# Patient Record
Sex: Female | Born: 1975 | Race: Black or African American | Hispanic: No | Marital: Single | Smoking: Never smoker
Health system: Southern US, Community
[De-identification: ages and names within clinical notes are randomized; demographics above are authoritative.]

## PROBLEM LIST (undated history)

## (undated) ENCOUNTER — Ambulatory Visit (HOSPITAL_COMMUNITY): Payer: 59

## (undated) DIAGNOSIS — F32A Depression, unspecified: Secondary | ICD-10-CM

## (undated) DIAGNOSIS — Z8659 Personal history of other mental and behavioral disorders: Secondary | ICD-10-CM

## (undated) DIAGNOSIS — I10 Essential (primary) hypertension: Secondary | ICD-10-CM

## (undated) DIAGNOSIS — M069 Rheumatoid arthritis, unspecified: Secondary | ICD-10-CM

## (undated) DIAGNOSIS — Z923 Personal history of irradiation: Secondary | ICD-10-CM

## (undated) DIAGNOSIS — R319 Hematuria, unspecified: Secondary | ICD-10-CM

## (undated) DIAGNOSIS — C50919 Malignant neoplasm of unspecified site of unspecified female breast: Secondary | ICD-10-CM

## (undated) DIAGNOSIS — F329 Major depressive disorder, single episode, unspecified: Secondary | ICD-10-CM

## (undated) DIAGNOSIS — Z8639 Personal history of other endocrine, nutritional and metabolic disease: Secondary | ICD-10-CM

## (undated) DIAGNOSIS — A6000 Herpesviral infection of urogenital system, unspecified: Secondary | ICD-10-CM

## (undated) DIAGNOSIS — F419 Anxiety disorder, unspecified: Secondary | ICD-10-CM

## (undated) DIAGNOSIS — R3915 Urgency of urination: Secondary | ICD-10-CM

## (undated) DIAGNOSIS — N200 Calculus of kidney: Secondary | ICD-10-CM

## (undated) DIAGNOSIS — G500 Trigeminal neuralgia: Secondary | ICD-10-CM

## (undated) HISTORY — DX: Personal history of other endocrine, nutritional and metabolic disease: Z86.39

## (undated) HISTORY — PX: BREAST LUMPECTOMY: SHX2

## (undated) HISTORY — PX: ANTERIOR CERVICAL DECOMP/DISCECTOMY FUSION: SHX1161

## (undated) HISTORY — DX: Herpesviral infection of urogenital system, unspecified: A60.00

## (undated) HISTORY — PX: ABDOMINAL HYSTERECTOMY: SHX81

---
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
## 1998-04-18 ENCOUNTER — Emergency Department (HOSPITAL_COMMUNITY): Admission: EM | Admit: 1998-04-18 | Discharge: 1998-04-18 | Payer: Self-pay | Admitting: Emergency Medicine

## 1999-05-19 ENCOUNTER — Other Ambulatory Visit: Admission: RE | Admit: 1999-05-19 | Discharge: 1999-05-19 | Payer: Self-pay | Admitting: Emergency Medicine

## 1999-08-18 ENCOUNTER — Encounter: Payer: Self-pay | Admitting: Emergency Medicine

## 1999-08-18 ENCOUNTER — Emergency Department (HOSPITAL_COMMUNITY): Admission: EM | Admit: 1999-08-18 | Discharge: 1999-08-18 | Payer: Self-pay | Admitting: Emergency Medicine

## 2000-01-10 ENCOUNTER — Emergency Department (HOSPITAL_COMMUNITY): Admission: EM | Admit: 2000-01-10 | Discharge: 2000-01-10 | Payer: Self-pay | Admitting: Podiatry

## 2000-02-19 ENCOUNTER — Other Ambulatory Visit: Admission: RE | Admit: 2000-02-19 | Discharge: 2000-02-19 | Payer: Self-pay | Admitting: Emergency Medicine

## 2000-04-04 ENCOUNTER — Encounter: Payer: Self-pay | Admitting: Emergency Medicine

## 2000-04-04 ENCOUNTER — Emergency Department (HOSPITAL_COMMUNITY): Admission: EM | Admit: 2000-04-04 | Discharge: 2000-04-04 | Payer: Self-pay | Admitting: Emergency Medicine

## 2000-04-05 ENCOUNTER — Encounter: Payer: Self-pay | Admitting: Emergency Medicine

## 2000-04-05 ENCOUNTER — Inpatient Hospital Stay (HOSPITAL_COMMUNITY): Admission: AD | Admit: 2000-04-05 | Discharge: 2000-04-09 | Payer: Self-pay | Admitting: Emergency Medicine

## 2001-02-03 DIAGNOSIS — Z8639 Personal history of other endocrine, nutritional and metabolic disease: Secondary | ICD-10-CM

## 2001-02-03 HISTORY — DX: Personal history of other endocrine, nutritional and metabolic disease: Z86.39

## 2001-02-20 ENCOUNTER — Ambulatory Visit (HOSPITAL_COMMUNITY): Admission: RE | Admit: 2001-02-20 | Discharge: 2001-02-20 | Payer: Self-pay | Admitting: Emergency Medicine

## 2001-02-20 ENCOUNTER — Encounter: Payer: Self-pay | Admitting: Emergency Medicine

## 2001-02-24 ENCOUNTER — Encounter: Admission: RE | Admit: 2001-02-24 | Discharge: 2001-02-24 | Payer: Self-pay | Admitting: Emergency Medicine

## 2001-02-24 ENCOUNTER — Encounter: Payer: Self-pay | Admitting: Emergency Medicine

## 2001-03-24 ENCOUNTER — Encounter: Payer: Self-pay | Admitting: Internal Medicine

## 2001-03-24 ENCOUNTER — Ambulatory Visit (HOSPITAL_COMMUNITY): Admission: RE | Admit: 2001-03-24 | Discharge: 2001-03-24 | Payer: Self-pay | Admitting: Internal Medicine

## 2001-07-01 ENCOUNTER — Other Ambulatory Visit: Admission: RE | Admit: 2001-07-01 | Discharge: 2001-07-01 | Payer: Self-pay | Admitting: Emergency Medicine

## 2004-04-17 ENCOUNTER — Emergency Department (HOSPITAL_COMMUNITY): Admission: EM | Admit: 2004-04-17 | Discharge: 2004-04-17 | Payer: Self-pay | Admitting: Emergency Medicine

## 2004-04-26 ENCOUNTER — Encounter: Admission: RE | Admit: 2004-04-26 | Discharge: 2004-04-26 | Payer: Self-pay | Admitting: Emergency Medicine

## 2005-04-27 ENCOUNTER — Emergency Department (HOSPITAL_COMMUNITY): Admission: EM | Admit: 2005-04-27 | Discharge: 2005-04-27 | Payer: Self-pay | Admitting: Emergency Medicine

## 2005-06-27 ENCOUNTER — Other Ambulatory Visit: Admission: RE | Admit: 2005-06-27 | Discharge: 2005-06-27 | Payer: Self-pay | Admitting: Obstetrics and Gynecology

## 2006-07-02 ENCOUNTER — Other Ambulatory Visit: Admission: RE | Admit: 2006-07-02 | Discharge: 2006-07-02 | Payer: Self-pay | Admitting: Obstetrics and Gynecology

## 2007-11-06 HISTORY — PX: KNEE ARTHROSCOPY: SUR90

## 2008-03-16 ENCOUNTER — Encounter: Admission: RE | Admit: 2008-03-16 | Discharge: 2008-03-16 | Payer: Self-pay | Admitting: Emergency Medicine

## 2008-04-22 ENCOUNTER — Ambulatory Visit: Payer: Self-pay | Admitting: Vascular Surgery

## 2008-04-22 ENCOUNTER — Encounter (INDEPENDENT_AMBULATORY_CARE_PROVIDER_SITE_OTHER): Payer: Self-pay | Admitting: Orthopedic Surgery

## 2008-04-22 ENCOUNTER — Ambulatory Visit: Admission: RE | Admit: 2008-04-22 | Discharge: 2008-04-22 | Payer: Self-pay | Admitting: Orthopedic Surgery

## 2010-11-26 ENCOUNTER — Encounter: Payer: Self-pay | Admitting: Emergency Medicine

## 2011-03-23 NOTE — Discharge Summary (Signed)
[place]. [facility]  Patient:    Joanne Garrett, Joanne Garrett                   MRN: [id number] Adm. Date:  [id number] Disc. Date: [id number] Attending:  Roque Lias                           Discharge Summary  SUMMARY OF HISTORY AND PHYSICAL:  The patient is a 35 year old female who had had a one-week history of left CVA pain and left flank pain which gradually became more severe.  Because of the severity of the pain, she went to the emergency room the night before admission where a chest x-ray was done which was normal.  On the day of admission, the pain was even more severe and she also noted fever, chills, nausea and hematuria.  She denied any cough, sputum production, shortness of breath, vomiting, dysuria, urinary frequency, urinary urgency, diarrhea or blood in the stool.  She had had no GYN complaints and her last menstrual period was Mar 25, 2000.  She has no prior history of urinary tract infections or stones.  PHYSICAL EXAMINATION:  Blood pressure 108/74 mmHg, pulse 126 and regular, respirations 20, and temperature 104.5 degrees.  She appeared alert and in no distress.  SKIN:  Clear.  HEENT: PERRL.  Fundi were benign.  ENT was unremarkable.  NECK:  Supple.  There was no adenopathy.  LUNGS:  Clear.  CARDIAC:  Rhythm was regular with gallop or murmur.  There was no CVA tenderness.  ABDOMEN:  She had moderate left flank tenderness on abdominal examination without guarding or rebound.  Both lower quadrants were soft and nontender. There was no hepatosplenomegaly or mass.  Bowel sounds were normally active. She had no edema.  Pulses were full.  NEUROLOGIC:  Within normal limits.  ADMITTING IMPRESSION:  Left flank pain with fever, probably acute pyelonephritis.  SUMMARY OF LABORATORY DATA:  White count 26,800, hemoglobin 11.7.  Sodium 129, potassium 3.3, chloride 95, BUN 10, creatinine 1.0.  Her UA showed too numerous to count wbcs and  rbcs, specific gravity greater than 1.040, pH 6.5, glucose negative, large hemoglobin, 30 mg/dL protein, moderate leukocyte esterase.  One of two blood cultures grew out Escherichia coli.  A urine culture was also ordered but was apparently never done.  HOSPITAL COURSE:  The patient was admitted to the 5700 floor.  Vital signs were obtained every 4 hours.  She was allowed to be up ad lib, given a clear liquid diet, and a saline lock was inserted.  An unenhanced renal CT scan was done and she was begun on Levaquin 500 mg IV once per day and was given morphine for pain and Tylenol for fever.  The following morning she felt a little better.  She had less abdominal pain.  She was tolerating clear liquids well.  Her temperature was 102.3 degrees.  IV Levaquin was continued.  Over the next several days her temperature gradually came down.  Her left CVA pain gradually improved.  She was advanced to a regular diet which she tolerated well.  As of April 08, 2000 she was advanced to p.o. Levaquin and she tolerated this as well.  As of April 09, 2000 she had minimal left CVA pain, no nausea or vomiting.  She was eating a regular diet and ambulating in the hall, tolerating p.o. Levaquin.  Her temperature was 100.9 degrees.  Lungs were clear.  Cardiac rhythm was regular without murmur.  Abdomen was soft and nontender.  Her blood culture at that time was growing out E. coli.  It was felt that she could be discharged on that date on Levaquin.  FINAL DIAGNOSES: 1.  Escherichia coli sepsis. 2.  Acute pyelonephritis. 3.  Hyponatremia. 4.  Hypokalemia.  DISCHARGE MEDICATIONS:  Levaquin 500 mg once per day.  ACTIVITY:  May return to work on April 12, 2000.  DIET:  No restrictions.  FOLLOWUP:  Return to see me in 2 weeks. DD:  04/20/00 TD:  04/23/00 Job: [id number]

## 2011-03-23 NOTE — Discharge Summary (Signed)
[place]. [facility]  Patient:    Joanne Garrett, Joanne Garrett                   MRN: [id number] Adm. Date:  [id number] Disc. Date: [id number] Attending:  Roque Lias                           Discharge Summary  HISTORY OF PRESENT ILLNESS:  The patient is a 35 year old female who had had a one-week history of left CVA and left flank pain which had gradually become more severe.  Because of the severity of the pain, she went to the emergency room last night where her chest x-ray was normal.  On the day of admission, the pain was even more severe and she noted also fever, chills, nausea, and hematuria.  She denied any urinary frequency, urgency, or dysuria.  No prior history of urinary tract infections or stones.  PHYSICAL EXAMINATION:  VITAL SIGNS:  Blood pressure 108/74, pulse 126 and regular, respirations 20, temperature 104.5.  GENERAL:  She appeared alert and was in no distress.  SKIN:  Warm and dry without rash.  HEENT:  PERL, full EOMs, fundi benign.   ENT unremarkable.  NECK:  Supple without adenopathy.  LUNGS:  Clear.  CARDIAC:  Rhythm was regular without gallop or murmur.  ABDOMEN:  She had no CVA tenderness.  She had moderate left flank tenderness without guarding or rebound.  Both lower quadrants were soft and nontender. There was no hepatosplenomegaly or mass.  Bowel sounds were present.  EXTREMITIES:  She had no edema.  Pulses were full.  Neurological exam was normal.  ADMITTING IMPRESSION:  Left flank pain with fever, probably acute pyelonephritis.  LABORATORY DATA:  CT of the abdomen and pelvis revealed no evidence of kidney stones.  There was a striated appearance of the left kidney consistent with pyelonephritis.  CT of the pelvis was normal.  Blood gases on room air revealed pH 7.449, PCO2 34.5, and PO2 94.0.  White count was 26.800, hemoglobin 11.7.  Sodium was 129, potassium 3.3, chloride 95, BUN 10, creatinine 1.0, glucose  106.  UA showed too numerous to count WBCs and RBCs.  One of two blood cultures grew out E. coli sensitive to all antibiotics tested.  A urine culture was ordered but was not done.  HOSPITAL COURSE:  The patient was hospitalized on the 5700 floor.  Vital signs were obtained every 4 hours.  She was allowed to be up ad lib, given a clear liquid diet.  Normal saline lock was inserted and she was begun on Levaquin 500 mg IV once a day.  She had little nausea and vomiting her first hospital day and was given Phenergan which did seem to help.  Over the next several days, her temperature gradually improved.  Her left flank and CVA pain also gradually improved.  As of April 09, 2000, she had minimal left CVA pain, she had no nausea or vomiting, she was tolerating a regular diet, and was up and ambulatory in the hallway.  Her temperature was 100.9, her lungs were clear, cardiac rhythm was regular without murmur, abdomen was soft and nontender, and her blood culture was growing out E. coli at that time.  It was felt that she had acute pyelonephritis with secondary sepsis and she was discharged on that date on Levaquin.  FINAL DIAGNOSES: 1. Septicemia. 2. Acute pyelonephritis.  DISCHARGE MEDICATIONS:  Levaquin 500 mg once  a day.  ACTIVITY:  May return to work April 12, 2000.  DIET:  No restrictions.  DISPOSITION:  To return to see me in two weeks for follow-up UA and culture. DD:  04/27/00 TD:  04/28/00 Job: [id number]

## 2011-03-23 NOTE — H&P (Signed)
[place]. [facility]  Patient:    Joanne Garrett, Joanne Garrett                   MRN: [id number] Adm. Date:  [id number] Disc. Date: [id number] Attending:  Sandi Raveling                         History and Physical  CHIEF COMPLAINT:  "Left flank pain."  HISTORY OF PRESENT ILLNESS:  The patient is a 35 year old female who has had a one week history of left CVA and left flank pain which has gradually become more severe.  Because of the severity of the pain, she went to the emergency room last night where a chest x-ray was normal.  Today, the pain was even mor severe and she noted also fever, chills, nausea and hematuria.  She denies any cough, sputum production, shortness of breath, vomiting, dysuria, urinary frequency, urinary urgency, diarrhea or blood in the stool.  She has had no GYN complaints, and her last menstrual period was Mar 25, 2000.  She has had no prior history of urinary tract infections or stones.  PAST MEDICAL HISTORY:  Surgeries:  None.  Other hospitalizations:  None. Other illnesses:  None.  The patients last Pap and pelvic were February 19, 2000.  MEDICATIONS:  Ortho-Novum 7/7/7.  ALLERGIES:  None.  FAMILY HISTORY:  The patients father died at age 27 of kidney failure.  Her mother has high blood pressure.  She has two brothers in good health and a sister with hypertension.  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  She does not use alcohol or tobacco.  She is single.  REVIEW OF SYSTEMS:  She denies other systemic, eye, skin, ENT, respiratory, cardiovascular, GI/GU, musculoskeletal or neurological complaints.  PHYSICAL EXAMINATION:  VITAL SIGNS:  Blood pressure 108/74, pulse 126 and regular, respirations 20, temperature 104.5.  GENERAL:  The patient appears alert and in no distress.  SKIN:  Warm and dry.  No rash.  EYES:  Pupils equal, round and reactive to light, full EOMs, fundi  benign, sclerae nonicteric.  ENT:  TMs are normal, pharynx is clear, no intraoral lesions, mucous membranes are moist.  NECK:  Supple, no adenopathy, JVD or bruit.  Thyroid normal.  LUNGS:  Clear to auscultation and percussion.  HEART:  Regular rhythm, no gallop or murmur.  BACK:  No CVA tenderness.  ABDOMEN:  The patient has moderate left flank tenderness without guarding or rebound.  Both lower quadrants were soft and nontender.  No hepatosplenomegaly or mass.  Bowel sounds are present.  EXTREMITIES:  No edema, no calf tenderness, Homans sign negative, pulses full.  NEUROLOGICAL:  Alert and oriented x 3.  Speech is clear and appropriate.  No extremity weakness or tremor.  DTRs 2+ and symmetrical.  Babinskis downgoing. Cranial nerves intact.  IMPRESSION:  Left flank pain a fever, probably acute pyelonephritis.  PLAN: 1. UA and culture and blood culture. 2. Renal CT scan. 3. IV Levaquin and IV fluids. DD:  04/05/00 TD:  04/06/00 Job: [id number]

## 2012-02-13 ENCOUNTER — Other Ambulatory Visit: Payer: Self-pay | Admitting: Obstetrics and Gynecology

## 2012-02-13 ENCOUNTER — Telehealth: Payer: Self-pay | Admitting: Obstetrics and Gynecology

## 2012-02-13 MED ORDER — SERTRALINE HCL 50 MG PO TABS: 50.0000 mg | ORAL_TABLET | Freq: Every day | ORAL | Status: DC

## 2012-02-13 NOTE — Telephone Encounter (Signed)
Pt called 02/11/12 requesting RF Zoloft.   TC to pt. LM to return call.

## 2012-02-13 NOTE — Telephone Encounter (Signed)
RX called to VM @ [place], [employer] [phone]

## 2012-02-14 ENCOUNTER — Other Ambulatory Visit: Payer: Self-pay | Admitting: Obstetrics and Gynecology

## 2012-02-14 ENCOUNTER — Telehealth: Payer: Self-pay | Admitting: Obstetrics and Gynecology

## 2012-02-14 MED ORDER — SERTRALINE HCL 50 MG PO TABS: 50.0000 mg | ORAL_TABLET | Freq: Every day | ORAL | Status: AC

## 2012-02-14 NOTE — Telephone Encounter (Signed)
TC to [phone].  Female states wrong number.  LM at [phone] to return call.  Cell # D/C'd.

## 2012-08-07 DIAGNOSIS — D0512 Intraductal carcinoma in situ of left breast: Secondary | ICD-10-CM | POA: Insufficient documentation

## 2012-11-05 HISTORY — PX: TOTAL ABDOMINAL HYSTERECTOMY W/ BILATERAL SALPINGOOPHORECTOMY: SHX83

## 2013-07-04 ENCOUNTER — Emergency Department (HOSPITAL_COMMUNITY)
Admission: EM | Admit: 2013-07-04 | Discharge: 2013-07-04 | Disposition: A | Discharge status: Home / Self Care | Payer: Commercial Managed Care - PPO | Attending: Emergency Medicine | Admitting: Emergency Medicine

## 2013-07-04 ENCOUNTER — Encounter (HOSPITAL_COMMUNITY): Payer: Self-pay | Admitting: Emergency Medicine

## 2013-07-04 DIAGNOSIS — L03116 Cellulitis of left lower limb: Secondary | ICD-10-CM

## 2013-07-04 DIAGNOSIS — L02419 Cutaneous abscess of limb, unspecified: Secondary | ICD-10-CM | POA: Insufficient documentation

## 2013-07-04 MED ORDER — CEPHALEXIN 500 MG PO CAPS: 500.0000 mg | ORAL_CAPSULE | Freq: Four times a day (QID) | ORAL | Status: DC

## 2013-07-04 NOTE — ED Provider Notes (Signed)
CSN: [id number]     Arrival date & time 07/04/13  1916 History  This chart was scribed for non-physician practitioner Fayrene Helper, [name] working with Flint Melter, MD by Valera Castle, ED scribe. This patient was seen in room TR11C/TR11C and the patient's care was started at 7:45 PM.    Chief Complaint  Patient presents with  . Leg Swelling    The history is provided by the patient. No language interpreter was used.   HPI Comments: Joanne Garrett is a 37 y.o. female who presents to the Emergency Department complaining of tight, itchy left upper thigh swelling, onset yesterday after getting Enbrel injection for rheumatoid arthritis. She gets the injection once a week, and has been for about 3 months. Pt denies previous injury. Pt denies chest pain, SOB, fever, chills. Pt denies previous h/o blood clots. Pt is allergic to Bactrim.     History reviewed. No pertinent past medical history. History reviewed. No pertinent past surgical history. No family history on file. History  Substance Use Topics  . Smoking status: Never Smoker   . Smokeless tobacco: Not on file  . Alcohol Use: No   OB History   Grav Para Term Preterm Abortions TAB SAB Ect Mult Living                 Review of Systems  Allergies  Bactrim  Home Medications  No current outpatient prescriptions on file. BP 136/74  Pulse 88  Temp(Src) 99.3 F (37.4 C) (Oral)  Resp 16  SpO2 98%  LMP 06/11/2013 Physical Exam  Nursing note and vitals reviewed. Constitutional: She is oriented to person, place, and time. She appears well-developed and well-nourished.  HENT:  Head: Normocephalic and atraumatic.  Eyes: EOM are normal.  Neck: Normal range of motion. Neck supple.  Cardiovascular: Normal rate.   Pulmonary/Chest: Effort normal.  Musculoskeletal: Normal range of motion.  Left anterior thigh has area of blanchable erythema approximately 6x5 inches with warmth without induration or fluctuance. Mild tenderness  noted.   Neurological: She is alert and oriented to person, place, and time.  Skin: Skin is warm and dry.  Psychiatric: She has a normal mood and affect. Her behavior is normal.    ED Course  Procedures (including critical care time)  DIAGNOSTIC STUDIES: Oxygen Saturation is 98% on room air, normal by my interpretation.    COORDINATION OF CARE: 7:52 PM-Although skin changes could be simple skin reaction but since pt is currently on an immunosuppressant medication will consider treating for cellulitis iwht abx.  Discussed treatment plan which includes f/u with doctor in 2 days, antibiotics with pt at bedside and pt agreed to plan. Discussed with pt there is no abscesses or reason to suspect it's a blood clot.     Labs Review Labs Reviewed - No data to display Imaging Review No results found.  MDM   1. Cellulitis of left thigh    BP 136/74  Pulse 88  Temp(Src) 99.3 F (37.4 C) (Oral)  Resp 16  SpO2 98%  LMP 06/11/2013   I personally performed the services described in this documentation, which was scribed in my presence. The recorded information has been reviewed and is accurate.      Fayrene Helper, [name] 07/04/13 [phone]

## 2013-07-04 NOTE — ED Notes (Signed)
Patient stated she received her Embrel shot on Thursday and Friday her left thigh started to swelling.  Today she went for a walk and her leg really started bothering her.  Swelling marked.  Has been using ice but has not noticed any change.   Discharge instructions given  Voiced understanding.

## 2013-07-04 NOTE — ED Provider Notes (Signed)
Medical screening examination/treatment/procedure(s) were performed by non-physician practitioner and as supervising physician I was immediately available for consultation/collaboration.  Adelaida Reindel L Claudius Mich, MD 07/04/13 2317 

## 2013-07-04 NOTE — ED Notes (Signed)
Pt. Reports left upper thigh swelling onset Friday , denies injury /ambulatory , pt. Thinks its from her Enbrel medication for rheumatoid arthritis , respirations unlabored , denies fever or chills.

## 2013-10-14 ENCOUNTER — Encounter (HOSPITAL_COMMUNITY): Payer: Self-pay | Admitting: Emergency Medicine

## 2013-10-14 ENCOUNTER — Emergency Department (HOSPITAL_COMMUNITY)
Admission: EM | Admit: 2013-10-14 | Discharge: 2013-10-14 | Disposition: A | Discharge status: Home / Self Care | Payer: Commercial Managed Care - PPO | Attending: Emergency Medicine | Admitting: Emergency Medicine

## 2013-10-14 DIAGNOSIS — Z853 Personal history of malignant neoplasm of breast: Secondary | ICD-10-CM | POA: Insufficient documentation

## 2013-10-14 DIAGNOSIS — I10 Essential (primary) hypertension: Secondary | ICD-10-CM | POA: Insufficient documentation

## 2013-10-14 DIAGNOSIS — M069 Rheumatoid arthritis, unspecified: Secondary | ICD-10-CM | POA: Insufficient documentation

## 2013-10-14 DIAGNOSIS — Z79899 Other long term (current) drug therapy: Secondary | ICD-10-CM | POA: Insufficient documentation

## 2013-10-14 HISTORY — DX: Rheumatoid arthritis, unspecified: M06.9

## 2013-10-14 HISTORY — DX: Malignant neoplasm of unspecified site of unspecified female breast: C50.919

## 2013-10-14 HISTORY — DX: Essential (primary) hypertension: I10

## 2013-10-14 MED ORDER — DEXAMETHASONE SODIUM PHOSPHATE 10 MG/ML IJ SOLN
10.0000 mg | Freq: Once | INTRAMUSCULAR | Status: AC
  Administered 2013-10-14: 10 mg via INTRAMUSCULAR
  Filled 2013-10-14: qty 1

## 2013-10-14 MED ORDER — PREDNISONE 50 MG PO TABS: 50.0000 mg | ORAL_TABLET | Freq: Every day | ORAL | Status: DC

## 2013-10-14 MED ORDER — METOCLOPRAMIDE HCL 5 MG/ML IJ SOLN
10.0000 mg | Freq: Once | INTRAMUSCULAR | Status: AC
  Administered 2013-10-14: 10 mg via INTRAMUSCULAR
  Filled 2013-10-14: qty 2

## 2013-10-14 NOTE — ED Provider Notes (Signed)
CSN: [id number]     Arrival date & time 10/14/13  4098 History   First MD Initiated Contact with Patient 10/14/13 [phone]     Chief Complaint  Patient presents with  . Hand Pain   (Consider location/radiation/quality/duration/timing/severity/associated sxs/prior Treatment) HPI Patient is a 37 year old female with a PMH of RA, HTN, and breast cancer who presents to the Emergency Department complaining of left hand and forearm pain x 2-3 days. Patient states that she has felt achy all over for the past few days, but 2-3 days ago, she started getting a throbbing pain in her left hand and forearm. She states this pain is similar to her previous RA flairs. Patient feels the weather has contributed to her increased pain. She is currently on Enbrel and Methotrexate for her pain, but reports that she was off of these medications for 4 weeks in September for surgery. She has tried ibuprofen for the acute pain with no relief. Her rheumatologist is Dr. Cardell Peach in [facility]. Her next appointment with him is 12/03/13. She denies injury to the hand and forearm. She denies fever, chills, chest pain, SOB, dizziness, nausea, and vomiting. Patient reports when she has an RA flare, she is usually given prednisone, a steroid shot, and Reglan.   Past Medical History  Diagnosis Date  . Rheumatoid arthritis   . Hypertension   . Breast cancer     2013; in remission as of 11/13   Past Surgical History  Procedure Laterality Date  . Left knee surgery    . Abdominal hysterectomy    . Breast lumpectomy     No family history on file. History  Substance Use Topics  . Smoking status: Never Smoker   . Smokeless tobacco: Not on file  . Alcohol Use: No   OB History   Grav Para Term Preterm Abortions TAB SAB Ect Mult Living                 Review of Systems  Allergies  Bactrim  Home Medications   Current Outpatient Rx  Name  Route  Sig  Dispense  Refill  . amLODipine (NORVASC) 10 MG tablet   Oral   Take 10 mg  by mouth daily.         Marland Kitchen etanercept (ENBREL) 50 MG/ML injection   Subcutaneous   Inject 50 mg into the skin once a week.         . folic acid (FOLVITE) 1 MG tablet   Oral   Take 1 mg by mouth daily.         . methotrexate (50 MG/ML) 1 G injection   Intravenous   Inject 20 mg into the vein once a week.         . sertraline (ZOLOFT) 100 MG tablet   Oral   Take 100 mg by mouth daily.          BP 124/90  Pulse 73  Temp(Src) 97.9 F (36.6 C) (Oral)  Ht 5\' 3"  (1.6 m)  Wt 170 lb (77.111 kg)  BMI 30.12 kg/m2  SpO2 96%  LMP 06/11/2013 Physical Exam  Constitutional: She is oriented to person, place, and time. She appears well-developed and well-nourished. No distress.  HENT:  Head: Normocephalic and atraumatic.  Cardiovascular: Normal rate, regular rhythm and normal heart sounds.  Exam reveals no gallop and no friction rub.   No murmur heard. Pulmonary/Chest: Breath sounds normal. No respiratory distress. She has no wheezes. She has no rales.  Musculoskeletal:       Left forearm: She exhibits no tenderness and no swelling.       Left hand: She exhibits decreased range of motion, tenderness and swelling. She exhibits no deformity. Normal sensation noted.       Hands: 2+ left radial pulse.  Neurological: She is alert and oriented to person, place, and time.  Skin: Skin is warm and dry.    ED Course  Procedures (including critical care time) This is referred back to her rheumatologist for further evaluation.  She is told to return here as needed   Carlyle Dolly, [name] 10/14/13 1540

## 2013-10-14 NOTE — ED Notes (Signed)
Pt states she has a hx of RA, pt states she taken Enbrel &,Otrexup on a weekly basis, pt states this usually manages her RA and she does not frequently have flare-ups. Pt states she first noticed increased swelling in her left hand yesterday mid-morning, pt states the pain and swelling got worse throughout the day. Pt states she has been taking IBprofen but it has not helped. Pt is A&O X4.

## 2013-10-15 NOTE — ED Provider Notes (Signed)
Medical screening examination/treatment/procedure(s) were performed by non-physician practitioner and as supervising physician I was immediately available for consultation/collaboration.   Monique Gift L Maude Gloor, MD 10/15/13 1313 

## 2014-04-03 ENCOUNTER — Emergency Department (HOSPITAL_COMMUNITY)
Admission: EM | Admit: 2014-04-03 | Discharge: 2014-04-03 | Disposition: A | Discharge status: Home / Self Care | Payer: 59 | Attending: Emergency Medicine | Admitting: Emergency Medicine

## 2014-04-03 ENCOUNTER — Encounter (HOSPITAL_COMMUNITY): Payer: Self-pay | Admitting: Emergency Medicine

## 2014-04-03 ENCOUNTER — Emergency Department (HOSPITAL_COMMUNITY): Payer: 59

## 2014-04-03 DIAGNOSIS — M25561 Pain in right knee: Secondary | ICD-10-CM

## 2014-04-03 DIAGNOSIS — M79609 Pain in unspecified limb: Secondary | ICD-10-CM | POA: Insufficient documentation

## 2014-04-03 DIAGNOSIS — Z9889 Other specified postprocedural states: Secondary | ICD-10-CM | POA: Insufficient documentation

## 2014-04-03 DIAGNOSIS — Z79899 Other long term (current) drug therapy: Secondary | ICD-10-CM | POA: Insufficient documentation

## 2014-04-03 DIAGNOSIS — M069 Rheumatoid arthritis, unspecified: Secondary | ICD-10-CM | POA: Insufficient documentation

## 2014-04-03 DIAGNOSIS — Z853 Personal history of malignant neoplasm of breast: Secondary | ICD-10-CM | POA: Insufficient documentation

## 2014-04-03 DIAGNOSIS — I10 Essential (primary) hypertension: Secondary | ICD-10-CM | POA: Insufficient documentation

## 2014-04-03 DIAGNOSIS — IMO0002 Reserved for concepts with insufficient information to code with codable children: Secondary | ICD-10-CM | POA: Insufficient documentation

## 2014-04-03 MED ORDER — OXYCODONE-ACETAMINOPHEN 5-325 MG PO TABS
1.0000 | ORAL_TABLET | Freq: Once | ORAL | Status: AC
  Administered 2014-04-03: 1 via ORAL
  Filled 2014-04-03: qty 1

## 2014-04-03 MED ORDER — OXYCODONE-ACETAMINOPHEN 5-325 MG PO TABS: 2.0000 | ORAL_TABLET | ORAL | Status: DC | PRN

## 2014-04-03 NOTE — ED Notes (Signed)
Pt reports RT knee pain started on Friday. Pt is currently taking a dose pack for RS in hands. Pt works at [employer].

## 2014-04-03 NOTE — Discharge Instructions (Signed)
Use Ibuprofen for pain - this will help with inflammation and swelling  Take percocet as needed for severe pain - Please be careful with this medication.  It can cause drowsiness.  Use caution while driving, operating machinery, drinking alcohol, or any other activities that may impair your physical or mental abilities.   Use RICE method - see below Return to the emergency department if you develop any changing/worsening condition, leg swelling, fever, weakness, loss of sensation, or any other concerns (please read additional information regarding your condition below)     Knee Pain Knee pain can be a result of an injury or other medical conditions. Treatment will depend on the cause of your pain. HOME CARE  Only take medicine as told by your doctor.  Keep a healthy weight. Being overweight can make the knee hurt more.  Stretch before exercising or playing sports.  If there is constant knee pain, change the way you exercise. Ask your doctor for advice.  Make sure shoes fit well. Choose the right shoe for the sport or activity.  Protect your knees. Wear kneepads if needed.  Rest when you are tired. GET HELP RIGHT AWAY IF:   Your knee pain does not stop.  Your knee pain does not get better.  Your knee joint feels hot to the touch.  You have a fever. MAKE SURE YOU:   Understand these instructions.  Will watch this condition.  Will get help right away if you are not doing well or get worse. Document Released: 01/18/2009 Document Revised: 01/14/2012 Document Reviewed: 01/18/2009 [facility] Patient Information 2014 [place], [place].  RICE: Routine Care for Injuries The routine care of many injuries includes Rest, Ice, Compression, and Elevation (RICE). HOME CARE INSTRUCTIONS  Rest is needed to allow your body to heal. Routine activities can usually be resumed when comfortable. Injured tendons and bones can take up to 6 weeks to heal. Tendons are the cord-like structures that  attach muscle to bone.  Ice following an injury helps keep the swelling down and reduces pain.  Put ice in a plastic bag.  Place a towel between your skin and the bag.  Leave the ice on for 15-20 minutes, 03-04 times a day. Do this while awake, for the first 24 to 48 hours. After that, continue as directed by your caregiver.  Compression helps keep swelling down. It also gives support and helps with discomfort. If an elastic bandage has been applied, it should be removed and reapplied every 3 to 4 hours. It should not be applied tightly, but firmly enough to keep swelling down. Watch fingers or toes for swelling, bluish discoloration, coldness, numbness, or excessive pain. If any of these problems occur, remove the bandage and reapply loosely. Contact your caregiver if these problems continue.  Elevation helps reduce swelling and decreases pain. With extremities, such as the arms, hands, legs, and feet, the injured area should be placed near or above the level of the heart, if possible. SEEK IMMEDIATE MEDICAL CARE IF:  You have persistent pain and swelling.  You develop redness, numbness, or unexpected weakness.  Your symptoms are getting worse rather than improving after several days. These symptoms may indicate that further evaluation or further X-rays are needed. Sometimes, X-rays may not show a small broken bone (fracture) until 1 week or 10 days later. Make a follow-up appointment with your caregiver. Ask when your X-ray results will be ready. Make sure you get your X-ray results. Document Released: 02/03/2001 Document Revised: 01/14/2012 Document Reviewed:  03/23/2011 [employer] Patient Information 2014 [place].

## 2014-04-03 NOTE — ED Provider Notes (Signed)
Medical screening examination/treatment/procedure(s) were performed by non-physician practitioner and as supervising physician I was immediately available for consultation/collaboration.   EKG Interpretation None        [place], DO 04/03/14 [phone]

## 2014-04-03 NOTE — ED Provider Notes (Signed)
CSN: [id number]     Arrival date & time 04/03/14  0810 History   None    Chief Complaint  Patient presents with  . Knee Pain   HPI  Joanne Garrett is a 38 y.o. female with a PMH of RA, HTN and breast cancer (remission 2013) who presents to the ED for evaluation of knee pain and leg swelling. History was provided by the patient. Patient has had right knee pain which began gradually yesterday. Pain is located in the in the posterior knee which radiates around to the lower front of her knee. Paint is described as a constant sharp pain which is worse with movement. Nothing relieves the pain. She has tried Ibuprofen with no relief. Patient has a hx of RA and is currently on prednisone for her hands. She has chronic arthralgias in her hands, shoulders, and hips but denies any hx of arthritis in her knees. Patient denies any injuries or trauma. Denies any leg swelling, weakness, loss of sensation, numbness or tingling. Has otherwise been well with no fever, chills, change in appetite/activity. No history of DVT, PE, clotting disorder, recent surgery, immobilization, travel, or estrogen supplementation.     Past Medical History  Diagnosis Date  . Rheumatoid arthritis   . Hypertension   . Breast cancer     2013; in remission as of 11/13   Past Surgical History  Procedure Laterality Date  . Left knee surgery    . Abdominal hysterectomy    . Breast lumpectomy     History reviewed. No pertinent family history. History  Substance Use Topics  . Smoking status: Never Smoker   . Smokeless tobacco: Not on file  . Alcohol Use: No   OB History   Grav Para Term Preterm Abortions TAB SAB Ect Mult Living                 Review of Systems  Constitutional: Negative for fever, chills, activity change, appetite change and fatigue.  Cardiovascular: Negative for leg swelling.  Musculoskeletal: Positive for arthralgias and gait problem (due to pain). Negative for back pain, joint swelling, myalgias and  neck pain.  Skin: Negative for wound.  Neurological: Negative for weakness and numbness.    Allergies  Bactrim  Home Medications   Prior to Admission medications   Medication Sig Start Date End Date Taking? Authorizing Provider  amLODipine (NORVASC) 10 MG tablet Take 10 mg by mouth daily.    Historical Provider, MD  etanercept (ENBREL) 50 MG/ML injection Inject 50 mg into the skin once a week.    Historical Provider, MD  folic acid (FOLVITE) 1 MG tablet Take 1 mg by mouth daily.    Historical Provider, MD  methotrexate (50 MG/ML) 1 G injection Inject 20 mg into the vein once a week.    Historical Provider, MD  predniSONE (DELTASONE) 50 MG tablet Take 1 tablet (50 mg total) by mouth daily. 10/14/13   Resa Miner Lawyer, [name]  sertraline (ZOLOFT) 100 MG tablet Take 100 mg by mouth daily.    Historical Provider, MD   BP 110/80  Pulse 73  Temp(Src) 98.2 F (36.8 C)  Resp 20  Ht 5\' 3"  (1.6 m)  Wt 188 lb (85.276 kg)  BMI 33.31 kg/m2  SpO2 98%  LMP 06/11/2013  Filed Vitals:   04/03/14 0824 04/03/14 0829  BP: 110/80   Pulse: 73   Temp:  98.2 F (36.8 C)  Resp: 20   Height: 5\' 3"  (1.6 m)   Weight:  188 lb (85.276 kg)   SpO2: 98% 98%    Physical Exam  Nursing note and vitals reviewed. Constitutional: She is oriented to person, place, and time. She appears well-developed and well-nourished. No distress.  HENT:  Head: Normocephalic.  Eyes: Conjunctivae are normal. Right eye exhibits no discharge. Left eye exhibits no discharge.  Neck: Normal range of motion. Neck supple.  Cardiovascular: Normal rate.  Exam reveals no gallop and no friction rub.   No murmur heard. Dorsalis pedis pulses present and equal bilaterally  Pulmonary/Chest: Effort normal.  Abdominal: Soft.  Musculoskeletal: Normal range of motion. She exhibits no edema and no tenderness.       Legs: Tenderness to palpation to the right posterior knee and mild tenderness to the inferior joint lines of the right  knee. No palpable masses. Pain worse with ROM, which is limited due to pain. No edema, joint effusion, or erythema to the right knee. No hip, calf, ankle or foot tenderness. No LE edema. Patient able to ambulate without difficulty or ataxia, however, has antalgic gait.   Neurological: She is alert and oriented to person, place, and time.  Sensation intact in the LE  Skin: Skin is warm and dry. She is not diaphoretic.    ED Course  Procedures (including critical care time) Labs Review Labs Reviewed - No data to display  Imaging Review Dg Knee Complete 4 Views Right  04/03/2014   CLINICAL DATA:  Knee pain without injury  EXAM: RIGHT KNEE - COMPLETE 4+ VIEW  COMPARISON:  None.  FINDINGS: There is no evidence of fracture, dislocation, or joint effusion. There is no evidence of arthropathy or other focal bone abnormality. Soft tissues are unremarkable.  IMPRESSION: No acute abnormality noted.   Electronically Signed   By: Inez Catalina M.D.   On: 04/03/2014 09:34     EKG Interpretation None      MDM   Joanne Garrett is a 38 y.o. female with a PMH of RA, HTN and breast cancer (remission 2013) who presents to the ED for evaluation of knee pain and leg swelling. Etiology of knee pain possibly due to baker's cyst vs arthritis. Doubt DVT or other vascular pathology. Patient neurovascularly intact. X-rays negative for fracture or malalignment. Patient given knee sleeve. Has cane which she uses for her hip arthritis. RICE method discussed with patient. Return precautions, discharge instructions, and follow-up was discussed with the patient before discharge.      Discharge Medication List as of 04/03/2014  9:59 AM    START taking these medications   Details  oxyCODONE-acetaminophen (PERCOCET/ROXICET) 5-325 MG per tablet Take 2 tablets by mouth every 4 (four) hours as needed for severe pain., Starting 04/03/2014, Until Discontinued, Print         Final impressions: 1. Knee pain, right        Harold Hedge [place], [name] 04/03/14 1455

## 2014-05-26 DIAGNOSIS — Z79899 Other long term (current) drug therapy: Secondary | ICD-10-CM | POA: Insufficient documentation

## 2014-08-30 ENCOUNTER — Emergency Department (HOSPITAL_COMMUNITY)
Admission: EM | Admit: 2014-08-30 | Discharge: 2014-08-30 | Disposition: A | Discharge status: Home / Self Care | Payer: 59 | Attending: Emergency Medicine | Admitting: Emergency Medicine

## 2014-08-30 ENCOUNTER — Encounter (HOSPITAL_COMMUNITY): Payer: Self-pay | Admitting: Emergency Medicine

## 2014-08-30 DIAGNOSIS — M069 Rheumatoid arthritis, unspecified: Secondary | ICD-10-CM | POA: Insufficient documentation

## 2014-08-30 DIAGNOSIS — Z79899 Other long term (current) drug therapy: Secondary | ICD-10-CM | POA: Insufficient documentation

## 2014-08-30 DIAGNOSIS — M25511 Pain in right shoulder: Secondary | ICD-10-CM | POA: Diagnosis present

## 2014-08-30 DIAGNOSIS — Z791 Long term (current) use of non-steroidal anti-inflammatories (NSAID): Secondary | ICD-10-CM | POA: Insufficient documentation

## 2014-08-30 DIAGNOSIS — I10 Essential (primary) hypertension: Secondary | ICD-10-CM | POA: Diagnosis not present

## 2014-08-30 DIAGNOSIS — M7521 Bicipital tendinitis, right shoulder: Secondary | ICD-10-CM

## 2014-08-30 DIAGNOSIS — Z853 Personal history of malignant neoplasm of breast: Secondary | ICD-10-CM | POA: Diagnosis not present

## 2014-08-30 NOTE — ED Notes (Signed)
Pt presents with Right shoulder pain x5 days, pt states the pain is in the joint of her shoulder and radiates down, pt states "it's painful to lift her shoulder." pt has been a Prednisone dosing pack and is currently on day 5 of 6, followed up with her PCP today, they did an XR and reported it looked ok and was given a Toradol injection. Pt denies any relief with Prednisone, Ibuprofen, and Toradol injection

## 2014-08-30 NOTE — Discharge Instructions (Signed)
Your shoulder pain seems to be related to biceps tendonitis, but could just be an RA flare. Continue using ibuprofen scheduled every 8 hours, and use tylenol as needed for additional breakthrough pain. Continue using your prednisone as directed. Use heat for 20 minutes every hour, at least 4 times daily. Perform gentle range of motion exercises 4 times per day to help keep your shoulder from becoming frozen and more painful. See your regular doctor/rheumatologist in 3 days for re-check of pain. Your blood pressure was also noted to be high, which is likely due to your pain as well as not taking your medications today. Monitor your BP every morning and evening after a 5 minute rest period, and keep track of it for when you follow up with your regular doctor. Return to the ER for changes or worsening symptoms.    Bicipital Tendonitis Bicipital tendonitis refers to redness, soreness, and swelling (inflammation) or irritation of the bicep tendon. The biceps muscle is located between the elbow and shoulder of the inner arm. The tendon heads, similar to pieces of rope, connect the bicep muscle to the shoulder socket. They are called short head and long head tendons. When tendonitis occurs, the long head tendon is inflamed and swollen, and may be thickened or partially torn.  Bicipital tendonitis can occur with other problems as well, such as arthritis in the shoulder or acromioclavicular joints, tears in the tendons, or other rotator cuff problems.  CAUSES  Overuse of of the arms for overhead activities is the major cause of tendonitis. Many athletes, such as swimmers, baseball players, and tennis players are prone to bicipital tendonitis. Jobs that require manual labor or routine chores, especially chores involving overhead activities can result in overuse and tendonitis. SYMPTOMS Symptoms may include:  Pain in and around the front of the shoulder. Pain may be worse with overhead motion.  Pain or aching that  radiates down the arm.  Clicking or shifting sensations in the shoulder. DIAGNOSIS Your caregiver may perform the following:  Physical exam and tests of the biceps and shoulder to observe range of motion, strength, and stability.  X-rays or magnetic resonance imaging (MRI) to confirm the diagnosis. In most common cases, these tests are not necessary. Since other problems may exist in the shoulder or rotator cuff, additional tests may be recommended. TREATMENT Treatment may include the following:  Medications  Your caregiver may prescribe over-the-counter pain relievers.  Steroid injections, such as cortisone, may be recommended. These may help to reduce inflammation and pain.  Physical Therapy - Your caregiver may recommend gentle exercises with the arm. These can help restore strength and range of motion. They may be done at home or with a physical therapist's supervision and input.  Surgery - Arthroscopic or open surgery sometimes is necessary. Surgery may include:  Reattachment or repair of the tendon at the shoulder socket.  Removal of the damaged section of the tendon.  Anchoring the tendon to a different area of the shoulder (tenodesis). HOME CARE INSTRUCTIONS   Avoid overhead motion of the affected arm or any other motion that causes pain.  Take medication for pain as directed. Do not take these for more than 3 weeks, unless directed to do so by your caregiver.  Ice the affected area for 20 minutes at a time, 3-4 times per day. Place a towel on the skin over the painful area and the ice or cold pack over the towel. Do not place ice directly on the skin.  Perform  gentle exercises at home as directed. These will increase strength and flexibility. PREVENTION  Modify your activities as much as possible to protect your arm. A physical therapist or sports medicine physician can help you understand options for safe motion.  Avoid repetitive overhead pulling, lifting,  reaching, and throwing until your caregiver tells you it is ok to resume these activities. SEEK MEDICAL CARE IF:  Your pain worsens.  You have difficulty moving the affected arm.  You have trouble performing any of the self-care instructions. MAKE SURE YOU:   Understand these instructions.  Will watch your condition.  Will get help right away if you are not doing well or get worse. Document Released: 11/24/2010 Document Revised: 01/14/2012 Document Reviewed: 11/24/2010 [facility] Patient Information 2015 [place], [place]. This information is not intended to replace advice given to you by your health care provider. Make sure you discuss any questions you have with your health care provider.  Arthralgia Arthralgia is joint pain. A joint is a place where two bones meet. Joint pain can happen for many reasons. The joint can be bruised, stiff, infected, or weak from aging. Pain usually goes away after resting and taking medicine for soreness.  HOME CARE  Rest the joint as told by your doctor.  Keep the sore joint raised (elevated) for the first 24 hours.  Put ice on the joint area.  Put ice in a plastic bag.  Place a towel between your skin and the bag.  Leave the ice on for 15-20 minutes, 03-04 times a day.  Wear your splint, casting, elastic bandage, or sling as told by your doctor.  Only take medicine as told by your doctor. Do not take aspirin.  Use crutches as told by your doctor. Do not put weight on the joint until told to by your doctor. GET HELP RIGHT AWAY IF:   You have bruising, puffiness (swelling), or more pain.  Your fingers or toes turn blue or start to lose feeling (numb).  Your medicine does not lessen the pain.  Your pain becomes severe.  You have a temperature by mouth above 102 F (38.9 C), not controlled by medicine.  You cannot move or use the joint. MAKE SURE YOU:   Understand these instructions.  Will watch your condition.  Will get help  right away if you are not doing well or get worse. Document Released: 10/10/2009 Document Revised: 01/14/2012 Document Reviewed: 10/10/2009 [facility] Patient Information 2015 [place], [place]. This information is not intended to replace advice given to you by your health care provider. Make sure you discuss any questions you have with your health care provider.  Heat Therapy Heat therapy can help make painful, stiff muscles and joints feel better. Do not use heat on new injuries. Wait at least 48 hours after an injury to use heat. Do not use heat when you have aches or pains right after an activity. If you still have pain 3 hours after stopping the activity, then you may use heat. HOME CARE Wet heat pack  Soak a clean towel in warm water. Squeeze out the extra water.  Put the warm, wet towel in a plastic bag.  Place a thin, dry towel between your skin and the bag.  Put the heat pack on the area for 5 minutes, and check your skin. Your skin may be pink, but it should not be red.  Leave the heat pack on the area for 15 to 30 minutes.  Repeat this every 2 to 4 hours  while awake. Do not use heat while you are sleeping. Warm water bath  Fill a tub with warm water.  Place the affected body part in the tub.  Soak the area for 20 to 40 minutes.  Repeat as needed. Hot water bottle  Fill the water bottle half full with hot water.  Press out the extra air. Close the cap tightly.  Place a dry towel between your skin and the bottle.  Put the bottle on the area for 5 minutes, and check your skin. Your skin may be pink, but it should not be red.  Leave the bottle on the area for 15 to 30 minutes.  Repeat this every 2 to 4 hours while awake. Electric heating pad  Place a dry towel between your skin and the heating pad.  Set the heating pad on low heat.  Put the heating pad on the area for 10 minutes, and check your skin. Your skin may be pink, but it should not be red.  Leave the  heating pad on the area for 20 to 40 minutes.  Repeat this every 2 to 4 hours while awake.  Do not lie on the heating pad.  Do not fall asleep while using the heating pad.  Do not use the heating pad near water. GET HELP RIGHT AWAY IF:  You get blisters or red skin.  Your skin is puffy (swollen), or you lose feeling (numbness) in the affected area.  You have any new problems.  Your problems are getting worse.  You have any questions or concerns. If you have any problems, stop using heat therapy until you see your doctor. MAKE SURE YOU:  Understand these instructions.  Will watch your condition.  Will get help right away if you are not doing well or get worse. Document Released: 01/14/2012 Document Reviewed: 12/15/2013 [facility] Patient Information 2015 [place]. This information is not intended to replace advice given to you by your health care provider. Make sure you discuss any questions you have with your health care provider.  Shoulder Exercises EXERCISES  RANGE OF MOTION (ROM) AND STRETCHING EXERCISES These exercises may help you when beginning to rehabilitate your injury. Your symptoms may resolve with or without further involvement from your physician, physical therapist or athletic trainer. While completing these exercises, remember:   Restoring tissue flexibility helps normal motion to return to the joints. This allows healthier, less painful movement and activity.  An effective stretch should be held for at least 30 seconds.  A stretch should never be painful. You should only feel a gentle lengthening or release in the stretched tissue. ROM - Pendulum  Bend at the waist so that your right / left arm falls away from your body. Support yourself with your opposite hand on a solid surface, such as a table or a countertop.  Your right / left arm should be perpendicular to the ground. If it is not perpendicular, you need to lean over farther. Relax the muscles  in your right / left arm and shoulder as much as possible.  Gently sway your hips and trunk so they move your right / left arm without any use of your right / left shoulder muscles.  Progress your movements so that your right / left arm moves side to side, then forward and backward, and finally, both clockwise and counterclockwise.  Complete __________ repetitions in each direction. Many people use this exercise to relieve discomfort in their shoulder as well as to gain range of  motion. Repeat __________ times. Complete this exercise __________ times per day. STRETCH - Flexion, Standing  Stand with good posture. With an underhand grip on your right / left hand and an overhand grip on the opposite hand, grasp a broomstick or cane so that your hands are a little more than shoulder-width apart.  Keeping your right / left elbow straight and shoulder muscles relaxed, push the stick with your opposite hand to raise your right / left arm in front of your body and then overhead. Raise your arm until you feel a stretch in your right / left shoulder, but before you have increased shoulder pain.  Try to avoid shrugging your right / left shoulder as your arm rises by keeping your shoulder blade tucked down and toward your mid-back spine. Hold __________ seconds.  Slowly return to the starting position. Repeat __________ times. Complete this exercise __________ times per day. STRETCH - Internal Rotation  Place your right / left hand behind your back, palm-up.  Throw a towel or belt over your opposite shoulder. Grasp the towel/belt with your right / left hand.  While keeping an upright posture, gently pull up on the towel/belt until you feel a stretch in the front of your right / left shoulder.  Avoid shrugging your right / left shoulder as your arm rises by keeping your shoulder blade tucked down and toward your mid-back spine.  Hold __________. Release the stretch by lowering your opposite  hand. Repeat __________ times. Complete this exercise __________ times per day. STRETCH - External Rotation and Abduction  Stagger your stance through a doorframe. It does not matter which foot is forward.  As instructed by your physician, physical therapist or athletic trainer, place your hands:  And forearms above your head and on the door frame.  And forearms at head-height and on the door frame.  At elbow-height and on the door frame.  Keeping your head and chest upright and your stomach muscles tight to prevent over-extending your low-back, slowly shift your weight onto your front foot until you feel a stretch across your chest and/or in the front of your shoulders.  Hold __________ seconds. Shift your weight to your back foot to release the stretch. Repeat __________ times. Complete this stretch __________ times per day.  STRENGTHENING EXERCISES  These exercises may help you when beginning to rehabilitate your injury. They may resolve your symptoms with or without further involvement from your physician, physical therapist or athletic trainer. While completing these exercises, remember:   Muscles can gain both the endurance and the strength needed for everyday activities through controlled exercises.  Complete these exercises as instructed by your physician, physical therapist or athletic trainer. Progress the resistance and repetitions only as guided.  You may experience muscle soreness or fatigue, but the pain or discomfort you are trying to eliminate should never worsen during these exercises. If this pain does worsen, stop and make certain you are following the directions exactly. If the pain is still present after adjustments, discontinue the exercise until you can discuss the trouble with your clinician.  If advised by your physician, during your recovery, avoid activity or exercises which involve actions that place your right / left hand or elbow above your head or behind your  back or head. These positions stress the tissues which are trying to heal. STRENGTH - Scapular Depression and Adduction  With good posture, sit on a firm chair. Supported your arms in front of you with pillows, arm rests or a  table top. Have your elbows in line with the sides of your body.  Gently draw your shoulder blades down and toward your mid-back spine. Gradually increase the tension without tensing the muscles along the top of your shoulders and the back of your neck.  Hold for __________ seconds. Slowly release the tension and relax your muscles completely before completing the next repetition.  After you have practiced this exercise, remove the arm support and complete it in standing as well as sitting. Repeat __________ times. Complete this exercise __________ times per day.  STRENGTH - External Rotators  Secure a rubber exercise band/tubing to a fixed object so that it is at the same height as your right / left elbow when you are standing or sitting on a firm surface.  Stand or sit so that the secured exercise band/tubing is at your side that is not injured.  Bend your elbow 90 degrees. Place a folded towel or small pillow under your right / left arm so that your elbow is a few inches away from your side.  Keeping the tension on the exercise band/tubing, pull it away from your body, as if pivoting on your elbow. Be sure to keep your body steady so that the movement is only coming from your shoulder rotating.  Hold __________ seconds. Release the tension in a controlled manner as you return to the starting position. Repeat __________ times. Complete this exercise __________ times per day.  STRENGTH - Supraspinatus  Stand or sit with good posture. Grasp a __________ weight or an exercise band/tubing so that your hand is "thumbs-up," like when you shake hands.  Slowly lift your right / left hand from your thigh into the air, traveling about 30 degrees from straight out at your  side. Lift your hand to shoulder height or as far as you can without increasing any shoulder pain. Initially, many people do not lift their hands above shoulder height.  Avoid shrugging your right / left shoulder as your arm rises by keeping your shoulder blade tucked down and toward your mid-back spine.  Hold for __________ seconds. Control the descent of your hand as you slowly return to your starting position. Repeat __________ times. Complete this exercise __________ times per day.  STRENGTH - Shoulder Extensors  Secure a rubber exercise band/tubing so that it is at the height of your shoulders when you are either standing or sitting on a firm arm-less chair.  With a thumbs-up grip, grasp an end of the band/tubing in each hand. Straighten your elbows and lift your hands straight in front of you at shoulder height. Step back away from the secured end of band/tubing until it becomes tense.  Squeezing your shoulder blades together, pull your hands down to the sides of your thighs. Do not allow your hands to go behind you.  Hold for __________ seconds. Slowly ease the tension on the band/tubing as you reverse the directions and return to the starting position. Repeat __________ times. Complete this exercise __________ times per day.  STRENGTH - Scapular Retractors  Secure a rubber exercise band/tubing so that it is at the height of your shoulders when you are either standing or sitting on a firm arm-less chair.  With a palm-down grip, grasp an end of the band/tubing in each hand. Straighten your elbows and lift your hands straight in front of you at shoulder height. Step back away from the secured end of band/tubing until it becomes tense.  Squeezing your shoulder blades together, draw your  elbows back as you bend them. Keep your upper arm lifted away from your body throughout the exercise.  Hold __________ seconds. Slowly ease the tension on the band/tubing as you reverse the directions and  return to the starting position. Repeat __________ times. Complete this exercise __________ times per day. STRENGTH - Scapular Depressors  Find a sturdy chair without wheels, such as a from a dining room table.  Keeping your feet on the floor, lift your bottom from the seat and lock your elbows.  Keeping your elbows straight, allow gravity to pull your body weight down. Your shoulders will rise toward your ears.  Raise your body against gravity by drawing your shoulder blades down your back, shortening the distance between your shoulders and ears. Although your feet should always maintain contact with the floor, your feet should progressively support less body weight as you get stronger.  Hold __________ seconds. In a controlled and slow manner, lower your body weight to begin the next repetition. Repeat __________ times. Complete this exercise __________ times per day.  Document Released: 09/05/2005 Document Revised: 01/14/2012 Document Reviewed: 02/03/2009 [facility] Patient Information 2015 [place], [place]. This information is not intended to replace advice given to you by your health care provider. Make sure you discuss any questions you have with your health care provider.  Hypertension Hypertension is another name for high blood pressure. High blood pressure forces your heart to work harder to pump blood. A blood pressure reading has two numbers, which includes a higher number over a lower number (example: 110/72). HOME CARE   Have your blood pressure rechecked by your doctor.  Only take medicine as told by your doctor. Follow the directions carefully. The medicine does not work as well if you skip doses. Skipping doses also puts you at risk for problems.  Do not smoke.  Monitor your blood pressure at home as told by your doctor. GET HELP IF:  You think you are having a reaction to the medicine you are taking.  You have repeat headaches or feel dizzy.  You have puffiness  (swelling) in your ankles.  You have trouble with your vision. GET HELP RIGHT AWAY IF:   You get a very bad headache and are confused.  You feel weak, numb, or faint.  You get chest or belly (abdominal) pain.  You throw up (vomit).  You cannot breathe very well. MAKE SURE YOU:   Understand these instructions.  Will watch your condition.  Will get help right away if you are not doing well or get worse. Document Released: 04/09/2008 Document Revised: 10/27/2013 Document Reviewed: 08/14/2013 [facility] Patient Information 2015 [place], [place]. This information is not intended to replace advice given to you by your health care provider. Make sure you discuss any questions you have with your health care provider.  How to Take Your Blood Pressure HOW DO I GET A BLOOD PRESSURE MACHINE?  You can buy an electronic home blood pressure machine at your local pharmacy. Insurance will sometimes cover the cost if you have a prescription.  Ask your doctor what type of machine is best for you. There are different machines for your arm and your wrist.  If you decide to buy a machine to check your blood pressure on your arm, first check the size of your arm so you can buy the right size cuff. To check the size of your arm:   Use a measuring tape that shows both inches and centimeters.   Wrap the measuring tape around  the upper-middle part of your arm. You may need someone to help you measure.   Write down your arm measurement in both inches and centimeters.   To measure your blood pressure correctly, it is important to have the right size cuff.   If your arm is up to 13 inches (up to 34 centimeters), get an adult cuff size.  If your arm is 13 to 17 inches (35 to 44 centimeters), get a large adult cuff size.    If your arm is 17 to 20 inches (45 to 52 centimeters), get an adult thigh cuff.  WHAT DO THE NUMBERS MEAN?   There are two numbers that make up your blood pressure. For  example: 120/80.  The first number (120 in our example) is called the "systolic pressure." It is a measure of the pressure in your blood vessels when your heart is pumping blood.  The second number (80 in our example) is called the "diastolic pressure." It is a measure of the pressure in your blood vessels when your heart is resting between beats.  Your doctor will tell you what your blood pressure should be. WHAT SHOULD I DO BEFORE I CHECK MY BLOOD PRESSURE?   Try to rest or relax for at least 30 minutes before you check your blood pressure.  Do not smoke.  Do not have any drinks with caffeine, such as:  Soda.  Coffee.  Tea.  Check your blood pressure in a quiet room.  Sit down and stretch out your arm on a table. Keep your arm at about the level of your heart. Let your arm relax.  Make sure that your legs are not crossed. HOW DO I CHECK MY BLOOD PRESSURE?  Follow the directions that came with your machine.  Make sure you remove any tight-fighting clothing from your arm or wrist. Wrap the cuff around your upper arm or wrist. You should be able to fit a finger between the cuff and your arm. If you cannot fit a finger between the cuff and your arm, it is too tight and should be removed and rewrapped.  Some units require you to manually pump up the arm cuff.  Automatic units inflate the cuff when you press a button.  Cuff deflation is automatic in both models.  After the cuff is inflated, the unit measures your blood pressure and pulse. The readings are shown on a monitor. Hold still and breathe normally while the cuff is inflated.  Getting a reading takes less than a minute.  Some models store readings in a memory. Some provide a printout of readings. If your machine does not store your readings, keep a written record.  Take readings with you to your next visit with your doctor. Document Released: 10/04/2008 Document Revised: 03/08/2014 Document Reviewed:  12/17/2013 [facility] Patient Information 2015 [place], [place]. This information is not intended to replace advice given to you by your health care provider. Make sure you discuss any questions you have with your health care provider.

## 2014-08-30 NOTE — ED Provider Notes (Signed)
CSN: [id number]     Arrival date & time 08/30/14  1854 History   First MD Initiated Contact with Patient 08/30/14 2114     Chief Complaint  Patient presents with  . Shoulder Pain     (Consider location/radiation/quality/duration/timing/severity/associated sxs/prior Treatment) HPI Comments: Joanne Garrett is a 38 y.o. female with a PMHx of RA on humira and MTX, HTN, and remote hx of breast CA in remission and now on tamoxifen, who presents to the ED with complaints of R shoulder pain x5 days. Pt states her pain is located in the anterior portion of her R shoulder, pointing to the biceps tendon, 8/10 sharp constant pain radiating into her biceps, worse with movement of her R shoulder and palpation of the area, improved with heat, and unrelieved by toradol shot, ibuprofen, and medrol dose pack. She states she's had similar pain in the past with her RA flares, therefore she called her rheumatologist and was given a medrol pack 4 days ago, at which time xrays were obtained and negative per her PCP, who also gave her the toradol shot today. Pt endorses associated loss of ROM in her shoulder due to the pain. Denies any fevers, chills, HA, vision changes, CP, SOB, cough, hemoptysis, back pain, neck pain, elbow/wrist pain, skin changes, abd pain, n/v/d/c, paresthesias, weakness, numbness, or erythema/warmth to the joint. 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 Denies any fall or trauma to this arm. Of note, she reports that she did not take her BP meds this morning because she had not eaten before leaving, and then was busy and forgot to take them. Denies any symptoms associated with her HTN at this time.  Patient is a 38 y.o. female presenting with shoulder pain. The history is provided by the patient. No language interpreter was used.  Shoulder Pain This is a recurrent (similar to prior RA flares) problem. The current episode  started in the past 7 days. The problem occurs constantly. The problem has been unchanged. Associated symptoms include arthralgias (R shoulder) and myalgias (R biceps). Pertinent negatives include no abdominal pain, chest pain, chills, congestion, coughing, diaphoresis, fever, headaches, joint swelling, nausea, neck pain, numbness, vomiting or weakness. Exacerbated by: movement of R shoulder. She has tried NSAIDs and heat (heat, ibuprofen 800mg  TID, medrol dose pack, and toradol shot) for the symptoms. The treatment provided mild (mild improvement with heat, no relief with other treatments) relief.    Past Medical History  Diagnosis Date  . Rheumatoid arthritis   . Hypertension   . Breast cancer     2013; in remission as of 11/13   Past Surgical History  Procedure Laterality Date  . Left knee surgery    . Abdominal hysterectomy    . Breast lumpectomy     History reviewed. No pertinent family history. History  Substance Use Topics  . Smoking status: Never Smoker   . Smokeless tobacco: Not on file  . Alcohol Use: No   OB History   Grav Para Term Preterm Abortions TAB SAB Ect Mult Living                 Review of Systems  Constitutional: Negative for fever, chills and diaphoresis.  HENT: Negative for congestion.   Eyes: Negative for photophobia and visual disturbance.  Respiratory: Negative for cough, chest tightness, shortness of breath and wheezing.   Cardiovascular: Negative for chest pain and leg swelling.  Gastrointestinal:  Negative for nausea, vomiting, abdominal pain, diarrhea and constipation.  Genitourinary: Negative for dysuria, urgency and flank pain.  Musculoskeletal: Positive for arthralgias (R shoulder) and myalgias (R biceps). Negative for back pain, gait problem, joint swelling, neck pain and neck stiffness.  Skin: Negative for color change.  Neurological: Negative for dizziness, syncope, weakness, light-headedness, numbness and headaches.  Psychiatric/Behavioral:  Negative for confusion.   10 Systems reviewed and are negative for acute change except as noted in the HPI.    Allergies  Bactrim  Home Medications   Prior to Admission medications   Medication Sig Start Date End Date Taking? Authorizing Provider  Adalimumab (HUMIRA [place]) Inject 40 mg into the skin every 7 (seven) days. Tuesday   Yes Historical Provider, MD  amLODipine (NORVASC) 10 MG tablet Take 10 mg by mouth daily.   Yes Historical Provider, MD  cholecalciferol (VITAMIN D) 1000 UNITS tablet Take 1,000 Units by mouth 2 (two) times a week.   Yes Historical Provider, MD  folic acid (FOLVITE) 1 MG tablet Take 1 mg by mouth daily.   Yes Historical Provider, MD  ibuprofen (ADVIL,MOTRIN) 800 MG tablet Take 800 mg by mouth 3 (three) times daily.   Yes Historical Provider, MD  methotrexate (50 MG/ML) 1 G injection Inject 20 mg into the vein once a week.   Yes Historical Provider, MD  tamoxifen (NOLVADEX) 20 MG tablet Take 20 mg by mouth at bedtime.   Yes Historical Provider, MD   BP 153/105  Pulse 72  Temp(Src) 98.5 F (36.9 C) (Oral)  Resp 18  Ht 5\' 4"  (1.626 m)  Wt 203 lb (92.08 kg)  BMI 34.83 kg/m2  SpO2 100%  LMP 06/11/2013 Physical Exam  Nursing note and vitals reviewed. Constitutional: She is oriented to person, place, and time. She appears well-developed and well-nourished. No distress.  Afebrile, nontoxic, NAD. HTN noted  HENT:  Head: Normocephalic and atraumatic.  Mouth/Throat: Oropharynx is clear and moist and mucous membranes are normal.  Eyes: Conjunctivae and EOM are normal. Pupils are equal, round, and reactive to light. Right eye exhibits no discharge. Left eye exhibits no discharge.  Neck: Normal range of motion. Neck supple. No spinous process tenderness and no muscular tenderness present. No rigidity. Normal range of motion present.  Cardiovascular: Normal rate, regular rhythm, normal heart sounds and intact distal pulses.  Exam reveals no gallop and no friction rub.    No murmur heard. Distal pulses intact in all extremities  Pulmonary/Chest: Effort normal and breath sounds normal. No respiratory distress. She has no decreased breath sounds. She has no wheezes. She has no rhonchi. She has no rales.  Abdominal: Soft. Normal appearance and bowel sounds are normal. She exhibits no distension. There is no tenderness. There is no rigidity, no rebound and no guarding.  Musculoskeletal:       Right shoulder: She exhibits decreased range of motion (due to pain) and tenderness. She exhibits no swelling, no effusion, no crepitus, no deformity, no spasm, normal pulse and normal strength.       Right elbow: Normal.      Right wrist: Normal.       Cervical back: Normal.       Arms: R shoulder with decreased AROM due to pain, but full passive ROM without pain. Moderate TTP in bicipital tendon groove anteriorly as well as into biceps muscle without any bony TTP or crepitus in remainder of shoulder, elbow, and wrist. No swelling, effusion, crepitus, spasm, or deformity at R shoulder. No  erythema or warmth at joint. FROM intact in elbow and wrist. Grip strength 5/5 bilaterally, and 5/5 strength with elbow flexion vs resistance although this does elicit some pain at R shoulder. Sensation grossly intact in all extremities. Distal pulses intact and equal in all extremities.   Neurological: She is alert and oriented to person, place, and time. She has normal strength. No sensory deficit.  Skin: Skin is warm, dry and intact. No rash noted.  Psychiatric: She has a normal mood and affect.    ED Course  Procedures (including critical care time) Labs Review Labs Reviewed - No data to display  Imaging Review No results found.   EKG Interpretation None      MDM   Final diagnoses:  Bicipital tendinitis of right shoulder  Right shoulder pain  Essential hypertension    38y/o female with RA, here for R shoulder pain x5 days. Xray at PCP was WNL per pt. Chart review reveals  they gave her medrol dose pack for possible RA flare, which has not helped significantly. Also gave IM toradol. Passive ROM without pain, doubt adhesive capsulitis. Biceps tendon exquisitely tender. 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 Likely bicipital tendintis vs RA flare. No concerning s/sx that would warrant cardiac work up, doubt that this is cardiovascular in etiology. Extremity is neurovascularly intact with soft compartments. Discussed treatment with gentle ROM, scheduled ibuprofen and heat therapy, as well as f/up in 3 days with her PCP. Pt declines wanting narcotic meds, or any pain meds here today. Discussed not using sling in order to avoid adhesive capsulitis, which pt agrees with. For her BP, no concerning red flag s/sx, no need for work up today, likely related to pain and not taking amlodipine today. Will have her f/up with PCP. I explained the diagnosis and have given explicit precautions to return to the ER including for any other new or worsening symptoms. The patient understands and accepts the medical plan as it's been dictated and I have answered their questions. Discharge instructions concerning home care and prescriptions have been given. The patient is STABLE and is discharged to home in good condition.  BP 142/100  Pulse 78  Temp(Src) 98.1 F (36.7 C) (Oral)  Resp 16  Ht 5\' 4"  (1.626 m)  Wt 203 lb (92.08 kg)  BMI 34.83 kg/m2  SpO2 96%  LMP 06/11/2013     Veda Arrellano Strupp Camprubi-Soms, [name] 08/31/14 0151

## 2014-08-31 DIAGNOSIS — J301 Allergic rhinitis due to pollen: Secondary | ICD-10-CM | POA: Insufficient documentation

## 2014-08-31 DIAGNOSIS — D219 Benign neoplasm of connective and other soft tissue, unspecified: Secondary | ICD-10-CM | POA: Insufficient documentation

## 2014-08-31 DIAGNOSIS — H903 Sensorineural hearing loss, bilateral: Secondary | ICD-10-CM | POA: Insufficient documentation

## 2014-08-31 DIAGNOSIS — N61 Mastitis without abscess: Secondary | ICD-10-CM | POA: Insufficient documentation

## 2014-08-31 DIAGNOSIS — M2550 Pain in unspecified joint: Secondary | ICD-10-CM | POA: Insufficient documentation

## 2014-08-31 DIAGNOSIS — R928 Other abnormal and inconclusive findings on diagnostic imaging of breast: Secondary | ICD-10-CM | POA: Insufficient documentation

## 2014-08-31 DIAGNOSIS — N920 Excessive and frequent menstruation with regular cycle: Secondary | ICD-10-CM | POA: Insufficient documentation

## 2014-08-31 DIAGNOSIS — G5600 Carpal tunnel syndrome, unspecified upper limb: Secondary | ICD-10-CM | POA: Insufficient documentation

## 2014-08-31 DIAGNOSIS — N926 Irregular menstruation, unspecified: Secondary | ICD-10-CM | POA: Insufficient documentation

## 2014-08-31 DIAGNOSIS — R102 Pelvic and perineal pain: Secondary | ICD-10-CM | POA: Insufficient documentation

## 2014-08-31 DIAGNOSIS — C50919 Malignant neoplasm of unspecified site of unspecified female breast: Secondary | ICD-10-CM | POA: Insufficient documentation

## 2014-08-31 DIAGNOSIS — M25512 Pain in left shoulder: Secondary | ICD-10-CM | POA: Insufficient documentation

## 2014-08-31 DIAGNOSIS — M722 Plantar fascial fibromatosis: Secondary | ICD-10-CM | POA: Insufficient documentation

## 2014-08-31 DIAGNOSIS — R319 Hematuria, unspecified: Secondary | ICD-10-CM | POA: Insufficient documentation

## 2014-08-31 DIAGNOSIS — M25559 Pain in unspecified hip: Secondary | ICD-10-CM | POA: Insufficient documentation

## 2014-08-31 HISTORY — DX: Carpal tunnel syndrome, unspecified upper limb: G56.00

## 2014-08-31 NOTE — ED Provider Notes (Signed)
Medical screening examination/treatment/procedure(s) were performed by non-physician practitioner and as supervising physician I was immediately available for consultation/collaboration.   EKG Interpretation None        Debby Freiberg, MD 08/31/14 [id number]

## 2014-10-29 ENCOUNTER — Emergency Department (HOSPITAL_COMMUNITY)
Admission: EM | Admit: 2014-10-29 | Discharge: 2014-10-29 | Disposition: A | Discharge status: Home / Self Care | Payer: 59 | Attending: Emergency Medicine | Admitting: Emergency Medicine

## 2014-10-29 DIAGNOSIS — Z853 Personal history of malignant neoplasm of breast: Secondary | ICD-10-CM | POA: Diagnosis not present

## 2014-10-29 DIAGNOSIS — M069 Rheumatoid arthritis, unspecified: Secondary | ICD-10-CM | POA: Insufficient documentation

## 2014-10-29 DIAGNOSIS — T814XXA Infection following a procedure, initial encounter: Secondary | ICD-10-CM | POA: Diagnosis not present

## 2014-10-29 DIAGNOSIS — L089 Local infection of the skin and subcutaneous tissue, unspecified: Secondary | ICD-10-CM | POA: Diagnosis present

## 2014-10-29 DIAGNOSIS — Y838 Other surgical procedures as the cause of abnormal reaction of the patient, or of later complication, without mention of misadventure at the time of the procedure: Secondary | ICD-10-CM | POA: Insufficient documentation

## 2014-10-29 DIAGNOSIS — Z79899 Other long term (current) drug therapy: Secondary | ICD-10-CM | POA: Insufficient documentation

## 2014-10-29 DIAGNOSIS — I10 Essential (primary) hypertension: Secondary | ICD-10-CM | POA: Insufficient documentation

## 2014-10-29 DIAGNOSIS — T148XXA Other injury of unspecified body region, initial encounter: Secondary | ICD-10-CM

## 2014-10-29 DIAGNOSIS — L24A9 Irritant contact dermatitis due friction or contact with other specified body fluids: Secondary | ICD-10-CM

## 2014-10-29 NOTE — Discharge Instructions (Signed)
Please contact her surgeon as well as the home health nurse.  She was going to start wound care in the meantime, I would suggest hacking your wound daily as demonstrated in the emergency department, placed the packing and dry and pull it out.  Leave the wound open for approximately one hour before packing again

## 2014-10-29 NOTE — ED Notes (Signed)
Pt had abcess opened  On Dec 11th on left breast.  She has been packing it.  Today she noted that it had red area and green discharge which nis new

## 2014-10-29 NOTE — ED Provider Notes (Addendum)
CSN: [id number]     Arrival date & time 10/29/14  2157 History   First MD Initiated Contact with Patient 10/29/14 2227     Chief Complaint  Patient presents with  . Recurrent Skin Infections     (Consider location/radiation/quality/duration/timing/severity/associated sxs/prior Treatment) HPI Comments: Chronic wound L breast needs packing material   The history is provided by the patient.    Past Medical History  Diagnosis Date  . Rheumatoid arthritis   . Hypertension   . Breast cancer     2013; in remission as of 11/13   Past Surgical History  Procedure Laterality Date  . Left knee surgery    . Abdominal hysterectomy    . Breast lumpectomy     No family history on file. History  Substance Use Topics  . Smoking status: Never Smoker   . Smokeless tobacco: Not on file  . Alcohol Use: No   OB History    No data available     Review of Systems  Skin: Positive for wound.  All other systems reviewed and are negative.     Allergies  Bactrim  Home Medications   Prior to Admission medications   Medication Sig Start Date End Date Taking? Authorizing Provider  Adalimumab (HUMIRA [place]) Inject 40 mg into the skin every 7 (seven) days. Tuesday    Historical Provider, MD  amLODipine (NORVASC) 10 MG tablet Take 10 mg by mouth daily.    Historical Provider, MD  cholecalciferol (VITAMIN D) 1000 UNITS tablet Take 1,000 Units by mouth 2 (two) times a week.    Historical Provider, MD  folic acid (FOLVITE) 1 MG tablet Take 1 mg by mouth daily.    Historical Provider, MD  ibuprofen (ADVIL,MOTRIN) 800 MG tablet Take 800 mg by mouth 3 (three) times daily.    Historical Provider, MD  methotrexate (50 MG/ML) 1 G injection Inject 20 mg into the vein once a week.    Historical Provider, MD  tamoxifen (NOLVADEX) 20 MG tablet Take 20 mg by mouth at bedtime.    Historical Provider, MD   BP 139/95 mmHg  Pulse 81  Temp(Src) 98.7 F (37.1 C) (Oral)  Resp 24  Ht 5\' 4"  (1.626 m)  Wt 199  lb (90.266 kg)  BMI 34.14 kg/m2  SpO2 97%  LMP 06/11/2013 Physical Exam  Constitutional: She appears well-developed and well-nourished.  HENT:  Head: Normocephalic.  Eyes: Pupils are equal, round, and reactive to light.  Neck: Normal range of motion.  Cardiovascular: Normal rate.   Pulmonary/Chest: Effort normal. She exhibits no tenderness.  Musculoskeletal: Normal range of motion.  Neurological: She is alert.  Skin: Skin is warm.  She has an open area on the left breast approximately 2 cm round, this is a surgical incision.  It is quite deep.  This been healing by secondary intention and she was concerned because she had a moderate amount of serosanguineous drainage that turned slightly initially.  In color.  Denies any fever, increased pain  Nursing note and vitals reviewed.   ED Course  Procedures (including critical care time) Labs Review Labs Reviewed - No data to display  Imaging Review No results found.   EKG Interpretation None      MDM  The wound is healing well.  It has nice pink healthy margins, inner side of the wound shows no sign of infection.  It is pink as well with serosanguineous liquidy drainage does not have an odor.  The wound was packed.  Patient  was instructed on daily packing.  She is to follow-up with her surgeon.  She is expecting home health nurse to come and apply a wound VAC in the next several days Final diagnoses:  Wound drainage         Garald Balding, NP 10/29/14 7124  Debby Freiberg, MD 11/01/14 1409  Garald Balding, NP 11/16/14 2004  Debby Freiberg, MD 11/17/14 1906

## 2014-10-29 NOTE — ED Notes (Signed)
NP at the bedside

## 2015-01-06 ENCOUNTER — Other Ambulatory Visit: Payer: Self-pay | Admitting: Internal Medicine

## 2015-01-06 DIAGNOSIS — M4712 Other spondylosis with myelopathy, cervical region: Secondary | ICD-10-CM

## 2015-01-13 ENCOUNTER — Ambulatory Visit
Admission: RE | Admit: 2015-01-13 | Discharge: 2015-01-13 | Disposition: A | Discharge status: Home / Self Care | Payer: 59 | Source: Ambulatory Visit | Attending: Internal Medicine | Admitting: Internal Medicine

## 2015-01-13 DIAGNOSIS — M4712 Other spondylosis with myelopathy, cervical region: Secondary | ICD-10-CM

## 2015-01-14 ENCOUNTER — Emergency Department (HOSPITAL_COMMUNITY)
Admission: EM | Admit: 2015-01-14 | Discharge: 2015-01-14 | Disposition: A | Discharge status: Home / Self Care | Payer: 59 | Attending: Emergency Medicine | Admitting: Emergency Medicine

## 2015-01-14 ENCOUNTER — Encounter (HOSPITAL_COMMUNITY): Payer: Self-pay | Admitting: *Deleted

## 2015-01-14 DIAGNOSIS — I10 Essential (primary) hypertension: Secondary | ICD-10-CM | POA: Diagnosis not present

## 2015-01-14 DIAGNOSIS — M5412 Radiculopathy, cervical region: Secondary | ICD-10-CM | POA: Insufficient documentation

## 2015-01-14 DIAGNOSIS — Z791 Long term (current) use of non-steroidal anti-inflammatories (NSAID): Secondary | ICD-10-CM | POA: Diagnosis not present

## 2015-01-14 DIAGNOSIS — M25511 Pain in right shoulder: Secondary | ICD-10-CM | POA: Insufficient documentation

## 2015-01-14 DIAGNOSIS — Z853 Personal history of malignant neoplasm of breast: Secondary | ICD-10-CM | POA: Insufficient documentation

## 2015-01-14 DIAGNOSIS — M62838 Other muscle spasm: Secondary | ICD-10-CM | POA: Insufficient documentation

## 2015-01-14 DIAGNOSIS — M542 Cervicalgia: Secondary | ICD-10-CM | POA: Diagnosis present

## 2015-01-14 DIAGNOSIS — R51 Headache: Secondary | ICD-10-CM | POA: Diagnosis not present

## 2015-01-14 MED ORDER — KETOROLAC TROMETHAMINE 30 MG/ML IJ SOLN
30.0000 mg | Freq: Once | INTRAMUSCULAR | Status: AC
  Administered 2015-01-14: 30 mg via INTRAVENOUS
  Filled 2015-01-14: qty 1

## 2015-01-14 MED ORDER — DIAZEPAM 5 MG/ML IJ SOLN
5.0000 mg | Freq: Once | INTRAMUSCULAR | Status: AC
  Administered 2015-01-14: 5 mg via INTRAVENOUS
  Filled 2015-01-14: qty 2

## 2015-01-14 MED ORDER — OXYCODONE-ACETAMINOPHEN 5-325 MG PO TABS: 1.0000 | ORAL_TABLET | Freq: Four times a day (QID) | ORAL | Status: DC | PRN

## 2015-01-14 MED ORDER — MORPHINE SULFATE 4 MG/ML IJ SOLN
4.0000 mg | Freq: Once | INTRAMUSCULAR | Status: AC
  Administered 2015-01-14: 4 mg via INTRAVENOUS
  Filled 2015-01-14: qty 1

## 2015-01-14 MED ORDER — ONDANSETRON HCL 4 MG/2ML IJ SOLN
4.0000 mg | Freq: Once | INTRAMUSCULAR | Status: AC
  Administered 2015-01-14: 4 mg via INTRAVENOUS
  Filled 2015-01-14: qty 2

## 2015-01-14 NOTE — ED Notes (Signed)
Discharge instructions and prescription given  Voiced understanding.

## 2015-01-14 NOTE — Discharge Instructions (Signed)
Take motrin for pain.   Take percocet for severe pain. Do NOT drive with it.   Continue zanaflex.   Follow up with your neurologist as scheduled.   Return to ER if you have severe pain, weakness, numbness.

## 2015-01-14 NOTE — ED Notes (Signed)
Pt c/o a headache, right shoulder pain and upper back pain. Pt states she had an MRI done Thursday AM but do not know the results. Pt states she has taken zanaflex and ibuprofen but it did not help. Pt states that she is unable to lay down or sleep due to pain.

## 2015-01-14 NOTE — ED Provider Notes (Signed)
CSN: [id number]     Arrival date & time 01/14/15  0309 History  This chart was scribed for Wandra Arthurs, MD by Eustaquio Maize, ED Scribe. This patient was seen in room D36C/D36C and the patient's care was started at 3:27 AM.    Chief Complaint  Patient presents with  . Headache  . Shoulder Pain  . Back Pain    The history is provided by the patient. No language interpreter was used.     HPI Comments: Joanne Garrett is a 39 y.o. female who presents to the Emergency Department complaining of a headache, right shoulder pain, and neck pain for a week. She mentions that she is also having muscle spasms. Pt had MRI done yesterdayand was found to have a right central disc protrusion at C5 and C6. She states that her pain was exacerbated after laying in the MRI machine. Pt reports that she has been taking Zanaflex without any relief. She also complains of numbness to the 2nd, 3rd, and 4th digits of her right hand. Pt mentions that she has an appointment with a neurologist on Wednesday, 01/19/2015. She denies weakness or urinary incontinence or trouble walking.      Past Medical History  Diagnosis Date  . Rheumatoid arthritis   . Hypertension   . Breast cancer     2013; in remission as of 11/13   Past Surgical History  Procedure Laterality Date  . Left knee surgery    . Abdominal hysterectomy    . Breast lumpectomy     No family history on file. History  Substance Use Topics  . Smoking status: Never Smoker   . Smokeless tobacco: Not on file  . Alcohol Use: No   OB History    No data available     Review of Systems  Musculoskeletal: Positive for arthralgias (Right shoulder pain. ) and neck pain.       Positive for muscle spasms.   Neurological: Positive for numbness (2nd, 3rd, and 4th digits of right hand. ) and headaches.  All other systems reviewed and are negative.     Allergies  Bactrim  Home Medications   Prior to Admission medications   Medication Sig Start  Date End Date Taking? Authorizing Provider  Adalimumab 40 MG/0.8ML PSKT Inject 40 mg into the skin every 14 (fourteen) days. On Tuesday   Yes Historical Provider, MD  amLODipine (NORVASC) 10 MG tablet Take 10 mg by mouth daily.   Yes Historical Provider, MD  cholecalciferol (VITAMIN D) 1000 UNITS tablet Take 1,000 Units by mouth daily.    Yes Historical Provider, MD  folic acid (FOLVITE) 1 MG tablet Take 1 mg by mouth daily.   Yes Historical Provider, MD  letrozole [facility]) 2.5 MG tablet Take 2.5 mg by mouth. 01/03/15 02/02/15 Yes Historical Provider, MD  methotrexate (50 MG/ML) 1 G injection Inject 20 mg into the vein once a week.   Yes Historical Provider, MD  sertraline (ZOLOFT) 100 MG tablet Take 150 mg by mouth daily.  01/06/15  Yes Historical Provider, MD  tiZANidine (ZANAFLEX) 4 MG tablet Take 4 mg by mouth 3 (three) times daily.  01/06/15  Yes Historical Provider, MD  Adalimumab (HUMIRA [place]) Inject 40 mg into the skin every 14 (fourteen) days. Tuesday    Historical Provider, MD  ibuprofen (ADVIL,MOTRIN) 800 MG tablet Take 800 mg by mouth 3 (three) times daily.    Historical Provider, MD  oxyCODONE-acetaminophen (PERCOCET) 5-325 MG per tablet Take 1-2 tablets by mouth  every 6 (six) hours as needed. 01/14/15   Wandra Arthurs, MD  tamoxifen (NOLVADEX) 20 MG tablet Take 20 mg by mouth at bedtime.    Historical Provider, MD   BP 155/99 mmHg  Pulse 71  Temp(Src) 98.5 F (36.9 C) (Oral)  Resp 16  SpO2 98%  LMP 06/11/2013   Physical Exam  Constitutional: She is oriented to person, place, and time. She appears well-developed and well-nourished. No distress.  HENT:  Head: Normocephalic and atraumatic.  Eyes: Conjunctivae and EOM are normal.  Neck: Neck supple. No tracheal deviation present.  Right paracervical muscle spasms along the trapezius.  Cardiovascular: Normal rate.   Pulmonary/Chest: Effort normal. No respiratory distress.  Musculoskeletal: Normal range of motion.  Neurological: She is  alert and oriented to person, place, and time. She has normal strength.  Nl hand grasp bilaterally. Nl biceps flexion. Nl reflexes. Nl sensation bilateral arms. Nl gait   Skin: Skin is warm and dry.  Psychiatric: She has a normal mood and affect. Her behavior is normal.  Nursing note and vitals reviewed.   ED Course  Procedures (including critical care time)  DIAGNOSTIC STUDIES: Oxygen Saturation is 98 % on RA, normal by my interpretation.    COORDINATION OF CARE: 3:35 AM-Discussed treatment plan which includes pain medication with pt at bedside and pt agreed to plan.   Labs Review Labs Reviewed - No data to display  Imaging Review Mr Cervical Spine Wo Contrast  01/13/2015   CLINICAL DATA:  Pain, numbness and tingling in the right arm, right side of the body and right leg. History of breast cancer diagnosed in 2013.  EXAM: MRI CERVICAL SPINE WITHOUT CONTRAST  TECHNIQUE: Multiplanar, multisequence MR imaging of the cervical spine was performed. No intravenous contrast was administered.  COMPARISON:  None.  FINDINGS: There is no evidence of metastatic disease. Vertebral body height is maintained. There is approximately 16 degrees kyphosis about the C5-6 level. The craniocervical junction is normal and cervical cord signal is normal. Imaged paraspinous structures are unremarkable.  C2-3:  Negative.  C3-4:  Mild disc bulge without central canal or foraminal narrowing.  C4-5: Mild disc bulge and uncovertebral disease without central canal or foraminal stenosis.  C5-6: The patient has a disc osteophyte complex with a superimposed right paracentral disc protrusion. The cord is flattened by disc. Moderate bilateral foraminal narrowing is identified.  C6-7: Very small disc osteophyte complex is identified and there is right worse than left uncovertebral disease. The central canal is open. Moderate to moderately severe foraminal narrowing is worse on the right.  C7-T1: Perineural cysts about the exiting  C8 nerve roots are incidentally noted. The central canal and foramina are widely patent.  T1-2:  Imaged in the sagittal plane only and negative.  T2-3: A central disc protrusion contacts and slightly indents the ventral cord. The central canal and foramina appear open.  IMPRESSION: Disc osteophyte complex and superimposed right paracentral protrusion at C5-6 flatten the cord. Moderate bilateral foraminal narrowing is present at this level. There is also approximately 16 degrees kyphosis about the C5-6 level.  Uncovertebral disease at C6-7 causes moderate to moderately severe foraminal narrowing, worse on the right.  Central disc protrusion at T2-3 indents the ventral cord although the central canal is widely patent.  The exam is negative for metastatic disease.   Electronically Signed   By: Inge Rise M.D.   On: 01/13/2015 09:07     EKG Interpretation None      MDM  Final diagnoses:  Cervical radiculopathy   Joanne Garrett is a 39 y.o. female here with cervical radiculopathy. Had MRI yesterday that showed C5-C6 impingement. No weakness or numbness currently. Will give pain meds and reassess.  6:30 AM  Pain improved with meds. Has neurology f/u. Stable for d/c.   I personally performed the services described in this documentation, which was scribed in my presence. The recorded information has been reviewed and is accurate.    Wandra Arthurs, MD 01/14/15 2258

## 2016-01-21 ENCOUNTER — Encounter (HOSPITAL_COMMUNITY): Payer: Self-pay

## 2016-01-21 ENCOUNTER — Emergency Department (HOSPITAL_COMMUNITY)
Admission: EM | Admit: 2016-01-21 | Discharge: 2016-01-21 | Disposition: A | Discharge status: Home / Self Care | Payer: 59 | Attending: Emergency Medicine | Admitting: Emergency Medicine

## 2016-01-21 DIAGNOSIS — N390 Urinary tract infection, site not specified: Secondary | ICD-10-CM

## 2016-01-21 DIAGNOSIS — R197 Diarrhea, unspecified: Secondary | ICD-10-CM | POA: Insufficient documentation

## 2016-01-21 DIAGNOSIS — Z79899 Other long term (current) drug therapy: Secondary | ICD-10-CM | POA: Insufficient documentation

## 2016-01-21 DIAGNOSIS — J029 Acute pharyngitis, unspecified: Secondary | ICD-10-CM

## 2016-01-21 DIAGNOSIS — I10 Essential (primary) hypertension: Secondary | ICD-10-CM | POA: Insufficient documentation

## 2016-01-21 DIAGNOSIS — Z791 Long term (current) use of non-steroidal anti-inflammatories (NSAID): Secondary | ICD-10-CM | POA: Insufficient documentation

## 2016-01-21 DIAGNOSIS — Z853 Personal history of malignant neoplasm of breast: Secondary | ICD-10-CM | POA: Insufficient documentation

## 2016-01-21 DIAGNOSIS — R109 Unspecified abdominal pain: Secondary | ICD-10-CM

## 2016-01-21 DIAGNOSIS — M069 Rheumatoid arthritis, unspecified: Secondary | ICD-10-CM | POA: Insufficient documentation

## 2016-01-21 LAB — COMPREHENSIVE METABOLIC PANEL
ALT: 20 U/L (ref 14–54)
AST: 20 U/L (ref 15–41)
Albumin: 3.8 g/dL (ref 3.5–5.0)
Alkaline Phosphatase: 68 U/L (ref 38–126)
Anion gap: 12 (ref 5–15)
BUN: 10 mg/dL (ref 6–20)
CO2: 27 mmol/L (ref 22–32)
Calcium: 9.5 mg/dL (ref 8.9–10.3)
Chloride: 103 mmol/L (ref 101–111)
Creatinine, Ser: 0.67 mg/dL (ref 0.44–1.00)
GFR calc Af Amer: >60 mL/min (ref >60)
GFR calc non Af Amer: >60 mL/min (ref >60)
Glucose, Bld: 95 mg/dL (ref 65–99)
Potassium: 3 mmol/L — ABNORMAL LOW (ref 3.5–5.1)
Sodium: 142 mmol/L (ref 135–145)
Total Bilirubin: 0.3 mg/dL (ref 0.3–1.2)
Total Protein: 7.2 g/dL (ref 6.5–8.1)

## 2016-01-21 LAB — CBC
HCT: 37.1 % (ref 36.0–46.0)
Hemoglobin: 11.6 g/dL — ABNORMAL LOW (ref 12.0–15.0)
MCH: 26.4 pg (ref 26.0–34.0)
MCHC: 31.3 g/dL (ref 30.0–36.0)
MCV: 84.5 fL (ref 78.0–100.0)
Platelets: 194 10*3/uL (ref 150–400)
RBC: 4.39 MIL/uL (ref 3.87–5.11)
RDW: 14.2 % (ref 11.5–15.5)
WBC: 7.5 10*3/uL (ref 4.0–10.5)

## 2016-01-21 LAB — LIPASE, BLOOD: Lipase: 24 U/L (ref 11–51)

## 2016-01-21 LAB — RAPID STREP SCREEN (MED CTR MEBANE ONLY): Streptococcus, Group A Screen (Direct): NEGATIVE

## 2016-01-21 LAB — URINE MICROSCOPIC-ADD ON

## 2016-01-21 LAB — URINALYSIS, ROUTINE W REFLEX MICROSCOPIC
Bilirubin Urine: NEGATIVE
Glucose, UA: NEGATIVE mg/dL
Ketones, ur: NEGATIVE mg/dL
Nitrite: NEGATIVE
Protein, ur: 30 mg/dL — AB
Specific Gravity, Urine: 1.016 (ref 1.005–1.030)
pH: 8 (ref 5.0–8.0)

## 2016-01-21 MED ORDER — IBUPROFEN 400 MG PO TABS
600.0000 mg | ORAL_TABLET | Freq: Once | ORAL | Status: AC
  Administered 2016-01-21: 600 mg via ORAL
  Filled 2016-01-21: qty 1

## 2016-01-21 MED ORDER — NITROFURANTOIN MONOHYD MACRO 100 MG PO CAPS: 100.0000 mg | ORAL_CAPSULE | Freq: Two times a day (BID) | ORAL | Status: DC

## 2016-01-21 MED ORDER — POTASSIUM CHLORIDE CRYS ER 20 MEQ PO TBCR
40.0000 meq | EXTENDED_RELEASE_TABLET | Freq: Once | ORAL | Status: AC
  Administered 2016-01-21: 40 meq via ORAL
  Filled 2016-01-21: qty 2

## 2016-01-21 MED ORDER — DEXAMETHASONE SODIUM PHOSPHATE 10 MG/ML IJ SOLN
10.0000 mg | Freq: Once | INTRAMUSCULAR | Status: AC
  Administered 2016-01-21: 10 mg via INTRAMUSCULAR
  Filled 2016-01-21: qty 1

## 2016-01-21 NOTE — ED Notes (Signed)
Pt here with generalized abdominal pain onset 4 days ago associated with diarrhea all day Thursday. Pt also reports sore throat with white patches to the back throat.

## 2016-01-21 NOTE — ED Notes (Signed)
Pt states sore throat and white patches since thursday

## 2016-01-21 NOTE — ED Provider Notes (Signed)
CSN: [id number]     Arrival date & time 01/21/16  1148 History   First MD Initiated Contact with Patient 01/21/16 1514     Chief Complaint  Patient presents with  . Abdominal Pain     (Consider location/radiation/quality/duration/timing/severity/associated sxs/prior Treatment) HPI  40 year old female with abdominal pain, diarrhea and sore throat x 3 days. Noticed white spots on tonsils today. No fevers, has had chills. No blood in stool. No nausea/vomiting. Abdominal pain is mostly upper. No dysuria. No vaginal bleeding/discharge. No neck pain/stiffness. No cough. Abdominal pain is crampy, was worst around when having diarrhea. Diarrhea improving.   Past Medical History  Diagnosis Date  . Rheumatoid arthritis ([place])   . Hypertension   . Breast cancer ([place])     2013; in remission as of 11/13   Past Surgical History  Procedure Laterality Date  . Left knee surgery    . Abdominal hysterectomy    . Breast lumpectomy     No family history on file. Social History  Substance Use Topics  . Smoking status: Never Smoker   . Smokeless tobacco: None  . Alcohol Use: No   OB History    No data available     Review of Systems  Constitutional: Positive for chills. Negative for fever.  HENT: Positive for sore throat. Negative for trouble swallowing and voice change.   Respiratory: Negative for cough and shortness of breath.   Gastrointestinal: Positive for abdominal pain and diarrhea. Negative for vomiting.  Genitourinary: Positive for frequency. Negative for dysuria, vaginal bleeding and vaginal discharge.  All other systems reviewed and are negative.     Allergies  Shellfish allergy; Bactrim; and Chocolate flavor  Home Medications   Prior to Admission medications   Medication Sig Start Date End Date Taking? Authorizing Provider  Adalimumab (HUMIRA [place]) Inject 40 mg into the skin every 14 (fourteen) days. Tuesday   Yes Historical Provider, MD  amLODipine (NORVASC) 10 MG tablet  Take 10 mg by mouth daily.   Yes Historical Provider, MD  busPIRone (BUSPAR) 10 MG tablet Take 1 tablet by mouth as needed. anxiety   Yes Historical Provider, MD  cyclobenzaprine (FLEXERIL) 10 MG tablet Take 1 tablet by mouth 3 (three) times daily as needed. For muscle spasm 05/13/15  Yes Historical Provider, MD  folic acid (FOLVITE) 1 MG tablet Take 1 mg by mouth daily.   Yes Historical Provider, MD  gabapentin (NEURONTIN) 300 MG capsule Take 900 mg by mouth at bedtime as needed. For nerve pain 04/22/15  Yes Historical Provider, MD  ibuprofen (ADVIL,MOTRIN) 800 MG tablet Take 800 mg by mouth 3 (three) times daily.   Yes Historical Provider, MD  letrozole (FEMARA) 2.5 MG tablet Take 1 tablet by mouth daily. 04/05/15  Yes Historical Provider, MD  methotrexate (50 MG/ML) 1 G injection Inject 20 mg into the vein once a week.   Yes Historical Provider, MD  sertraline (ZOLOFT) 100 MG tablet Take 200 mg by mouth daily.  01/06/15  Yes Historical Provider, MD  tiZANidine (ZANAFLEX) 4 MG tablet Take 4 mg by mouth 3 (three) times daily.  01/06/15  Yes Historical Provider, MD  valACYclovir (VALTREX) 500 MG tablet Take 500 mg by mouth daily. 03/24/15  Yes Historical Provider, MD  oxyCODONE-acetaminophen (PERCOCET) 5-325 MG per tablet Take 1-2 tablets by mouth every 6 (six) hours as needed. 01/14/15   Wandra Arthurs, MD   BP 151/91 mmHg  Pulse 83  Temp(Src) 98.9 F (37.2 C) (Oral)  Resp 18  SpO2 100%  LMP 06/11/2013 Physical Exam  Constitutional: She is oriented to person, place, and time. She appears well-developed and well-nourished.  HENT:  Head: Normocephalic and atraumatic.  Right Ear: External ear normal.  Left Ear: External ear normal.  Nose: Nose normal.  Mouth/Throat: Uvula is midline. Oropharyngeal exudate and posterior oropharyngeal erythema present. No tonsillar abscesses.  3+ tonsils bilaterally  Eyes: Right eye exhibits no discharge. Left eye exhibits no discharge.  Neck: Normal range of motion.  Neck supple.  No stiffness/meningismus  Cardiovascular: Normal rate, regular rhythm and normal heart sounds.   Pulmonary/Chest: Effort normal and breath sounds normal.  Abdominal: Soft. There is generalized tenderness.  Mild, diffuse nonfocal tenderness  Lymphadenopathy:    She has cervical adenopathy.  Neurological: She is alert and oriented to person, place, and time.  Skin: Skin is warm and dry.  Nursing note and vitals reviewed.   ED Course  Procedures (including critical care time) Labs Review Labs Reviewed  COMPREHENSIVE METABOLIC PANEL - Abnormal; Notable for the following:    Potassium 3.0 (*)    All other components within normal limits  CBC - Abnormal; Notable for the following:    Hemoglobin 11.6 (*)    All other components within normal limits  URINALYSIS, ROUTINE W REFLEX MICROSCOPIC (NOT AT [facility]) - Abnormal; Notable for the following:    APPearance TURBID (*)    Hgb urine dipstick SMALL (*)    Protein, ur 30 (*)    Leukocytes, UA SMALL (*)    All other components within normal limits  URINE MICROSCOPIC-ADD ON - Abnormal; Notable for the following:    Squamous Epithelial / LPF 0-5 (*)    Bacteria, UA MANY (*)    Crystals TRIPLE PHOSPHATE CRYSTALS (*)    All other components within normal limits  RAPID STREP SCREEN (NOT AT [facility])  CULTURE, GROUP A STREP ([place])  URINE CULTURE  LIPASE, BLOOD    Imaging Review No results found. I have personally reviewed and evaluated these images and lab results as part of my medical decision-making.   EKG Interpretation None      MDM   Final diagnoses:  Viral pharyngitis  Abdominal pain, unspecified abdominal location  UTI (lower urinary tract infection)    Patient's tenderness is diffuse, difficult to localize. Likely a viral process associated with diarrhea. With no fever, elevated WBC or focal tenderness, low suspicion for acute surgical process such as appendicitis. Has 3+ tonsils with exudates but no abscess or  signs of RPA such as neck stiffness/tenderness. Strep negative. Given decadron to help with swelling. No airway symptoms. Treat symptomatically with fluids, NSAIDs/tylenol. Urine shows UTI, patient denies dysuria but does have some frequency that is new, thus will treat and culture. No flank pain to suggest peylo. Return if symptoms worsen, would consider CT if worsening or not improving.     Sherwood Gambler, MD 01/21/16 [phone]

## 2016-01-23 LAB — URINE CULTURE: Culture: >=100000

## 2016-01-24 ENCOUNTER — Telehealth: Payer: Self-pay | Admitting: *Deleted

## 2016-01-24 LAB — CULTURE, GROUP A STREP (THRC)

## 2016-01-24 NOTE — ED Notes (Signed)
(+)  urine culture, reviewed by Margarita Mail, PA.  Contacted pt, advised to stop Macrobid and begin Keflex 500mg  PO BID x 5 days, called to [facility] [place] [phone].

## 2016-01-24 NOTE — Progress Notes (Signed)
ED Antimicrobial Stewardship Positive Culture Follow Up   Joanne Garrett is an 40 y.o. female who presented to [facility] on 01/21/2016 with a chief complaint of  Chief Complaint  Patient presents with  . Abdominal Pain    Recent Results (from the past 720 hour(s))  Rapid strep screen     Status: None   Collection Time: 01/21/16  2:59 PM  Result Value Ref Range Status   Streptococcus, Group A Screen (Direct) NEGATIVE NEGATIVE Final    Comment: (NOTE) A Rapid Antigen test may result negative if the antigen level in the sample is below the detection level of this test. The FDA has not cleared this test as a stand-alone test therefore the rapid antigen negative result has reflexed to a Group A Strep culture.   Culture, group A strep     Status: None (Preliminary result)   Collection Time: 01/21/16  2:59 PM  Result Value Ref Range Status   Specimen Description THROAT  Final   Special Requests NONE Reflexed from [id number]  Final   Culture CULTURE REINCUBATED FOR BETTER GROWTH  Final   Report Status PENDING  Incomplete  Urine culture     Status: None   Collection Time: 01/21/16  4:01 PM  Result Value Ref Range Status   Specimen Description URINE, RANDOM  Final   Special Requests NONE  Final   Culture >=100,000 COLONIES/mL PROTEUS MIRABILIS  Final   Report Status 01/23/2016 FINAL  Final   Organism ID, Bacteria PROTEUS MIRABILIS  Final      Susceptibility   Proteus mirabilis - MIC*    AMPICILLIN <=2 SENSITIVE Sensitive     CEFAZOLIN <=4 SENSITIVE Sensitive     CEFTRIAXONE <=1 SENSITIVE Sensitive     CIPROFLOXACIN <=0.25 SENSITIVE Sensitive     GENTAMICIN <=1 SENSITIVE Sensitive     IMIPENEM 4 SENSITIVE Sensitive     NITROFURANTOIN 128 RESISTANT Resistant     TRIMETH/SULFA <=20 SENSITIVE Sensitive     AMPICILLIN/SULBACTAM <=2 SENSITIVE Sensitive     PIP/TAZO <=4 SENSITIVE Sensitive     * >=100,000 COLONIES/mL PROTEUS MIRABILIS    Presented with abdominal pain, diarrhea and sore  throat x 3 days. Afebrile, no dysuria, but did have some increased frequency.   [x]  Treated with Macrobid, organism resistant to prescribed antimicrobial  New antibiotic prescription: Stop Macrobid. Start Keflex 500 mg BID x 5 days.  ED Provider: Margarita Mail, [name]   [name] 01/24/2016, 9:13 AM Infectious Diseases Pharmacist Phone# [phone]

## 2016-04-09 ENCOUNTER — Emergency Department (HOSPITAL_COMMUNITY): Payer: Self-pay

## 2016-04-09 ENCOUNTER — Emergency Department (HOSPITAL_COMMUNITY)
Admission: EM | Admit: 2016-04-09 | Discharge: 2016-04-09 | Disposition: A | Discharge status: Home / Self Care | Payer: Self-pay | Attending: Emergency Medicine | Admitting: Emergency Medicine

## 2016-04-09 ENCOUNTER — Encounter (HOSPITAL_COMMUNITY): Payer: Self-pay | Admitting: Emergency Medicine

## 2016-04-09 DIAGNOSIS — I10 Essential (primary) hypertension: Secondary | ICD-10-CM | POA: Insufficient documentation

## 2016-04-09 DIAGNOSIS — R197 Diarrhea, unspecified: Secondary | ICD-10-CM | POA: Insufficient documentation

## 2016-04-09 DIAGNOSIS — R110 Nausea: Secondary | ICD-10-CM | POA: Insufficient documentation

## 2016-04-09 DIAGNOSIS — R1010 Upper abdominal pain, unspecified: Secondary | ICD-10-CM | POA: Insufficient documentation

## 2016-04-09 DIAGNOSIS — Z79899 Other long term (current) drug therapy: Secondary | ICD-10-CM | POA: Insufficient documentation

## 2016-04-09 DIAGNOSIS — Z853 Personal history of malignant neoplasm of breast: Secondary | ICD-10-CM | POA: Insufficient documentation

## 2016-04-09 DIAGNOSIS — R1013 Epigastric pain: Secondary | ICD-10-CM | POA: Insufficient documentation

## 2016-04-09 DIAGNOSIS — M069 Rheumatoid arthritis, unspecified: Secondary | ICD-10-CM | POA: Insufficient documentation

## 2016-04-09 DIAGNOSIS — Z792 Long term (current) use of antibiotics: Secondary | ICD-10-CM | POA: Insufficient documentation

## 2016-04-09 LAB — CBC
HCT: 38.4 % (ref 36.0–46.0)
Hemoglobin: 12 g/dL (ref 12.0–15.0)
MCH: 26.4 pg (ref 26.0–34.0)
MCHC: 31.3 g/dL (ref 30.0–36.0)
MCV: 84.4 fL (ref 78.0–100.0)
Platelets: 229 10*3/uL (ref 150–400)
RBC: 4.55 MIL/uL (ref 3.87–5.11)
RDW: 15.1 % (ref 11.5–15.5)
WBC: 6.3 10*3/uL (ref 4.0–10.5)

## 2016-04-09 LAB — COMPREHENSIVE METABOLIC PANEL
ALT: 25 U/L (ref 14–54)
AST: 33 U/L (ref 15–41)
Albumin: 4 g/dL (ref 3.5–5.0)
Alkaline Phosphatase: 75 U/L (ref 38–126)
Anion gap: 6 (ref 5–15)
BUN: 11 mg/dL (ref 6–20)
CO2: 28 mmol/L (ref 22–32)
Calcium: 9.5 mg/dL (ref 8.9–10.3)
Chloride: 107 mmol/L (ref 101–111)
Creatinine, Ser: 0.93 mg/dL (ref 0.44–1.00)
GFR calc Af Amer: >60 mL/min (ref >60)
GFR calc non Af Amer: >60 mL/min (ref >60)
Glucose, Bld: 99 mg/dL (ref 65–99)
Potassium: 3.1 mmol/L — ABNORMAL LOW (ref 3.5–5.1)
Sodium: 141 mmol/L (ref 135–145)
Total Bilirubin: 0.5 mg/dL (ref 0.3–1.2)
Total Protein: 6.9 g/dL (ref 6.5–8.1)

## 2016-04-09 LAB — URINALYSIS, ROUTINE W REFLEX MICROSCOPIC
Bilirubin Urine: NEGATIVE
Glucose, UA: NEGATIVE mg/dL
Ketones, ur: 15 mg/dL — AB
Nitrite: NEGATIVE
Protein, ur: 100 mg/dL — AB
Specific Gravity, Urine: 1.018 (ref 1.005–1.030)
pH: 6 (ref 5.0–8.0)

## 2016-04-09 LAB — URINE MICROSCOPIC-ADD ON: Bacteria, UA: NONE SEEN

## 2016-04-09 LAB — LIPASE, BLOOD: Lipase: 21 U/L (ref 11–51)

## 2016-04-09 MED ORDER — SODIUM CHLORIDE 0.9 % IV SOLN
1000.0000 mL | Freq: Once | INTRAVENOUS | Status: AC
  Administered 2016-04-09: 1000 mL via INTRAVENOUS

## 2016-04-09 MED ORDER — SODIUM CHLORIDE 0.9 % IV SOLN
1000.0000 mL | INTRAVENOUS | Status: DC
  Administered 2016-04-09: 1000 mL via INTRAVENOUS

## 2016-04-09 MED ORDER — KETOROLAC TROMETHAMINE 30 MG/ML IJ SOLN
30.0000 mg | Freq: Once | INTRAMUSCULAR | Status: AC
  Administered 2016-04-09: 30 mg via INTRAVENOUS
  Filled 2016-04-09: qty 1

## 2016-04-09 MED ORDER — ONDANSETRON HCL 4 MG/2ML IJ SOLN
4.0000 mg | Freq: Four times a day (QID) | INTRAMUSCULAR | Status: DC | PRN
  Administered 2016-04-09: 4 mg via INTRAVENOUS
  Filled 2016-04-09: qty 2

## 2016-04-09 NOTE — ED Notes (Signed)
Gave pt Ginger Ale, per Dr. Venora Maples. Informed Ben - RN.

## 2016-04-09 NOTE — ED Provider Notes (Signed)
CSN: [id number]     Arrival date & time 04/09/16  1016 History   First MD Initiated Contact with Patient 04/09/16 1143     Chief Complaint  Patient presents with  . Abdominal Pain  . Nausea  . Diarrhea      HPI Patient ports intermittent upper abdominal discomfort and pain with associated nausea and some loose stools over the past several months.  She is scheduled for endoscopy tomorrow however today she reports worsening upper abdominal discomfort.  Denies fevers and chills.  No melena or hematochezia described.  No hematemesis described.  Waxing and waning of symptoms.   Past Medical History  Diagnosis Date  . Rheumatoid arthritis ([place])   . Hypertension   . Breast cancer ([place])     2013; in remission as of 11/13   Past Surgical History  Procedure Laterality Date  . Left knee surgery    . Abdominal hysterectomy    . Breast lumpectomy     No family history on file. Social History  Substance Use Topics  . Smoking status: Never Smoker   . Smokeless tobacco: None  . Alcohol Use: No   OB History    No data available     Review of Systems  All other systems reviewed and are negative.     Allergies  Shellfish allergy; Bactrim; and Chocolate flavor  Home Medications   Prior to Admission medications   Medication Sig Start Date End Date Taking? Authorizing Provider  Adalimumab (HUMIRA [place]) Inject 40 mg into the skin every 14 (fourteen) days. Tuesday    Historical Provider, MD  amLODipine (NORVASC) 10 MG tablet Take 10 mg by mouth daily.    Historical Provider, MD  busPIRone (BUSPAR) 10 MG tablet Take 1 tablet by mouth as needed. anxiety    Historical Provider, MD  cyclobenzaprine (FLEXERIL) 10 MG tablet Take 1 tablet by mouth 3 (three) times daily as needed. For muscle spasm 05/13/15   Historical Provider, MD  folic acid (FOLVITE) 1 MG tablet Take 1 mg by mouth daily.    Historical Provider, MD  gabapentin (NEURONTIN) 300 MG capsule Take 900 mg by mouth at bedtime as  needed. For nerve pain 04/22/15   Historical Provider, MD  ibuprofen (ADVIL,MOTRIN) 800 MG tablet Take 800 mg by mouth 3 (three) times daily.    Historical Provider, MD  letrozole (FEMARA) 2.5 MG tablet Take 1 tablet by mouth daily. 04/05/15   Historical Provider, MD  methotrexate (50 MG/ML) 1 G injection Inject 20 mg into the vein once a week.    Historical Provider, MD  nitrofurantoin, macrocrystal-monohydrate, (MACROBID) 100 MG capsule Take 1 capsule (100 mg total) by mouth 2 (two) times daily. 01/21/16   Sherwood Gambler, MD  oxyCODONE-acetaminophen (PERCOCET) 5-325 MG per tablet Take 1-2 tablets by mouth every 6 (six) hours as needed. 01/14/15   Wandra Arthurs, MD  sertraline (ZOLOFT) 100 MG tablet Take 200 mg by mouth daily.  01/06/15   Historical Provider, MD  tiZANidine (ZANAFLEX) 4 MG tablet Take 4 mg by mouth 3 (three) times daily.  01/06/15   Historical Provider, MD  valACYclovir (VALTREX) 500 MG tablet Take 500 mg by mouth daily. 03/24/15   Historical Provider, MD   BP 149/97 mmHg  Pulse 93  Temp(Src) 98.4 F (36.9 C) (Oral)  Resp 18  Ht 5\' 3"  (1.6 m)  Wt 196 lb (88.905 kg)  BMI 34.73 kg/m2  SpO2 97%  LMP 06/11/2013 Physical Exam  Constitutional: She is oriented  to person, place, and time. She appears well-developed and well-nourished. No distress.  HENT:  Head: Normocephalic and atraumatic.  Eyes: EOM are normal.  Neck: Normal range of motion.  Cardiovascular: Normal rate, regular rhythm and normal heart sounds.   Pulmonary/Chest: Effort normal and breath sounds normal.  Abdominal: Soft. She exhibits no distension.  Mild epigastric and right upper quadrant tenderness.  No guarding or rebound.  Musculoskeletal: Normal range of motion.  Neurological: She is alert and oriented to person, place, and time.  Skin: Skin is warm and dry.  Psychiatric: She has a normal mood and affect. Judgment normal.  Nursing note and vitals reviewed.   ED Course  Procedures (including critical care  time) Labs Review Labs Reviewed  COMPREHENSIVE METABOLIC PANEL - Abnormal; Notable for the following:    Potassium 3.1 (*)    All other components within normal limits  URINALYSIS, ROUTINE W REFLEX MICROSCOPIC (NOT AT [facility]) - Abnormal; Notable for the following:    APPearance CLOUDY (*)    Hgb urine dipstick LARGE (*)    Ketones, ur 15 (*)    Protein, ur 100 (*)    Leukocytes, UA SMALL (*)    All other components within normal limits  URINE MICROSCOPIC-ADD ON - Abnormal; Notable for the following:    Squamous Epithelial / LPF 0-5 (*)    Casts HYALINE CASTS (*)    All other components within normal limits  URINE CULTURE  LIPASE, BLOOD  CBC    Imaging Review US Abdomen Limited Ruq  04/09/2016  CLINICAL DATA:  Right upper quadrant pain for 6 months. EXAM: US ABDOMEN LIMITED - RIGHT UPPER QUADRANT COMPARISON:  CT abdomen and pelvis without contrast 09/24/2008. FINDINGS: Gallbladder: No gallstones or wall thickening visualized. No sonographic Murphy sign noted by sonographer. Common bile duct: Diameter: 0.5 cm Liver: Hypoechoic lesion in the left hepatic lobe measures 3.6 x 4.0 x 3.2 cm. The liver is otherwise unremarkable. IMPRESSION: 4.0 cm in diameter hypoechoic, solid lesion left hepatic lobe. Nonemergent MRI of the abdomen with and without contrast is recommended for further evaluation. Normal-appearing gallbladder. Electronically Signed   By: Inge Rise M.D.   On: 04/09/2016 13:32   I have personally reviewed and evaluated these images and lab results as part of my medical decision-making.   EKG Interpretation None      MDM   Final diagnoses:  Upper abdominal pain    CT pending. If normal pt will be stable for discharge with follow up endoscopy in AM  Care to Dr Regenia Skeeter to follow up on [place], MD 04/09/16 1739

## 2016-04-09 NOTE — ED Provider Notes (Signed)
Patient CT scan shows no significant apparatus. Has renal stones but no ureteral stone. Patient has follow-up with GI tomorrow for an EGD. Keep this appointment, return if symptoms worsen.   Results for orders placed or performed during the hospital encounter of 04/09/16  Lipase, blood  Result Value Ref Range   Lipase 21 11 - 51 U/L  Comprehensive metabolic panel  Result Value Ref Range   Sodium 141 135 - 145 mmol/L   Potassium 3.1 (L) 3.5 - 5.1 mmol/L   Chloride 107 101 - 111 mmol/L   CO2 28 22 - 32 mmol/L   Glucose, Bld 99 65 - 99 mg/dL   BUN 11 6 - 20 mg/dL   Creatinine, Ser 0.93 0.44 - 1.00 mg/dL   Calcium 9.5 8.9 - 10.3 mg/dL   Total Protein 6.9 6.5 - 8.1 g/dL   Albumin 4.0 3.5 - 5.0 g/dL   AST 33 15 - 41 U/L   ALT 25 14 - 54 U/L   Alkaline Phosphatase 75 38 - 126 U/L   Total Bilirubin 0.5 0.3 - 1.2 mg/dL   GFR calc non Af Amer >60 >60 mL/min   GFR calc Af Amer >60 >60 mL/min   Anion gap 6 5 - 15  CBC  Result Value Ref Range   WBC 6.3 4.0 - 10.5 K/uL   RBC 4.55 3.87 - 5.11 MIL/uL   Hemoglobin 12.0 12.0 - 15.0 g/dL   HCT 38.4 36.0 - 46.0 %   MCV 84.4 78.0 - 100.0 fL   MCH 26.4 26.0 - 34.0 pg   MCHC 31.3 30.0 - 36.0 g/dL   RDW 15.1 11.5 - 15.5 %   Platelets 229 150 - 400 K/uL  Urinalysis, Routine w reflex microscopic  Result Value Ref Range   Color, Urine YELLOW YELLOW   APPearance CLOUDY (A) CLEAR   Specific Gravity, Urine 1.018 1.005 - 1.030   pH 6.0 5.0 - 8.0   Glucose, UA NEGATIVE NEGATIVE mg/dL   Hgb urine dipstick LARGE (A) NEGATIVE   Bilirubin Urine NEGATIVE NEGATIVE   Ketones, ur 15 (A) NEGATIVE mg/dL   Protein, ur 100 (A) NEGATIVE mg/dL   Nitrite NEGATIVE NEGATIVE   Leukocytes, UA SMALL (A) NEGATIVE  Urine microscopic-add on  Result Value Ref Range   Squamous Epithelial / LPF 0-5 (A) NONE SEEN   WBC, UA 6-30 0 - 5 WBC/hpf   RBC / HPF TOO NUMEROUS TO COUNT 0 - 5 RBC/hpf   Bacteria, UA NONE SEEN NONE SEEN   Casts HYALINE CASTS (A) NEGATIVE   Urine-Other MUCOUS PRESENT    Ct Renal Stone Study  04/09/2016  CLINICAL DATA:  Pain for 6 months with flare this morning. EXAM: CT ABDOMEN AND PELVIS WITHOUT CONTRAST TECHNIQUE: Multidetector CT imaging of the abdomen and pelvis was performed following the standard protocol without IV contrast. COMPARISON:  None. FINDINGS: No free air or free fluid. Numerous tiny stones are seen in the kidneys bilaterally with no hydronephrosis, perinephric stranding or suspicious masses. No ureterectasis or ureteral stones. There are numerous calcifications in the pelvis but none definitely within the ureter. These are thought to represent phleboliths. Liver, gallbladder, pancreas, and adrenal glands are normal. No aneurysm or adenopathy. The stomach and small bowel are normal. The colon and appendix are normal. There is a fat containing umbilical hernia. No adenopathy or mass the pelvis. The patient is status post hysterectomy. Multiple calcifications in the pelvis are thought to be phleboliths. The visualized bones are unremarkable. IMPRESSION: Multiple  small renal stones with no evidence of obstruction. No ureteral stones identified. Electronically Signed   By: Dorise Bullion III M.D   On: 04/09/2016 18:28   US Abdomen Limited Ruq  04/09/2016  CLINICAL DATA:  Right upper quadrant pain for 6 months. EXAM: US ABDOMEN LIMITED - RIGHT UPPER QUADRANT COMPARISON:  CT abdomen and pelvis without contrast 09/24/2008. FINDINGS: Gallbladder: No gallstones or wall thickening visualized. No sonographic Murphy sign noted by sonographer. Common bile duct: Diameter: 0.5 cm Liver: Hypoechoic lesion in the left hepatic lobe measures 3.6 x 4.0 x 3.2 cm. The liver is otherwise unremarkable. IMPRESSION: 4.0 cm in diameter hypoechoic, solid lesion left hepatic lobe. Nonemergent MRI of the abdomen with and without contrast is recommended for further evaluation. Normal-appearing gallbladder. Electronically Signed   By: Inge Rise M.D.   On:  04/09/2016 13:32      Sherwood Gambler, MD 04/09/16 [phone]

## 2016-04-09 NOTE — ED Notes (Signed)
Patient transported to CT 

## 2016-04-09 NOTE — ED Notes (Signed)
Patient states abdominal pain with loose stools x 6 months.  Patient has been seeing [facility] physicians and is supposed to go for endoscopy tomorrow.  Patient states she didn't want to wait, because symptoms worsened today.  Patient states she didn't call [facility], just came straight here.   Patient states she doesn't take anything for the abdominal pain or loose stools.   Patient states she was on macrobid last month for same for gastritis.

## 2016-04-11 DIAGNOSIS — E538 Deficiency of other specified B group vitamins: Secondary | ICD-10-CM | POA: Insufficient documentation

## 2016-04-11 LAB — URINE CULTURE

## 2016-04-13 ENCOUNTER — Other Ambulatory Visit: Payer: Self-pay | Admitting: Physician Assistant

## 2016-04-13 DIAGNOSIS — K769 Liver disease, unspecified: Secondary | ICD-10-CM

## 2016-04-18 ENCOUNTER — Ambulatory Visit
Admission: RE | Admit: 2016-04-18 | Discharge: 2016-04-18 | Disposition: A | Discharge status: Home / Self Care | Payer: 59 | Source: Ambulatory Visit | Attending: Physician Assistant | Admitting: Physician Assistant

## 2016-04-18 ENCOUNTER — Ambulatory Visit (HOSPITAL_COMMUNITY)
Admission: EM | Admit: 2016-04-18 | Discharge: 2016-04-18 | Disposition: A | Discharge status: Home / Self Care | Payer: PRIVATE HEALTH INSURANCE | Attending: Emergency Medicine | Admitting: Emergency Medicine

## 2016-04-18 ENCOUNTER — Encounter (HOSPITAL_COMMUNITY): Payer: Self-pay | Admitting: Emergency Medicine

## 2016-04-18 DIAGNOSIS — L237 Allergic contact dermatitis due to plants, except food: Secondary | ICD-10-CM

## 2016-04-18 DIAGNOSIS — K769 Liver disease, unspecified: Secondary | ICD-10-CM

## 2016-04-18 MED ORDER — GADOXETATE DISODIUM 0.25 MMOL/ML IV SOLN
9.0000 mL | Freq: Once | INTRAVENOUS | Status: AC | PRN
  Administered 2016-04-18: 9 mL via INTRAVENOUS

## 2016-04-18 MED ORDER — PREDNISONE 20 MG PO TABS
ORAL_TABLET | ORAL | Status: DC
Start: 2016-04-18 — End: 2016-04-24

## 2016-04-18 MED ORDER — METHYLPREDNISOLONE ACETATE 80 MG/ML IJ SUSP
80.0000 mg | Freq: Once | INTRAMUSCULAR | Status: AC
  Administered 2016-04-18: 80 mg via INTRAMUSCULAR

## 2016-04-18 MED ORDER — METHYLPREDNISOLONE ACETATE 80 MG/ML IJ SUSP
INTRAMUSCULAR | Status: AC
  Filled 2016-04-18: qty 1

## 2016-04-18 NOTE — Discharge Instructions (Signed)
This is a reaction to the oils of the poison ivy or oak plant. You are not contagious. We did review a steroid shot today to get things started. Take the prednisone taper as prescribed, starting tomorrow. Make sure you take the entire course or the rash may come back. You can continue calamine lotion and bendaryl to help with the itching. Oatmeal baths can also be beneficial. You should see improvement within the next day or so. Follow-up as needed.

## 2016-04-18 NOTE — ED Provider Notes (Signed)
CSN: [id number]     Arrival date & time 04/18/16  1753 History   First MD Initiated Contact with Patient 04/18/16 1913     Chief Complaint  Patient presents with  . Rash   (Consider location/radiation/quality/duration/timing/severity/associated sxs/prior Treatment) HPI She is a 40 year old woman here for evaluation of rash. The rash has been present for about 4 days. Initially it was just on her arms, but quickly spread to her chest, back, legs, and face. It is very itchy. She has tried over-the-counter hydrocortisone cream and calamine lotion without improvement. Benadryl does temporarily relieve the itching. The rash started after working in her yard, trimming a shrub. No previous history of poison ivy dermatitis.  Past Medical History  Diagnosis Date  . Rheumatoid arthritis ([place])   . Hypertension   . Breast cancer ([place])     2013; in remission as of 11/13   Past Surgical History  Procedure Laterality Date  . Left knee surgery    . Abdominal hysterectomy    . Breast lumpectomy     History reviewed. No pertinent family history. Social History  Substance Use Topics  . Smoking status: Never Smoker   . Smokeless tobacco: None  . Alcohol Use: No   OB History    No data available     Review of Systems As in history of present illness Allergies  Shellfish allergy; Bactrim; and Chocolate flavor  Home Medications   Prior to Admission medications   Medication Sig Start Date End Date Taking? Authorizing Provider  Adalimumab (HUMIRA [place]) Inject 40 mg into the skin every 14 (fourteen) days. Tuesday   Yes Historical Provider, MD  amLODipine (NORVASC) 10 MG tablet Take 10 mg by mouth daily.   Yes Historical Provider, MD  busPIRone (BUSPAR) 10 MG tablet Take 1 tablet by mouth as needed. anxiety   Yes Historical Provider, MD  methotrexate (50 MG/ML) 1 G injection Inject 20 mg into the vein once a week.   Yes Historical Provider, MD  sertraline (ZOLOFT) 100 MG tablet Take 200 mg by  mouth daily.  01/06/15  Yes Historical Provider, MD  cyclobenzaprine (FLEXERIL) 10 MG tablet Take 1 tablet by mouth 3 (three) times daily as needed. For muscle spasm 05/13/15   Historical Provider, MD  folic acid (FOLVITE) 1 MG tablet Take 1 mg by mouth daily.    Historical Provider, MD  gabapentin (NEURONTIN) 300 MG capsule Take 900 mg by mouth at bedtime as needed. For nerve pain 04/22/15   Historical Provider, MD  ibuprofen (ADVIL,MOTRIN) 800 MG tablet Take 800 mg by mouth 3 (three) times daily.    Historical Provider, MD  letrozole (FEMARA) 2.5 MG tablet Take 1 tablet by mouth daily. 04/05/15   Historical Provider, MD  oxyCODONE-acetaminophen (PERCOCET) 5-325 MG per tablet Take 1-2 tablets by mouth every 6 (six) hours as needed. 01/14/15   Wandra Arthurs, MD  predniSONE (DELTASONE) 20 MG tablet Take 3 pills daily for 4 days, then 2 pills for 3 days, then 1 pill for 3 days.  Start on 04/19/16. 04/18/16   Melony Overly, MD  tiZANidine (ZANAFLEX) 4 MG tablet Take 4 mg by mouth 3 (three) times daily.  01/06/15   Historical Provider, MD  valACYclovir (VALTREX) 500 MG tablet Take 500 mg by mouth daily. 03/24/15   Historical Provider, MD   Meds Ordered and Administered this Visit   Medications  methylPREDNISolone acetate (DEPO-MEDROL) injection 80 mg (not administered)    BP 168/102 mmHg  Pulse 94  Temp(Src) 98.9 F (37.2 C) (Oral)  SpO2 99%  LMP 06/11/2013 No data found.   Physical Exam  Constitutional: She is oriented to person, place, and time. She appears well-developed and well-nourished. No distress.  Cardiovascular: Normal rate.   Pulmonary/Chest: Effort normal.  Neurological: She is alert and oriented to person, place, and time.  Skin:  Diffuse papulovesicular rash on erythematous base. This involves bilateral upper extremities, chest, face, and abdomen.    ED Course  Procedures (including critical care time)  Labs Review Labs Reviewed - No data to display  Imaging Review Mr Abdomen W  Wo Contrast  04/18/2016  CLINICAL DATA:  Breast cancer.  Liver lesion on prior ultrasound. EXAM: MRI ABDOMEN WITHOUT AND WITH CONTRAST TECHNIQUE: Multiplanar multisequence MR imaging of the abdomen was performed both before and after the administration of intravenous contrast. CONTRAST:  9.0 ml Eovist, a mixed extracellular and hepatocyte specific contrast agent. COMPARISON:  Ultrasound of 04/09/2016. CT stone study of 04/09/2016. FINDINGS: Lower chest: Normal heart size without pericardial or pleural effusion. Hepatobiliary: Hepatomegaly, 19.8 cm craniocaudal.  No cirrhosis. At tiny pericholecystic hepatic cyst. Focus of arterial hyper enhancement measures 7 mm within the anterior segment right liver lobe on image 42/ series 5. Likely a tiny perfusion anomaly. Corresponding to the ultrasound abnormality, within the lateral segment left liver lobe ( segment 2) is a 3.2 x 3.7 cm focus of arterial phase hyper enhancement on image 40/ series 5. Relatively isointense to the remainder of the liver on other pulse sequences. Demonstrates hepatobiliary phase enhancement, including on image 71/series 13. Normal gallbladder, without biliary ductal dilatation. Pancreas: Normal, without mass or ductal dilatation. Spleen: Normal in size, without focal abnormality. Adrenals/Urinary Tract: Normal adrenal glands. renal normal right kidney. Upper pole left renal cyst of approximately 7 mm. No hydronephrosis. Stomach/Bowel: Normal stomach, without wall thickening. Normal abdominal small bowel. Colonic stool burden suggests constipation. Vascular/Lymphatic: Normal caliber of the aorta and branch vessels. No retroperitoneal or retrocrural adenopathy. Other: No ascites. Musculoskeletal: No acute osseous abnormality. IMPRESSION: 1. 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 No evidence of metastatic disease. 2. Upper pole left renal cyst. 3. Hepatomegaly. Electronically Signed    By: Abigail Miyamoto M.D.   On: 04/18/2016 09:14     MDM   1. Poison ivy dermatitis    Rash is consistent with poison ivy dermatitis. Depo-Medrol given here to help with itching. Prednisone taper to go home with. Discussed symptomatic control of itching. Follow-up as needed.    Melony Overly, MD 04/18/16 [phone]

## 2016-04-18 NOTE — ED Notes (Signed)
The patient presented to the [facility] with a complaint of a rash on her arms that has been there for 4 days and she believes it to be poison oak.

## 2016-04-23 ENCOUNTER — Other Ambulatory Visit: Payer: 59

## 2016-04-24 ENCOUNTER — Emergency Department (HOSPITAL_COMMUNITY)
Admission: EM | Admit: 2016-04-24 | Discharge: 2016-04-24 | Disposition: A | Discharge status: Home / Self Care | Payer: PRIVATE HEALTH INSURANCE | Attending: Emergency Medicine | Admitting: Emergency Medicine

## 2016-04-24 ENCOUNTER — Encounter (HOSPITAL_COMMUNITY): Payer: Self-pay

## 2016-04-24 DIAGNOSIS — R220 Localized swelling, mass and lump, head: Secondary | ICD-10-CM | POA: Diagnosis not present

## 2016-04-24 DIAGNOSIS — Z79899 Other long term (current) drug therapy: Secondary | ICD-10-CM | POA: Diagnosis not present

## 2016-04-24 DIAGNOSIS — L239 Allergic contact dermatitis, unspecified cause: Secondary | ICD-10-CM | POA: Insufficient documentation

## 2016-04-24 DIAGNOSIS — Z853 Personal history of malignant neoplasm of breast: Secondary | ICD-10-CM | POA: Diagnosis not present

## 2016-04-24 DIAGNOSIS — R21 Rash and other nonspecific skin eruption: Secondary | ICD-10-CM | POA: Diagnosis present

## 2016-04-24 DIAGNOSIS — I10 Essential (primary) hypertension: Secondary | ICD-10-CM | POA: Insufficient documentation

## 2016-04-24 MED ORDER — HYDROXYZINE HCL 25 MG PO TABS: 25.0000 mg | ORAL_TABLET | Freq: Four times a day (QID) | ORAL | Status: DC | PRN

## 2016-04-24 MED ORDER — DEXAMETHASONE SODIUM PHOSPHATE 10 MG/ML IJ SOLN
10.0000 mg | Freq: Once | INTRAMUSCULAR | Status: AC
  Administered 2016-04-24: 10 mg via INTRAMUSCULAR
  Filled 2016-04-24: qty 1

## 2016-04-24 MED ORDER — HYDROXYZINE HCL 25 MG PO TABS
25.0000 mg | ORAL_TABLET | Freq: Once | ORAL | Status: AC
  Administered 2016-04-24: 25 mg via ORAL
  Filled 2016-04-24: qty 1

## 2016-04-24 MED ORDER — PREDNISONE 20 MG PO TABS
ORAL_TABLET | ORAL | Status: DC
Start: 2016-04-24 — End: 2016-10-28

## 2016-04-24 NOTE — Discharge Instructions (Signed)
Follow-up with your primary care provider or the health department today to get a referral to dermatology. Take the Atarax as prescribed for itching and take the prednisone as prescribed. Be sure to complete the entire course of prednisone.  Return to emergency department if you experience any abnormal symptoms, fever, headache, dizziness, chest pain, shortness of breath.

## 2016-04-24 NOTE — ED Provider Notes (Signed)
CSN: [id number]     Arrival date & time 04/24/16  0750 History   First MD Initiated Contact with Patient 04/24/16 0800     Chief Complaint  Patient presents with  . Facial Swelling  . Rash     (Consider location/radiation/quality/duration/timing/severity/associated sxs/prior Treatment) HPI   Patient is a 40 year old female with a history of RA, HTN, breast cancer presents the ED with a rash. She states the rash began 9 days ago after cutting bushes. Rash began on her hands and spread to her forearms, inner thighs, chest, lowerback and face. She was seen 6 days ago and put on a prednisone taper. She has also tried benadryl and hydrocortisone cream with little relief. She states the rash continues to spread. She woke this morning with redness of the right side of her face and swelling of her eyelids.   Past Medical History  Diagnosis Date  . Rheumatoid arthritis ([place])   . Hypertension   . Breast cancer ([place])     2013; in remission as of 11/13   Past Surgical History  Procedure Laterality Date  . Left knee surgery    . Abdominal hysterectomy    . Breast lumpectomy     History reviewed. No pertinent family history. Social History  Substance Use Topics  . Smoking status: Never Smoker   . Smokeless tobacco: None  . Alcohol Use: No   OB History    No data available     Review of Systems  Constitutional: Negative for fever and chills.  Gastrointestinal: Negative for nausea, vomiting, abdominal pain and diarrhea.  Skin: Positive for rash.  Neurological: Negative for weakness and numbness.      Allergies  Shellfish allergy; Bactrim; and Chocolate flavor  Home Medications   Prior to Admission medications   Medication Sig Start Date End Date Taking? Authorizing Provider  Adalimumab (HUMIRA [place]) Inject 40 mg into the skin every 14 (fourteen) days. Tuesday   Yes Historical Provider, MD  amLODipine (NORVASC) 10 MG tablet Take 10 mg by mouth daily.   Yes Historical Provider, MD   busPIRone (BUSPAR) 10 MG tablet Take 1 tablet by mouth as needed. anxiety   Yes Historical Provider, MD  folic acid (FOLVITE) 1 MG tablet Take 1 mg by mouth daily.   Yes Historical Provider, MD  gabapentin (NEURONTIN) 300 MG capsule Take 900 mg by mouth at bedtime as needed. For nerve pain 04/22/15  Yes Historical Provider, MD  ibuprofen (ADVIL,MOTRIN) 800 MG tablet Take 800 mg by mouth 3 (three) times daily.   Yes Historical Provider, MD  letrozole (FEMARA) 2.5 MG tablet Take 1 tablet by mouth daily. 04/05/15  Yes Historical Provider, MD  methotrexate (50 MG/ML) 1 G injection Inject 20 mg into the vein once a week.   Yes Historical Provider, MD  sertraline (ZOLOFT) 100 MG tablet Take 200 mg by mouth daily.  01/06/15  Yes Historical Provider, MD  tiZANidine (ZANAFLEX) 4 MG tablet Take 4 mg by mouth 3 (three) times daily.  01/06/15  Yes Historical Provider, MD  valACYclovir (VALTREX) 500 MG tablet Take 500 mg by mouth daily. 03/24/15  Yes Historical Provider, MD  cyclobenzaprine (FLEXERIL) 10 MG tablet Take 1 tablet by mouth 3 (three) times daily as needed. For muscle spasm 05/13/15   Historical Provider, MD  hydrOXYzine (ATARAX/VISTARIL) 25 MG tablet Take 1 tablet (25 mg total) by mouth every 6 (six) hours as needed. 04/24/16   Kalman Drape, PA  oxyCODONE-acetaminophen (PERCOCET) 5-325 MG per tablet  Take 1-2 tablets by mouth every 6 (six) hours as needed. Patient not taking: Reported on 04/24/2016 01/14/15   Wandra Arthurs, MD  predniSONE (DELTASONE) 20 MG tablet Take 3 pills daily for 4 days, then 2 pills for 3 days, then 1 pill for 3 days.  Start on 04/19/16. 04/24/16   Fraser Din Phila Shoaf, PA   BP 138/78 mmHg  Pulse 98  Temp(Src) 98.4 F (36.9 C) (Oral)  Resp 16  Ht 5\' 3"  (1.6 m)  Wt 87.998 kg  BMI 34.37 kg/m2  SpO2 96%  LMP 06/11/2013 Physical Exam  Constitutional: She appears well-developed and well-nourished. No distress.  HENT:  Head: Normocephalic and atraumatic.  Eyes: Conjunctivae are normal.   Neck: Normal range of motion.  Pulmonary/Chest: Effort normal.  Musculoskeletal: Normal range of motion. She exhibits no edema.  Neurological: She is alert. Coordination normal.  Skin: Skin is warm and dry.  papulovesicular rash on an erythematous base noted to inner bilateral thighs, to lower back, bilateral forearms and backs of hands. Redness and mild edema noted to face and eyelids  Psychiatric: She has a normal mood and affect. Her behavior is normal.    ED Course  Procedures (including critical care time) Labs Review Labs Reviewed - No data to display  Imaging Review No results found. I have personally reviewed and evaluated these images and lab results as part of my medical decision-making.   EKG Interpretation None      MDM   Final diagnoses:  Allergic dermatitis   Rash likely Allergic dermatitis that is spreading. Instructed pt to not scratch affected areas as it spreads easily. No signs of secondary infection. Follow up with PCP and Dermatology in 2-3 days. Discharged with atarax and prednisone taper. Return precautions discussed. Pt is safe for discharge at this time.            Kalman Drape, PA 04/25/16 1127  Isla Pence, MD 04/28/16 [phone]

## 2016-04-24 NOTE — ED Notes (Signed)
Pt here from home with c/o facial swelling and rash to arms, legs, and trunk. Pt reports she was trimming bushes last week and thinks she may have contracted poison oak. Pt woke up today with swelling to the eyes. Pt reports she has been taking prednisone at home.

## 2016-05-07 ENCOUNTER — Emergency Department (HOSPITAL_COMMUNITY)
Admission: EM | Admit: 2016-05-07 | Discharge: 2016-05-07 | Disposition: A | Discharge status: Home / Self Care | Payer: PRIVATE HEALTH INSURANCE | Attending: Emergency Medicine | Admitting: Emergency Medicine

## 2016-05-07 ENCOUNTER — Encounter (HOSPITAL_COMMUNITY): Payer: Self-pay | Admitting: Emergency Medicine

## 2016-05-07 DIAGNOSIS — Z79899 Other long term (current) drug therapy: Secondary | ICD-10-CM | POA: Insufficient documentation

## 2016-05-07 DIAGNOSIS — Z853 Personal history of malignant neoplasm of breast: Secondary | ICD-10-CM | POA: Insufficient documentation

## 2016-05-07 DIAGNOSIS — G43101 Migraine with aura, not intractable, with status migrainosus: Secondary | ICD-10-CM | POA: Insufficient documentation

## 2016-05-07 DIAGNOSIS — I10 Essential (primary) hypertension: Secondary | ICD-10-CM | POA: Insufficient documentation

## 2016-05-07 MED ORDER — OXYCODONE-ACETAMINOPHEN 5-325 MG PO TABS
1.0000 | ORAL_TABLET | Freq: Once | ORAL | Status: AC
  Administered 2016-05-07: 1 via ORAL

## 2016-05-07 MED ORDER — AMLODIPINE BESYLATE 10 MG PO TABS: 10.0000 mg | ORAL_TABLET | Freq: Every day | ORAL | Status: DC

## 2016-05-07 MED ORDER — OXYCODONE-ACETAMINOPHEN 5-325 MG PO TABS
ORAL_TABLET | ORAL | Status: AC
  Filled 2016-05-07: qty 1

## 2016-05-07 MED ORDER — DIPHENHYDRAMINE HCL 25 MG PO CAPS
25.0000 mg | ORAL_CAPSULE | Freq: Once | ORAL | Status: AC
  Administered 2016-05-07: 25 mg via ORAL
  Filled 2016-05-07: qty 1

## 2016-05-07 MED ORDER — METOCLOPRAMIDE HCL 5 MG/ML IJ SOLN
10.0000 mg | Freq: Once | INTRAMUSCULAR | Status: AC
  Administered 2016-05-07: 10 mg via INTRAVENOUS
  Filled 2016-05-07: qty 2

## 2016-05-07 MED ORDER — ONDANSETRON HCL 4 MG PO TABS
4.0000 mg | ORAL_TABLET | Freq: Once | ORAL | Status: AC
  Administered 2016-05-07: 4 mg via ORAL
  Filled 2016-05-07: qty 1

## 2016-05-07 MED ORDER — SODIUM CHLORIDE 0.9 % IV BOLUS (SEPSIS)
1000.0000 mL | Freq: Once | INTRAVENOUS | Status: AC
  Administered 2016-05-07: 1000 mL via INTRAVENOUS

## 2016-05-07 NOTE — ED Notes (Signed)
Pt. Stated, It  (meds) has helped some.

## 2016-05-07 NOTE — ED Notes (Signed)
Patient refused for me to get repeat V/S in triage waiting area, I told her I can pull her to side ,she told me no, because we wasn't in a room.

## 2016-05-07 NOTE — ED Notes (Signed)
Gave pt a urine cup to collect sample

## 2016-05-07 NOTE — ED Provider Notes (Signed)
CSN: [id number]     Arrival date & time 05/07/16  1232 History   First MD Initiated Contact with Patient 05/07/16 1544     Chief Complaint  Patient presents with  . Headache  . Nausea  . Emesis     (Consider location/radiation/quality/duration/timing/severity/associated sxs/prior Treatment) Patient is a 40 y.o. female presenting with headaches, vomiting, and migraines. The history is provided by the patient.  Headache Associated symptoms: nausea, photophobia and vomiting   Associated symptoms: no back pain, no congestion, no cough, no fever, no neck pain, no numbness, no sore throat, no visual change and no weakness   Emesis Associated symptoms: headaches   Associated symptoms: no sore throat   Migraine This is a recurrent problem. The current episode started in the past 7 days. The problem occurs constantly. The problem has been waxing and waning. Associated symptoms include anorexia, headaches, nausea and vomiting. Pertinent negatives include no chest pain, congestion, coughing, fever, neck pain, numbness, rash, sore throat, urinary symptoms, vertigo, visual change or weakness. The symptoms are aggravated by stress (recently laid off). She has tried NSAIDs and rest for the symptoms.    Past Medical History  Diagnosis Date  . Rheumatoid arthritis ([place])   . Hypertension   . Breast cancer ([place])     2013; in remission as of 11/13   Past Surgical History  Procedure Laterality Date  . Left knee surgery    . Abdominal hysterectomy    . Breast lumpectomy     No family history on file. Social History  Substance Use Topics  . Smoking status: Never Smoker   . Smokeless tobacco: None  . Alcohol Use: No   OB History    No data available     Review of Systems  Constitutional: Negative for fever.  HENT: Negative for congestion and sore throat.   Eyes: Positive for photophobia.  Respiratory: Negative for cough.   Cardiovascular: Negative for chest pain.  Gastrointestinal:  Positive for nausea, vomiting and anorexia.  Genitourinary: Negative for dysuria and difficulty urinating.  Musculoskeletal: Negative for back pain and neck pain.  Skin: Negative for rash.  Neurological: Positive for headaches. Negative for vertigo, weakness and numbness.  Psychiatric/Behavioral: Negative for confusion and agitation.      Allergies  Shellfish allergy; Bactrim; and Chocolate flavor  Home Medications   Prior to Admission medications   Medication Sig Start Date End Date Taking? Authorizing Provider  Adalimumab (HUMIRA [place]) Inject 40 mg into the skin every 14 (fourteen) days. Tuesday   Yes Historical Provider, MD  busPIRone (BUSPAR) 10 MG tablet Take 1 tablet by mouth as needed. anxiety   Yes Historical Provider, MD  cyclobenzaprine (FLEXERIL) 10 MG tablet Take 1 tablet by mouth 3 (three) times daily as needed. For muscle spasm 05/13/15  Yes Historical Provider, MD  folic acid (FOLVITE) 1 MG tablet Take 1 mg by mouth daily.   Yes Historical Provider, MD  gabapentin (NEURONTIN) 300 MG capsule Take 900 mg by mouth at bedtime as needed. For nerve pain 04/22/15  Yes Historical Provider, MD  hydrOXYzine (ATARAX/VISTARIL) 25 MG tablet Take 1 tablet (25 mg total) by mouth every 6 (six) hours as needed. 04/24/16  Yes Jessica L Focht, PA  ibuprofen (ADVIL,MOTRIN) 800 MG tablet Take 800 mg by mouth 3 (three) times daily.   Yes Historical Provider, MD  letrozole (FEMARA) 2.5 MG tablet Take 1 tablet by mouth daily. 04/05/15  Yes Historical Provider, MD  methotrexate (50 MG/ML) 1 G injection Inject  20 mg into the vein once a week.   Yes Historical Provider, MD  sertraline (ZOLOFT) 100 MG tablet Take 200 mg by mouth daily.  01/06/15  Yes Historical Provider, MD  tiZANidine (ZANAFLEX) 4 MG tablet Take 4 mg by mouth 3 (three) times daily.  01/06/15  Yes Historical Provider, MD  valACYclovir (VALTREX) 500 MG tablet Take 500 mg by mouth daily. 03/24/15  Yes Historical Provider, MD  amLODipine (NORVASC)  10 MG tablet Take 1 tablet (10 mg total) by mouth daily. 05/07/16   Allie Bossier, MD  oxyCODONE-acetaminophen (PERCOCET) 5-325 MG per tablet Take 1-2 tablets by mouth every 6 (six) hours as needed. Patient not taking: Reported on 04/24/2016 01/14/15   Wandra Arthurs, MD  predniSONE (DELTASONE) 20 MG tablet Take 3 pills daily for 4 days, then 2 pills for 3 days, then 1 pill for 3 days.  Start on 04/19/16. Patient not taking: Reported on 05/07/2016 04/24/16   Jessica L Focht, PA   BP 157/88 mmHg  Pulse 89  Temp(Src) 99 F (37.2 C)  Ht 5' 3.25" (1.607 m)  Wt 86.637 kg  BMI 33.55 kg/m2  SpO2 100%  LMP 06/11/2013 Physical Exam  Constitutional: She is oriented to person, place, and time. She appears well-developed and well-nourished. No distress.  HENT:  Head: Normocephalic and atraumatic.  Eyes: Conjunctivae are normal. Pupils are equal, round, and reactive to light.  Neck: Normal range of motion. No tracheal deviation present.  Cardiovascular: Normal rate and normal heart sounds.   No murmur heard. Pulmonary/Chest: Effort normal and breath sounds normal.  Abdominal: Soft. There is no tenderness.  Musculoskeletal: She exhibits no edema.  Neurological: She is alert and oriented to person, place, and time. No cranial nerve deficit. Coordination normal.  Able to ambulate without difficulty. Normal strength in all extremities.   Skin: Skin is warm. She is not diaphoretic.  Psychiatric: She has a normal mood and affect. Her behavior is normal.  Nursing note and vitals reviewed.   ED Course  Procedures (including critical care time) Labs Review Labs Reviewed - No data to display  Imaging Review No results found. I have personally reviewed and evaluated these images and lab results as part of my medical decision-making.   EKG Interpretation None      MDM   Final diagnoses:  Migraine with aura and with status migrainosus, not intractable    Patient presented with migraine for last 3  days. Unilateral, photophobia, causing nausea, waxing and waning. Patient reports complete resolution of symptoms with reglan, zofran, bolus, and benadryl. No neuro deficits or features concerning for intracranial lesion.   Patient discharged home in good condition.   Seen with Dr. Tomi Bamberger.     Allie Bossier, MD 05/08/16 1126  Dorie Rank, MD 05/09/16 1051

## 2016-05-07 NOTE — ED Notes (Signed)
Patient d/c'd from IV and continuous pulse oximetry; patient had already taken off her blood pressure cuff; patient already dressed and waiting for discharge paperwork

## 2016-05-07 NOTE — Discharge Instructions (Signed)

## 2016-05-07 NOTE — ED Notes (Signed)
Pt. Stated, I started having a headache on Saturday and then i started having some nausea, vomiting. I've taken Ibuprofen and today I took a zofran.  Im not as nausea.

## 2016-06-25 ENCOUNTER — Ambulatory Visit: Payer: PRIVATE HEALTH INSURANCE | Admitting: Family Medicine

## 2016-09-22 ENCOUNTER — Encounter (HOSPITAL_COMMUNITY): Payer: Self-pay | Admitting: Emergency Medicine

## 2016-09-22 ENCOUNTER — Emergency Department (HOSPITAL_COMMUNITY)
Admission: EM | Admit: 2016-09-22 | Discharge: 2016-09-22 | Disposition: A | Discharge status: Home / Self Care | Payer: PRIVATE HEALTH INSURANCE | Attending: Emergency Medicine | Admitting: Emergency Medicine

## 2016-09-22 DIAGNOSIS — I10 Essential (primary) hypertension: Secondary | ICD-10-CM | POA: Insufficient documentation

## 2016-09-22 DIAGNOSIS — J069 Acute upper respiratory infection, unspecified: Secondary | ICD-10-CM

## 2016-09-22 DIAGNOSIS — Z79899 Other long term (current) drug therapy: Secondary | ICD-10-CM | POA: Insufficient documentation

## 2016-09-22 DIAGNOSIS — Z853 Personal history of malignant neoplasm of breast: Secondary | ICD-10-CM | POA: Diagnosis not present

## 2016-09-22 DIAGNOSIS — J029 Acute pharyngitis, unspecified: Secondary | ICD-10-CM | POA: Diagnosis not present

## 2016-09-22 LAB — RAPID STREP SCREEN (MED CTR MEBANE ONLY): Streptococcus, Group A Screen (Direct): NEGATIVE

## 2016-09-22 MED ORDER — IBUPROFEN 400 MG PO TABS
600.0000 mg | ORAL_TABLET | Freq: Once | ORAL | Status: AC
  Administered 2016-09-22: 600 mg via ORAL
  Filled 2016-09-22: qty 1

## 2016-09-22 MED ORDER — FEXOFENADINE-PSEUDOEPHED ER 60-120 MG PO TB12: 1.0000 | ORAL_TABLET | Freq: Two times a day (BID) | ORAL | 0 refills | Status: DC | PRN

## 2016-09-22 MED ORDER — DEXAMETHASONE 4 MG PO TABS
12.0000 mg | ORAL_TABLET | Freq: Once | ORAL | Status: AC
  Administered 2016-09-22: 12 mg via ORAL
  Filled 2016-09-22: qty 3

## 2016-09-22 NOTE — ED Provider Notes (Signed)
[place] DEPT Provider Note   CSN: [id number] Arrival date & time: 09/22/16  [id number]     History   Chief Complaint Chief Complaint  Patient presents with  . URI    HPI Joanne Garrett is a 40 y.o. female.  HPI   40yF with cough and sore throat. Cough and congestion for the past 2 weeks. Has been using OTC cold medications w/o much improvement. In the past couple days has had a sore throat and noticed a small amount of blood occasionally when she coughs. Subjective fever. No n/v. No dyspnea. No unusual gel pain or swelling. Denies hx of DVT. No recent surgery. Hx of breast CA, but no active tx.   Past Medical History:  Diagnosis Date  . Breast cancer ([place])    2013; in remission as of 11/13  . Hypertension   . Rheumatoid arthritis ([place])     There are no active problems to display for this patient.   Past Surgical History:  Procedure Laterality Date  . ABDOMINAL HYSTERECTOMY    . BREAST LUMPECTOMY    . left knee surgery      OB History    No data available       Home Medications    Prior to Admission medications   Medication Sig Start Date End Date Taking? Authorizing Provider  acetaminophen (TYLENOL) 325 MG tablet Take 650 mg by mouth every 6 (six) hours as needed for moderate pain or headache.   Yes Historical Provider, MD  Adalimumab (HUMIRA [place]) Inject 40 mg into the skin every 14 (fourteen) days. Tuesday   Yes Historical Provider, MD  amLODipine (NORVASC) 10 MG tablet Take 1 tablet (10 mg total) by mouth daily. 05/07/16  Yes Allie Bossier, MD  folic acid (FOLVITE) 1 MG tablet Take 1 mg by mouth daily.   Yes Historical Provider, MD  methotrexate (50 MG/ML) 1 G injection Inject 20 mg into the vein once a week.   Yes Historical Provider, MD  busPIRone (BUSPAR) 10 MG tablet Take 10 mg by mouth daily as needed (for anxiety).     Historical Provider, MD  hydrOXYzine (ATARAX/VISTARIL) 25 MG tablet Take 1 tablet (25 mg total) by mouth every 6 (six) hours as  needed. Patient not taking: Reported on 09/22/2016 04/24/16   Kalman Drape, PA  oxyCODONE-acetaminophen (PERCOCET) 5-325 MG per tablet Take 1-2 tablets by mouth every 6 (six) hours as needed. Patient not taking: Reported on 09/22/2016 01/14/15   Drenda Freeze, MD  predniSONE (DELTASONE) 20 MG tablet Take 3 pills daily for 4 days, then 2 pills for 3 days, then 1 pill for 3 days.  Start on 04/19/16. Patient not taking: Reported on 09/22/2016 04/24/16   Kalman Drape, PA    Family History No family history on file.  Social History Social History  Substance Use Topics  . Smoking status: Never Smoker  . Smokeless tobacco: Not on file  . Alcohol use No     Allergies   Shellfish allergy; Bactrim [sulfamethoxazole-trimethoprim]; and Chocolate flavor   Review of Systems Review of Systems  All systems reviewed and negative, other than as noted in HPI.  Physical Exam Updated Vital Signs BP (!) 162/103   Pulse 96   Temp 98.2 F (36.8 C)   Resp 21   LMP 06/11/2013   SpO2 100%   Physical Exam  Constitutional: She appears well-developed and well-nourished. No distress.  HENT:  Head: Normocephalic and atraumatic.  Exudative pharyngitis. Uvula midline. Neck supple.  No nodes. Normal sounds voice.   Eyes: Conjunctivae are normal. Right eye exhibits no discharge. Left eye exhibits no discharge.  Neck: Neck supple.  Cardiovascular: Normal rate, regular rhythm and normal heart sounds.  Exam reveals no gallop and no friction rub.   No murmur heard. Pulmonary/Chest: Effort normal and breath sounds normal. No respiratory distress.  Abdominal: Soft. She exhibits no distension. There is no tenderness.  Musculoskeletal: She exhibits no edema or tenderness.  Lower extremities symmetric as compared to each other. No calf tenderness. Negative Homan's. No palpable cords.   Neurological: She is alert.  Skin: Skin is warm and dry.  Psychiatric: She has a normal mood and affect. Her behavior  is normal. Thought content normal.  Nursing note and vitals reviewed.    ED Treatments / Results  Labs (all labs ordered are listed, but only abnormal results are displayed) Labs Reviewed  RAPID STREP SCREEN (NOT AT [facility])    EKG  EKG Interpretation None       Radiology No results found.  Procedures Procedures (including critical care time)  Medications Ordered in ED Medications  ibuprofen (ADVIL,MOTRIN) tablet 600 mg (600 mg Oral Given 09/22/16 0941)  dexamethasone (DECADRON) tablet 12 mg (12 mg Oral Given 09/22/16 0941)     Initial Impression / Assessment and Plan / ED Course  I have reviewed the triage vital signs and the nursing notes.  Pertinent labs & imaging results that were available during my care of the patient were reviewed by me and considered in my medical decision making (see chart for details).  Clinical Course     40yF with 2w hx of URI symptoms and a couple day history of sore throat. Suspect viral URI. Has exudative pharyngitis on exam and reports subjective fever. Will swab. I'm not concerned about serious deep space enck infx/airway.  Abx if positive. Symptomatic tx otherwise.   Final Clinical Impressions(s) / ED Diagnoses   Final diagnoses:  Viral upper respiratory tract infection  Pharyngitis, unspecified etiology    New Prescriptions New Prescriptions   No medications on file     Virgel Manifold, MD 09/22/16 1017

## 2016-09-22 NOTE — ED Triage Notes (Signed)
Pt reports three weeks of upper respiratory infection. Denis hx of asthma or COPD. Pt reports she feels that she has had fevers at time but has not checked. Pt reports productive cough with dark brown mucous. Pt reports post nasal drip with sore throat.

## 2016-09-24 LAB — CULTURE, GROUP A STREP (THRC)

## 2016-10-28 ENCOUNTER — Encounter (HOSPITAL_COMMUNITY): Payer: Self-pay

## 2016-10-28 ENCOUNTER — Emergency Department (HOSPITAL_COMMUNITY)
Admission: EM | Admit: 2016-10-28 | Discharge: 2016-10-28 | Disposition: A | Discharge status: Home / Self Care | Payer: PRIVATE HEALTH INSURANCE | Attending: Emergency Medicine | Admitting: Emergency Medicine

## 2016-10-28 ENCOUNTER — Emergency Department (HOSPITAL_COMMUNITY): Payer: PRIVATE HEALTH INSURANCE

## 2016-10-28 DIAGNOSIS — Z853 Personal history of malignant neoplasm of breast: Secondary | ICD-10-CM | POA: Insufficient documentation

## 2016-10-28 DIAGNOSIS — M722 Plantar fascial fibromatosis: Secondary | ICD-10-CM | POA: Insufficient documentation

## 2016-10-28 DIAGNOSIS — I10 Essential (primary) hypertension: Secondary | ICD-10-CM | POA: Insufficient documentation

## 2016-10-28 MED ORDER — TRAMADOL HCL 50 MG PO TABS: 50.0000 mg | ORAL_TABLET | Freq: Four times a day (QID) | ORAL | 0 refills | Status: DC | PRN

## 2016-10-28 MED ORDER — PREDNISONE 10 MG PO TABS: 20.0000 mg | ORAL_TABLET | Freq: Two times a day (BID) | ORAL | 0 refills | Status: DC

## 2016-10-28 MED ORDER — HYDROCODONE-ACETAMINOPHEN 5-325 MG PO TABS
1.0000 | ORAL_TABLET | Freq: Once | ORAL | Status: AC
  Administered 2016-10-28: 1 via ORAL
  Filled 2016-10-28: qty 1

## 2016-10-28 NOTE — ED Provider Notes (Signed)
[place] DEPT Provider Note    By signing my name below, I, Bea Graff, attest that this documentation has been prepared under the direction and in the presence of [facility] [place]. Electronically Signed: Bea Graff, ED Scribe. 10/28/16. 10:56 PM.   History   Chief Complaint Chief Complaint  Patient presents with  . Foot Pain    The history is provided by the patient and medical records. No language interpreter was used.    HPI Comments:  Joanne Garrett is a 40 y.o. female with PMHx of rheumatoid arthritis who presents to the Emergency Department complaining of right foot pain that began last night while sitting and watching television. She reports the pain started on the lateral foot but is now located in the arch. She reports associated swelling. Pt rates the pain at 8/10. She reports taking Ibuprofen, Robaxin and Vicodin with no significant relief of the pain. She denies modifying factors. She denies fever, chills, nausea, vomiting, wounds, bruising, numbness, tingling or weakness of the right foot or RLE. She denies any trauma, injury or fall. She is ambulatory without assistance.   Past Medical History:  Diagnosis Date  . Breast cancer ([place])    2013; in remission as of 11/13  . Hypertension   . Rheumatoid arthritis ([place])     There are no active problems to display for this patient.   Past Surgical History:  Procedure Laterality Date  . ABDOMINAL HYSTERECTOMY    . BREAST LUMPECTOMY    . left knee surgery      OB History    No data available       Home Medications    Prior to Admission medications   Medication Sig Start Date End Date Taking? Authorizing Provider  acetaminophen (TYLENOL) 325 MG tablet Take 650 mg by mouth every 6 (six) hours as needed for moderate pain or headache.    Historical Provider, MD  Adalimumab (HUMIRA [place]) Inject 40 mg into the skin every 14 (fourteen) days. Tuesday    Historical Provider, MD  amLODipine (NORVASC) 10  MG tablet Take 1 tablet (10 mg total) by mouth daily. 05/07/16   Allie Bossier, MD  busPIRone (BUSPAR) 10 MG tablet Take 10 mg by mouth daily as needed (for anxiety).     Historical Provider, MD  fexofenadine-pseudoephedrine (ALLEGRA-D) 60-120 MG 12 hr tablet Take 1 tablet by mouth 2 (two) times daily as needed (congestion). 09/22/16   Virgel Manifold, MD  folic acid (FOLVITE) 1 MG tablet Take 1 mg by mouth daily.    Historical Provider, MD  hydrOXYzine (ATARAX/VISTARIL) 25 MG tablet Take 1 tablet (25 mg total) by mouth every 6 (six) hours as needed. Patient not taking: Reported on 09/22/2016 04/24/16   Kalman Drape, PA  methotrexate (50 MG/ML) 1 G injection Inject 20 mg into the vein once a week.    Historical Provider, MD  oxyCODONE-acetaminophen (PERCOCET) 5-325 MG per tablet Take 1-2 tablets by mouth every 6 (six) hours as needed. Patient not taking: Reported on 09/22/2016 01/14/15   Drenda Freeze, MD  predniSONE (DELTASONE) 10 MG tablet Take 2 tablets (20 mg total) by mouth 2 (two) times daily with a meal. 10/28/16   Hope Bunnie Pion, NP    Family History History reviewed. No pertinent family history.  Social History Social History  Substance Use Topics  . Smoking status: Never Smoker  . Smokeless tobacco: Never Used  . Alcohol use No     Allergies   Shellfish allergy; Bactrim [sulfamethoxazole-trimethoprim];  and Chocolate flavor   Review of Systems Review of Systems  Constitutional: Negative for chills and fever.  Gastrointestinal: Negative for nausea and vomiting.  Musculoskeletal: Positive for arthralgias.  Skin: Negative for color change and wound.  Neurological: Negative for weakness and numbness.  All other systems reviewed and are negative.    Physical Exam Updated Vital Signs BP 150/94 (BP Location: Left Arm)   Pulse 102   Temp 99.3 F (37.4 C) (Oral)   Resp 20   Ht 5\' 3"  (1.6 m)   Wt 196 lb (88.9 kg)   LMP 06/11/2013   SpO2 100%   BMI 34.72 kg/m    Physical Exam  Constitutional: She is oriented to person, place, and time. She appears well-developed and well-nourished.  Eyes: EOM are normal.  Neck: Neck supple.  Cardiovascular:  DP pulses 2+ bilaterally. Adequate circulation.  Pulmonary/Chest: Effort normal.  Abdominal: Soft. There is no tenderness.  Musculoskeletal: Normal range of motion. She exhibits tenderness. She exhibits no deformity.  No tenderness to right ankle. Tenderness to palpation to plantar aspect and arch of right foot. Full ROM of right ankle and foot. Minimal swelling noted.  Neurological: She is alert and oriented to person, place, and time. No cranial nerve deficit.  Skin: Skin is warm and dry.  Nursing note and vitals reviewed.    ED Treatments / Results  DIAGNOSTIC STUDIES: Oxygen Saturation is 100% on RA, normal by my interpretation.   COORDINATION OF CARE: 10:32 PM- Will X-Ray right foot. Pt verbalizes understanding and agrees to plan.  Medications  HYDROcodone-acetaminophen (NORCO/VICODIN) 5-325 MG per tablet 1 tablet (1 tablet Oral Given 10/28/16 2312)    Labs (all labs ordered are listed, but only abnormal results are displayed) Labs Reviewed - No data to display   Radiology Dg Foot Complete Right  Result Date: 10/28/2016 CLINICAL DATA:  Pain and swelling in the right foot. EXAM: RIGHT FOOT COMPLETE - 3+ VIEW COMPARISON:  None. FINDINGS: There is no evidence of fracture or dislocation. There is no evidence of arthropathy or other focal bone abnormality. Soft tissues swelling is noted medially. IMPRESSION: No acute fracture or dislocation identified about the right foot. Electronically Signed   By: Fidela Salisbury M.D.   On: 10/28/2016 22:52    Procedures Procedures (including critical care time)  Medications Ordered in ED Medications  HYDROcodone-acetaminophen (NORCO/VICODIN) 5-325 MG per tablet 1 tablet (1 tablet Oral Given 10/28/16 2312)     Initial Impression / Assessment and  Plan / ED Course  I have reviewed the triage vital signs and the nursing notes.  Pertinent imaging results that were available during my care of the patient were reviewed by me and considered in my medical decision making (see chart for details).  Clinical Course     Patient X-Ray negative for obvious fracture or dislocation.  Pt advised to follow up with her PCP. Patient given ace wrap while in ED, conservative therapy recommended and discussed. Patient will be discharged home & is agreeable with above plan. Returns precautions discussed. Pt appears safe for discharge.  I personally performed the services described in this documentation, which was scribed in my presence. The recorded information has been reviewed and is accurate.  Final Clinical Impressions(s) / ED Diagnoses   Final diagnoses:  Plantar fasciitis of right foot    New Prescriptions Prednisone   Ashley Murrain, NP 10/28/16 2337    Leo Grosser, MD 10/29/16 [phone]

## 2016-10-28 NOTE — ED Triage Notes (Signed)
Pt complaining of R foot pain x yesterday. Pt denies any injury/trauma to foot. Pt states ambulatory at triage.

## 2016-10-28 NOTE — Discharge Instructions (Signed)
Do not take the narcotic if driving as it will make you sleepy. °

## 2016-12-20 ENCOUNTER — Encounter (HOSPITAL_COMMUNITY): Payer: Self-pay

## 2016-12-20 ENCOUNTER — Emergency Department (HOSPITAL_COMMUNITY): Payer: Self-pay

## 2016-12-20 ENCOUNTER — Emergency Department (HOSPITAL_COMMUNITY)
Admission: EM | Admit: 2016-12-20 | Discharge: 2016-12-20 | Disposition: A | Discharge status: Home / Self Care | Payer: Self-pay | Attending: Emergency Medicine | Admitting: Emergency Medicine

## 2016-12-20 DIAGNOSIS — R1031 Right lower quadrant pain: Secondary | ICD-10-CM

## 2016-12-20 DIAGNOSIS — Z79899 Other long term (current) drug therapy: Secondary | ICD-10-CM | POA: Insufficient documentation

## 2016-12-20 DIAGNOSIS — R319 Hematuria, unspecified: Secondary | ICD-10-CM

## 2016-12-20 DIAGNOSIS — R350 Frequency of micturition: Secondary | ICD-10-CM

## 2016-12-20 DIAGNOSIS — I10 Essential (primary) hypertension: Secondary | ICD-10-CM | POA: Insufficient documentation

## 2016-12-20 DIAGNOSIS — Z853 Personal history of malignant neoplasm of breast: Secondary | ICD-10-CM | POA: Insufficient documentation

## 2016-12-20 LAB — URINALYSIS, ROUTINE W REFLEX MICROSCOPIC
Bilirubin Urine: NEGATIVE
Glucose, UA: NEGATIVE mg/dL
Ketones, ur: NEGATIVE mg/dL
Nitrite: NEGATIVE
Protein, ur: NEGATIVE mg/dL
Specific Gravity, Urine: 1.01 (ref 1.005–1.030)
pH: 6 (ref 5.0–8.0)

## 2016-12-20 LAB — CBC WITH DIFFERENTIAL/PLATELET
Basophils Absolute: 0 10*3/uL (ref 0.0–0.1)
Basophils Relative: 0 %
Eosinophils Absolute: 0.2 10*3/uL (ref 0.0–0.7)
Eosinophils Relative: 3 %
HCT: 38.8 % (ref 36.0–46.0)
Hemoglobin: 12.7 g/dL (ref 12.0–15.0)
Lymphocytes Relative: 50 %
Lymphs Abs: 3.3 10*3/uL (ref 0.7–4.0)
MCH: 27.2 pg (ref 26.0–34.0)
MCHC: 32.7 g/dL (ref 30.0–36.0)
MCV: 83.1 fL (ref 78.0–100.0)
Monocytes Absolute: 0.4 10*3/uL (ref 0.1–1.0)
Monocytes Relative: 7 %
Neutro Abs: 2.6 10*3/uL (ref 1.7–7.7)
Neutrophils Relative %: 40 %
Platelets: 207 10*3/uL (ref 150–400)
RBC: 4.67 MIL/uL (ref 3.87–5.11)
RDW: 13.8 % (ref 11.5–15.5)
WBC: 6.5 10*3/uL (ref 4.0–10.5)

## 2016-12-20 LAB — COMPREHENSIVE METABOLIC PANEL
ALT: 23 U/L (ref 14–54)
AST: 24 U/L (ref 15–41)
Albumin: 4.2 g/dL (ref 3.5–5.0)
Alkaline Phosphatase: 64 U/L (ref 38–126)
Anion gap: 9 (ref 5–15)
BUN: 12 mg/dL (ref 6–20)
CO2: 29 mmol/L (ref 22–32)
Calcium: 9.5 mg/dL (ref 8.9–10.3)
Chloride: 100 mmol/L — ABNORMAL LOW (ref 101–111)
Creatinine, Ser: 0.58 mg/dL (ref 0.44–1.00)
GFR calc Af Amer: >60 mL/min (ref >60)
GFR calc non Af Amer: >60 mL/min (ref >60)
Glucose, Bld: 87 mg/dL (ref 65–99)
Potassium: 3.2 mmol/L — ABNORMAL LOW (ref 3.5–5.1)
Sodium: 138 mmol/L (ref 135–145)
Total Bilirubin: 0.4 mg/dL (ref 0.3–1.2)
Total Protein: 7.4 g/dL (ref 6.5–8.1)

## 2016-12-20 LAB — WET PREP, GENITAL
Clue Cells Wet Prep HPF POC: NONE SEEN
Sperm: NONE SEEN
Trich, Wet Prep: NONE SEEN
Yeast Wet Prep HPF POC: NONE SEEN

## 2016-12-20 MED ORDER — CEPHALEXIN 500 MG PO CAPS: 500.0000 mg | ORAL_CAPSULE | Freq: Four times a day (QID) | ORAL | 0 refills | Status: AC

## 2016-12-20 MED ORDER — ONDANSETRON HCL 4 MG PO TABS: 4.0000 mg | ORAL_TABLET | Freq: Four times a day (QID) | ORAL | 0 refills | Status: DC

## 2016-12-20 NOTE — Discharge Instructions (Signed)
Medications: Keflex, Zofran  Treatment: Take Keflex 4 times daily for 10 days. Make sure to finish all this medication. Take Zofran every 6 hours as needed for nausea or vomiting. Patient to drink clear fluids. A culture was sent of your urine. You will be called in 2-3 days if there needs to be a change in your antibiotic or if any of your STD testing returns positive.  Follow-up: Please follow-up with the women's outpatient clinic (OB/GYN) as needed if you continue to have pain prior to being able to see your primary care provider. Please return to emergency department if you develop any fevers, worsening pain, intractable vomiting or any other concerning symptoms.

## 2016-12-20 NOTE — ED Triage Notes (Signed)
Pt endorses lower right pelvic pain, dysuria and right flank pain. Pt has hx of kidney stones but states "this doesn't feel like that" VSS.

## 2016-12-20 NOTE — ED Notes (Signed)
Patient left at this time with all belongings. 

## 2016-12-20 NOTE — ED Provider Notes (Signed)
[facility] DEPT Provider Note   CSN: [id number] Arrival date & time: 12/20/16  1523   By signing my name below, I, Neta Mends, attest that this documentation has been prepared under the direction and in the presence of Eliezer Mccoy, [name]. Electronically Signed: Neta Mends, ED Scribe. 12/20/2016. 5:53 PM.   History   Chief Complaint Chief Complaint  Patient presents with  . Pelvic Pain  . Dysuria    The history is provided by the patient. No language interpreter was used.   HPI Comments:  Joanne Garrett is a 41 y.o. female with PMHx of kidney stones who presents to the Emergency Department complaining of constant lower right quadrant pain since last night. Pt describes the pain as "burning and pressure." She states it has been coming and going in waves. Pt complains of associated urgency, dysuria, and states that when she urinates her urine sprays in different directions. Pt reports hx of kidney stones and states that she cannot really remember what it feels like. Pt also complains of left breast pain that has been persistent for 2 weeks and states that she is waiting for her insurance to start so she can have a mammogram. Pt reports hx of breast cancer, and reports surgical hx of hysterectomy 4 years ago. She states that her breast pain is not similar to when she had breast cancer. No alleviating factors noted. Pt denies fever, vaginal discharge/bleeding, cough, leg swelling. Patient no longer has a menstrual cycle due to hysterectomy.   Past Medical History:  Diagnosis Date  . Breast cancer ([place])    2013; in remission as of 11/13  . Hypertension   . Rheumatoid arthritis ([place])     There are no active problems to display for this patient.   Past Surgical History:  Procedure Laterality Date  . ABDOMINAL HYSTERECTOMY    . BREAST LUMPECTOMY    . left knee surgery      OB History    No data available       Home Medications    Prior to Admission  medications   Medication Sig Start Date End Date Taking? Authorizing Provider  acetaminophen (TYLENOL) 325 MG tablet Take 650 mg by mouth every 6 (six) hours as needed for moderate pain or headache.    Historical Provider, MD  Adalimumab (HUMIRA [place]) Inject 40 mg into the skin every 14 (fourteen) days. Tuesday    Historical Provider, MD  amLODipine (NORVASC) 10 MG tablet Take 1 tablet (10 mg total) by mouth daily. 05/07/16   Allie Bossier, MD  busPIRone (BUSPAR) 10 MG tablet Take 10 mg by mouth daily as needed (for anxiety).     Historical Provider, MD  cephALEXin (KEFLEX) 500 MG capsule Take 1 capsule (500 mg total) by mouth 4 (four) times daily. 12/20/16 12/30/16  Khaleelah Yowell M Imaad Reuss, [name]  fexofenadine-pseudoephedrine (ALLEGRA-D) 60-120 MG 12 hr tablet Take 1 tablet by mouth 2 (two) times daily as needed (congestion). 09/22/16   Virgel Manifold, MD  folic acid (FOLVITE) 1 MG tablet Take 1 mg by mouth daily.    Historical Provider, MD  hydrOXYzine (ATARAX/VISTARIL) 25 MG tablet Take 1 tablet (25 mg total) by mouth every 6 (six) hours as needed. Patient not taking: Reported on 09/22/2016 04/24/16   Kalman Drape, PA  methotrexate (50 MG/ML) 1 G injection Inject 20 mg into the vein once a week.    Historical Provider, MD  ondansetron (ZOFRAN) 4 MG tablet Take 1 tablet (4 mg total) by  mouth every 6 (six) hours. 12/20/16   Frederica Kuster, [name]  oxyCODONE-acetaminophen (PERCOCET) 5-325 MG per tablet Take 1-2 tablets by mouth every 6 (six) hours as needed. Patient not taking: Reported on 09/22/2016 01/14/15   Drenda Freeze, MD  predniSONE (DELTASONE) 10 MG tablet Take 2 tablets (20 mg total) by mouth 2 (two) times daily with a meal. 10/28/16   Hope Bunnie Pion, NP    Family History History reviewed. No pertinent family history.  Social History Social History  Substance Use Topics  . Smoking status: Never Smoker  . Smokeless tobacco: Never Used  . Alcohol use No     Allergies   Shellfish allergy;  Bactrim [sulfamethoxazole-trimethoprim]; and Chocolate flavor   Review of Systems Review of Systems  Constitutional: Negative for chills and fever.  HENT: Negative for facial swelling and sore throat.   Respiratory: Positive for cough. Negative for shortness of breath.   Cardiovascular: Negative for chest pain and leg swelling.  Gastrointestinal: Negative for abdominal pain, nausea and vomiting.  Genitourinary: Positive for dysuria, flank pain, pelvic pain and urgency. Negative for vaginal bleeding and vaginal discharge.  Musculoskeletal: Negative for back pain.  Skin: Negative for rash and wound.  Neurological: Negative for headaches.  Psychiatric/Behavioral: The patient is not nervous/anxious.   All other systems reviewed and are negative.    Physical Exam Updated Vital Signs BP 150/98 (BP Location: Left Arm)   Pulse 91   Temp 98.7 F (37.1 C) (Oral)   Resp 16   Ht 5' 3.25" (1.607 m)   Wt 88.9 kg   LMP 06/11/2013   SpO2 99%   BMI 34.45 kg/m   Physical Exam  Constitutional: She appears well-developed and well-nourished. No distress.  HENT:  Head: Normocephalic and atraumatic.  Mouth/Throat: Oropharynx is clear and moist. No oropharyngeal exudate.  Eyes: Conjunctivae are normal. Pupils are equal, round, and reactive to light. Right eye exhibits no discharge. Left eye exhibits no discharge. No scleral icterus.  Neck: Normal range of motion. Neck supple. No thyromegaly present.  Cardiovascular: Normal rate, regular rhythm, normal heart sounds and intact distal pulses.  Exam reveals no gallop and no friction rub.   No murmur heard. Pulmonary/Chest: Effort normal and breath sounds normal. No stridor. No respiratory distress. She has no wheezes. She has no rales.  Abdominal: Soft. Bowel sounds are normal. She exhibits no distension. There is tenderness in the right lower quadrant. There is no rebound, no guarding and no CVA tenderness.    Genitourinary: Right adnexum displays  tenderness. Right adnexum displays no mass. No bleeding in the vagina. No vaginal discharge found.  Genitourinary Comments: Chaperone present; R and anterior tenderness  Musculoskeletal: She exhibits no edema.  Lymphadenopathy:    She has no cervical adenopathy.  Neurological: She is alert. Coordination normal.  Skin: Skin is warm and dry. No rash noted. She is not diaphoretic. No pallor.  Psychiatric: She has a normal mood and affect.  Nursing note and vitals reviewed.    ED Treatments / Results  DIAGNOSTIC STUDIES:  Oxygen Saturation is 100% on RA, normal by my interpretation.    COORDINATION OF CARE:  5:51 PM Will order blood work and UA. Discussed treatment plan with pt at bedside and pt agreed to plan.   Labs (all labs ordered are listed, but only abnormal results are displayed) Labs Reviewed  WET PREP, GENITAL - Abnormal; Notable for the following:       Result Value   WBC, Wet Prep  HPF POC FEW (*)    All other components within normal limits  COMPREHENSIVE METABOLIC PANEL - Abnormal; Notable for the following:    Potassium 3.2 (*)    Chloride 100 (*)    All other components within normal limits  URINALYSIS, ROUTINE W REFLEX MICROSCOPIC - Abnormal; Notable for the following:    Color, Urine STRAW (*)    Hgb urine dipstick MODERATE (*)    Leukocytes, UA TRACE (*)    Bacteria, UA RARE (*)    Squamous Epithelial / LPF 0-5 (*)    All other components within normal limits  URINE CULTURE  CBC WITH DIFFERENTIAL/PLATELET  GC/CHLAMYDIA PROBE AMP ([place]) NOT AT [facility]    EKG  EKG Interpretation None       Radiology Ct Renal Stone Study  Result Date: 12/20/2016 CLINICAL DATA:  Right flank pain and dysuria. EXAM: CT ABDOMEN AND PELVIS WITHOUT CONTRAST TECHNIQUE: Multidetector CT imaging of the abdomen and pelvis was performed following the standard protocol without IV contrast. COMPARISON:  04/09/2016 CT, MRI 04/18/2016 FINDINGS: Lower chest: Normal sized cardiac  chambers. No acute pulmonary abnormality. Hepatobiliary: Normal appearance of the gallbladder. No biliary dilatation. Subtle area of ill-defined hypodensity in the left hepatic lobe at site of MRI detected focal nodular hyperplasia. This is not well visualized due to lack of IV contrast. Pancreas: Nonacute Spleen: Normal Adrenals/Urinary Tract: Numerous nonobstructing bilateral renal calculi. No hydroureteronephrosis. No perinephric fat stranding. Urinary bladder is free of stones. Calcifications in the pelvis are consistent with phleboliths given their stability since prior exam. None are conclusively identified within the ureters. Stomach is filled with Stomach/Bowel: No acute abnormality. No bowel obstruction. Normal appendix. Vascular/Lymphatic: No significant vascular findings are present. No enlarged abdominal or pelvic lymph nodes. Reproductive: Status post hysterectomy. No adnexal masses. Other: No abdominal wall hernia or abnormality. No abdominopelvic ascites. Musculoskeletal: No acute or significant osseous findings. Mild broad-based disc bulge at L4-5 without neural foraminal encroachment nor significant canal stenosis. IMPRESSION: Bilateral nonobstructing renal calculi.  No obstructive uropathy. No acute bowel inflammation nor obstruction. The hepatic lesion best seen on the MRI exam within the left hepatic lobe is not well visualized on this unenhanced study. Electronically Signed   By: Ashley Royalty M.D.   On: 12/20/2016 18:34    Procedures Procedures (including critical care time)  Medications Ordered in ED Medications - No data to display   Initial Impression / Assessment and Plan / ED Course  I have reviewed the triage vital signs and the nursing notes.  Pertinent labs & imaging results that were available during my care of the patient were reviewed by me and considered in my medical decision making (see chart for details).     UA shows moderate hematuria, trace leukocytes, too  numerous to count RBCs, 6-30 WBCs, Rare bacteria.Urine culture sent. Wet prep shows few WBCs.CBC unremarkable. Potassium 3.2, hemolyzed. Chloride 100.CT renal stone study shows bilateral nonobstructing renal calculi, no obstructive uropathy; no acute bowel inflammation or obstruction. Suspect passed renal calculi or urinary tract infection. Will treat with Keflex. Strict return precautions given. Follow-up to PCP for recheck. Patient understands and agrees with plan. Patient vitals stable throughout ED course and discharged in satisfactory condition. I discussed patient case with Dr. Kathrynn Humble who guided the patient's management and agrees with plan.  Final Clinical Impressions(s) / ED Diagnoses   Final diagnoses:  RLQ abdominal pain  Urinary frequency  Hematuria, unspecified type    New Prescriptions Discharge Medication List as of  12/20/2016  8:19 PM    START taking these medications   Details  cephALEXin (KEFLEX) 500 MG capsule Take 1 capsule (500 mg total) by mouth 4 (four) times daily., Starting Thu 12/20/2016, Until Sun 12/30/2016, Print    ondansetron (ZOFRAN) 4 MG tablet Take 1 tablet (4 mg total) by mouth every 6 (six) hours., Starting Thu 12/20/2016, Print      I personally performed the services described in this documentation, which was scribed in my presence. The recorded information has been reviewed and is accurate.     Frederica Kuster, [name] 12/20/16 2035    Varney Biles, MD 12/20/16 2043

## 2016-12-21 LAB — GC/CHLAMYDIA PROBE AMP (~~LOC~~) NOT AT ARMC
Chlamydia: NEGATIVE
Neisseria Gonorrhea: NEGATIVE

## 2016-12-22 LAB — URINE CULTURE: Special Requests: NORMAL

## 2017-01-09 ENCOUNTER — Encounter: Payer: Self-pay | Admitting: Family Medicine

## 2017-01-16 ENCOUNTER — Encounter: Payer: Self-pay | Admitting: Family Medicine

## 2017-01-16 ENCOUNTER — Ambulatory Visit (INDEPENDENT_AMBULATORY_CARE_PROVIDER_SITE_OTHER): Payer: 59 | Admitting: Family Medicine

## 2017-01-16 VITALS — BP 124/80 | HR 89 | Ht 63.75 in | Wt 203.0 lb

## 2017-01-16 DIAGNOSIS — Z8639 Personal history of other endocrine, nutritional and metabolic disease: Secondary | ICD-10-CM | POA: Diagnosis not present

## 2017-01-16 DIAGNOSIS — Z0000 Encounter for general adult medical examination without abnormal findings: Secondary | ICD-10-CM | POA: Diagnosis not present

## 2017-01-16 DIAGNOSIS — A6000 Herpesviral infection of urogenital system, unspecified: Secondary | ICD-10-CM | POA: Insufficient documentation

## 2017-01-16 DIAGNOSIS — E041 Nontoxic single thyroid nodule: Secondary | ICD-10-CM | POA: Diagnosis not present

## 2017-01-16 DIAGNOSIS — I10 Essential (primary) hypertension: Secondary | ICD-10-CM | POA: Insufficient documentation

## 2017-01-16 DIAGNOSIS — Z853 Personal history of malignant neoplasm of breast: Secondary | ICD-10-CM | POA: Diagnosis not present

## 2017-01-16 DIAGNOSIS — N644 Mastodynia: Secondary | ICD-10-CM

## 2017-01-16 LAB — POCT URINALYSIS DIPSTICK
Bilirubin, UA: NEGATIVE
Glucose, UA: NEGATIVE
Ketones, UA: NEGATIVE
Leukocytes, UA: NEGATIVE
Nitrite, UA: NEGATIVE
Protein, UA: NEGATIVE
Spec Grav, UA: 1.015
Urobilinogen, UA: NEGATIVE — AB
pH, UA: 7.5

## 2017-01-16 LAB — COMPREHENSIVE METABOLIC PANEL
ALT: 21 U/L (ref 6–29)
AST: 20 U/L (ref 10–30)
Albumin: 4.3 g/dL (ref 3.6–5.1)
Alkaline Phosphatase: 72 U/L (ref 33–115)
BUN: 10 mg/dL (ref 7–25)
CO2: 30 mmol/L (ref 20–31)
Calcium: 9.4 mg/dL (ref 8.6–10.2)
Chloride: 105 mmol/L (ref 98–110)
Creat: 0.56 mg/dL (ref 0.50–1.10)
Glucose, Bld: 84 mg/dL (ref 65–99)
Potassium: 3.7 mmol/L (ref 3.5–5.3)
Sodium: 142 mmol/L (ref 135–146)
Total Bilirubin: 0.3 mg/dL (ref 0.2–1.2)
Total Protein: 7.5 g/dL (ref 6.1–8.1)

## 2017-01-16 LAB — CBC WITH DIFFERENTIAL/PLATELET
Basophils Absolute: 0 cells/uL (ref 0–200)
Basophils Relative: 0 %
Eosinophils Absolute: 204 cells/uL (ref 15–500)
Eosinophils Relative: 3 %
HCT: 40.5 % (ref 35.0–45.0)
Hemoglobin: 13 g/dL (ref 11.7–15.5)
Lymphocytes Relative: 42 %
Lymphs Abs: 2856 cells/uL (ref 850–3900)
MCH: 26.2 pg — ABNORMAL LOW (ref 27.0–33.0)
MCHC: 32.1 g/dL (ref 32.0–36.0)
MCV: 81.7 fL (ref 80.0–100.0)
MPV: 10.6 fL (ref 7.5–12.5)
Monocytes Absolute: 612 cells/uL (ref 200–950)
Monocytes Relative: 9 %
Neutro Abs: 3128 cells/uL (ref 1500–7800)
Neutrophils Relative %: 46 %
Platelets: 248 10*3/uL (ref 140–400)
RBC: 4.96 MIL/uL (ref 3.80–5.10)
RDW: 14.9 % (ref 11.0–15.0)
WBC: 6.8 10*3/uL (ref 4.0–10.5)

## 2017-01-16 LAB — LIPID PANEL
Cholesterol: 207 mg/dL — ABNORMAL HIGH (ref <200)
HDL: 88 mg/dL (ref >50)
LDL Cholesterol: 105 mg/dL — ABNORMAL HIGH (ref <100)
Total CHOL/HDL Ratio: 2.4 Ratio (ref <5.0)
Triglycerides: 69 mg/dL (ref <150)
VLDL: 14 mg/dL (ref <30)

## 2017-01-16 LAB — TSH: TSH: 2.39 mIU/L

## 2017-01-16 MED ORDER — VALACYCLOVIR HCL 1 G PO TABS: 1000.0000 mg | ORAL_TABLET | Freq: Two times a day (BID) | ORAL | 3 refills | Status: DC

## 2017-01-16 NOTE — Progress Notes (Signed)
Subjective:    Patient ID: Joanne Garrett, female    DOB: Jun 07, 1976, 41 y.o.   MRN: [id number]  HPI Chief Complaint  Patient presents with  . new pt    new pt, fasting cpe, breast pain- needs mammogram   She is new to the practice and here for a complete physical exam.  Previous medical care: no medical care for a year She has seen Dr. Ellouise Newer at [facility] in the past. States she was fired from [facility] so she does not want to go back there as a patient.   Last CPE: over a year ago  Concerns today include: left breast pain for the past 3 months ago. States pain is intermittent and is sharp and shooting.  States nothing makes it worse or better. History of lumpectomy to her left breast for breast cancer in the past.  Last mammogram in 2016.  Also complains of a genital herpes outbreak. States she has had sores in her genital area for 2 weeks. Has been out of Valtrex. States she normally has 2 outbreaks per year. Does not take preventive medication.  Last sexual encounter was within the past 6 months. Denies urinary symptoms or vaginal discharge.  No fever, malaise, unexplained weight loss or fatigue.   Other providers: Rheumatologist- Dr. Abner Greenspan with [facility]  GI- Deliah Goody, PA  OB/GYN states this person retired and she needs a new one  Oncologist/Hematologist- Dr. Shary Decamp at [facility]   Past medical history: RA and is taking methotrexate and Humira. This is managed by Dr. Abner Greenspan at [place].  HTN diagnosed 10 years ago. Well controlled per patient.  Breast cancer in the past. Was treated at [facility] with radiation and lumpectomy.  Reports history of thyroid disorder and nodule with benign findings.   Surgeries: breast surgery, knee, wisdom teeth, hysterectomy due to fibroids.    Family history: HTN- brother and father and mother. Kidney disease in father, COPD in mother. 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 Eats a lot of carbohydrates.  Excerise: walks daily 30 minutes   Immunizations:   Health maintenance:  Mammogram: 2016  Colonoscopy: N/A  Last Gynecological Exam: last month  Pregnancies: 0 Last Dental Exam: once annually  Last Eye Exam: 2 years ago  Wears seatbelt always, uses sunscreen, smoke detectors in home and functioning, does not text while driving and feels safe in home environment.   Reviewed allergies, medications, past medical, surgical, family, and social history.     Review of Systems Review of Systems Constitutional: -fever, -chills, -sweats, -unexpected weight change,-fatigue ENT: -runny nose, -ear pain, -sore throat Cardiology:  -chest pain, -palpitations, -edema Respiratory: -cough, -shortness of breath, -wheezing Gastroenterology: -abdominal pain, -nausea, -vomiting, -diarrhea, -constipation  Hematology: -bleeding or bruising problems Musculoskeletal: -arthralgias, -myalgias, -joint swelling, -back pain Ophthalmology: -vision changes Urology: -dysuria, -difficulty urinating, -hematuria, -urinary frequency, -urgency Neurology: -headache, -weakness, -tingling, -numbness       Objective:   Physical Exam BP 124/80   Pulse 89   Ht 5' 3.75" (1.619 m)   Wt 203 lb (92.1 kg)   LMP 06/11/2013   BMI 35.12 kg/m   General Appearance:    Alert, cooperative, no distress, appears stated age  Head:    Normocephalic, without obvious abnormality, atraumatic  Eyes:    PERRL, conjunctiva/corneas clear, EOM's intact, fundi    benign  Ears:    Normal  TM's and external ear canals  Nose:   Nares normal, mucosa normal, no drainage or sinus   tenderness  Throat:   Lips, mucosa, and tongue normal; teeth and gums normal  Neck:   Supple, no lymphadenopathy;  thyroid:  no   Enlargement/tenderness. Left nodule present; no carotid   bruit or JVD  Back:    Spine nontender, no curvature, ROM normal, no CVA     tenderness    Lungs:     Clear to auscultation bilaterally without wheezes, rales or     ronchi; respirations unlabored  Chest Wall:    No tenderness or deformity   Heart:    Regular rate and rhythm, S1 and S2 normal, no murmur, rub   or gallop  Breast Exam:    Right breast tenderness mainly at 12 o'clock, left breast non tender, scarring with indentation to 10 o'clock position, no masses, or nipple discharge or inversion.   No axillary lymphadenopathy  Abdomen:     Soft, non-tender, nondistended, normoactive bowel sounds,    no masses, no hepatosplenomegaly  Genitalia:   external genitalia with shallow ulcerations to bilateral labia.   BUS normal.       Extremities:   No clubbing, cyanosis or edema  Pulses:   2+ and symmetric all extremities  Skin:   Skin color, texture, turgor normal, no rashes or lesions  Lymph nodes:   Cervical, supraclavicular, and axillary nodes normal  Neurologic:   CNII-XII intact, normal strength, sensation and gait; reflexes 2+ and symmetric throughout          Psych:   Normal mood, affect, hygiene and grooming.    Urinalysis dipstick: neg     Assessment & Plan:  Routine general medical examination at a health care facility - Plan: CBC with Differential/Platelet, Comprehensive metabolic panel, POCT urinalysis dipstick, Lipid panel  Essential hypertension - Plan: CBC with Differential/Platelet, Comprehensive metabolic panel  Genital herpes simplex, unspecified site - Plan: RPR, GC/Chlamydia Probe Amp, HIV antibody  History of thyroid disorder - Plan: TSH  Left thyroid nodule - Plan: TSH  Breast pain, left - Plan: MM DIAG BREAST TOMO BILATERAL, US BREAST LTD UNI RIGHT INC AXILLA  History of breast cancer - Plan: US BREAST LTD UNI LEFT INC AXILLA  Overall she appears to be doing well except for obesity and poor diet. Counseled on healthy diet and cutting back on sugar and carbohydrates, add protein and drink water. She is walking daily and I encouraged her to continue  this and increase her pace.  Her blood pressure is under control. Continue current medication.  Lifestyle modifications discussed to help control blood pressure and her overall health.  Prescribed Valtrex for HSV-2 outbreak. She will let me know if this is not healing or if she starts having more frequent outbreaks. She is currently having only 2 per year and prophylactic medication is not desired.  Will send her for a diagnostic mammogram and [place] for breast pain on left with history of breast cancer. She has some breast tenderness on the right as well.  Thyroid disorder and nodule history is unclear. I cannot find this in her record. Will check TSH and question patient about this.  Appears to be up to date on immunizations.  Discussed safety and health promotion.  STD testing performed. Pap not indicated due to hysterectomy.  Follow up pending labs.

## 2017-01-16 NOTE — Patient Instructions (Addendum)
Start getting at least 150 minutes of physical activity per week.  Cut back on carbohydrates (potatoes, rice, pasta, bread) and include protein in your meals.  We will call you with your lab results.   Take the Valtrex twice daily during an outbreak for 5-7 days. If you start having more frequent outbreaks then let me know.    Preventative Care for Adults - Female      MAINTAIN REGULAR HEALTH EXAMS:  A routine yearly physical is a good way to check in with your primary care provider about your health and preventive screening. It is also an opportunity to share updates about your health and any concerns you have, and receive a thorough all-over exam.   Most health insurance companies pay for at least some preventative services.  Check with your health plan for specific coverages.  WHAT PREVENTATIVE SERVICES DO WOMEN NEED?  Adult women should have their weight and blood pressure checked regularly.   Women age 42 and older should have their cholesterol levels checked regularly.  Women should be screened for cervical cancer with a Pap smear and pelvic exam beginning at either age 42, or 3 years after they become sexually activity.    Breast cancer screening generally begins at age 42 with a mammogram and breast exam by your primary care provider.    Beginning at age 42 and continuing to age 27, women should be screened for colorectal cancer.  Certain people may need continued testing until age 42.  Updating vaccinations is part of preventative care.  Vaccinations help protect against diseases such as the flu.  Osteoporosis is a disease in which the bones lose minerals and strength as we age. Women ages 42 and over should discuss this with their caregivers, as should women after menopause who have other risk factors.  Lab tests are generally done as part of preventative care to screen for anemia and blood disorders, to screen for problems with the kidneys and liver, to screen for bladder  problems, to check blood sugar, and to check your cholesterol level.  Preventative services generally include counseling about diet, exercise, avoiding tobacco, drugs, excessive alcohol consumption, and sexually transmitted infections.    GENERAL RECOMMENDATIONS FOR GOOD HEALTH:  Healthy diet:  Eat a variety of foods, including fruit, vegetables, animal or vegetable protein, such as meat, fish, chicken, and eggs, or beans, lentils, tofu, and grains, such as rice.  Drink plenty of water daily.  Decrease saturated fat in the diet, avoid lots of red meat, processed foods, sweets, fast foods, and fried foods.  Exercise:  Aerobic exercise helps maintain good heart health. At least 30-40 minutes of moderate-intensity exercise is recommended. For example, a brisk walk that increases your heart rate and breathing. This should be done on most days of the week.   Find a type of exercise or a variety of exercises that you enjoy so that it becomes a part of your daily life.  Examples are running, walking, swimming, water aerobics, and biking.  For motivation and support, explore group exercise such as aerobic class, spin class, Zumba, Yoga,or  martial arts, etc.    Set exercise goals for yourself, such as a certain weight goal, walk or run in a race such as a 5k walk/run.  Speak to your primary care provider about exercise goals.  Disease prevention:  If you smoke or chew tobacco, find out from your caregiver how to quit. It can literally save your life, no matter how long you have  been a tobacco user. If you do not use tobacco, never begin.   Maintain a healthy diet and normal weight. Increased weight leads to problems with blood pressure and diabetes.   The Body Mass Index or BMI is a way of measuring how much of your body is fat. Having a BMI above 27 increases the risk of heart disease, diabetes, hypertension, stroke and other problems related to obesity. Your caregiver can help determine your  BMI and based on it develop an exercise and dietary program to help you achieve or maintain this important measurement at a healthful level.  High blood pressure causes heart and blood vessel problems.  Persistent high blood pressure should be treated with medicine if weight loss and exercise do not work.   Fat and cholesterol leaves deposits in your arteries that can block them. This causes heart disease and vessel disease elsewhere in your body.  If your cholesterol is found to be high, or if you have heart disease or certain other medical conditions, then you may need to have your cholesterol monitored frequently and be treated with medication.   Ask if you should have a cardiac stress test if your history suggests this. A stress test is a test done on a treadmill that looks for heart disease. This test can find disease prior to there being a problem.  Menopause can be associated with physical symptoms and risks. Hormone replacement therapy is available to decrease these. You should talk to your caregiver about whether starting or continuing to take hormones is right for you.   Osteoporosis is a disease in which the bones lose minerals and strength as we age. This can result in serious bone fractures. Risk of osteoporosis can be identified using a bone density scan. Women ages 42 and over should discuss this with their caregivers, as should women after menopause who have other risk factors. Ask your caregiver whether you should be taking a calcium supplement and Vitamin D, to reduce the rate of osteoporosis.   Avoid drinking alcohol in excess (more than two drinks per day).  Avoid use of street drugs. Do not share needles with anyone. Ask for professional help if you need assistance or instructions on stopping the use of alcohol, cigarettes, and/or drugs.  Brush your teeth twice a day with fluoride toothpaste, and floss once a day. Good oral hygiene prevents tooth decay and gum disease. The problems  can be painful, unattractive, and can cause other health problems. Visit your dentist for a routine oral and dental check up and preventive care every 6-12 months.   Look at your skin regularly.  Use a mirror to look at your back. Notify your caregivers of changes in moles, especially if there are changes in shapes, colors, a size larger than a pencil eraser, an irregular border, or development of new moles.  Safety:  Use seatbelts 100% of the time, whether driving or as a passenger.  Use safety devices such as hearing protection if you work in environments with loud noise or significant background noise.  Use safety glasses when doing any work that could send debris in to the eyes.  Use a helmet if you ride a bike or motorcycle.  Use appropriate safety gear for contact sports.  Talk to your caregiver about gun safety.  Use sunscreen with a SPF (or skin protection factor) of 15 or greater.  Lighter skinned people are at a greater risk of skin cancer. Don't forget to also wear sunglasses in order  to protect your eyes from too much damaging sunlight. Damaging sunlight can accelerate cataract formation.   Practice safe sex. Use condoms. Condoms are used for birth control and to help reduce the spread of sexually transmitted infections (or STIs).  Some of the STIs are gonorrhea (the clap), chlamydia, syphilis, trichomonas, herpes, HPV (human papilloma virus) and HIV (human immunodeficiency virus) which causes AIDS. The herpes, HIV and HPV are viral illnesses that have no cure. These can result in disability, cancer and death.   Keep carbon monoxide and smoke detectors in your home functioning at all times. Change the batteries every 6 months or use a model that plugs into the wall.   Vaccinations:  Stay up to date with your tetanus shots and other required immunizations. You should have a booster for tetanus every 10 years. Be sure to get your flu shot every year, since 5%-20% of the [place] population  comes down with the flu. The flu vaccine changes each year, so being vaccinated once is not enough. Get your shot in the fall, before the flu season peaks.   Other vaccines to consider:  Human Papilloma Virus or HPV causes cancer of the cervix, and other infections that can be transmitted from person to person. There is a vaccine for HPV, and females should get immunized between the ages of 51 and 50. It requires a series of 3 shots.   Pneumococcal vaccine to protect against certain types of pneumonia.  This is normally recommended for adults age 36 or older.  However, adults younger than 42 years old with certain underlying conditions such as diabetes, heart or lung disease should also receive the vaccine.  Shingles vaccine to protect against Varicella Zoster if you are older than age 36, or younger than 42 years old with certain underlying illness.  Hepatitis A vaccine to protect against a form of infection of the liver by a virus acquired from food.  Hepatitis B vaccine to protect against a form of infection of the liver by a virus acquired from blood or body fluids, particularly if you work in health care.  If you plan to travel internationally, check with your local health department for specific vaccination recommendations.  Cancer Screening:  Breast cancer screening is essential to preventive care for women. All women age 13 and older should perform a breast self-exam every month. At age 52 and older, women should have their caregiver complete a breast exam each year. Women at ages 60 and older should have a mammogram (x-ray film) of the breasts. Your caregiver can discuss how often you need mammograms.    Cervical cancer screening includes taking a Pap smear (sample of cells examined under a microscope) from the cervix (end of the uterus). It also includes testing for HPV (Human Papilloma Virus, which can cause cervical cancer). Screening and a pelvic exam should begin at age 6, or 3  years after a woman becomes sexually active. Screening should occur every year, with a Pap smear but no HPV testing, up to age 42. After age 67, you should have a Pap smear every 3 years with HPV testing, if no HPV was found previously.   Most routine colon cancer screening begins at the age of 57. On a yearly basis, doctors may provide special easy to use take-home tests to check for hidden blood in the stool. Sigmoidoscopy or colonoscopy can detect the earliest forms of colon cancer and is life saving. These tests use a small camera at the end of  a tube to directly examine the colon. Speak to your caregiver about this at age 62, when routine screening begins (and is repeated every 5 years unless early forms of pre-cancerous polyps or small growths are found).

## 2017-01-17 ENCOUNTER — Encounter: Payer: Self-pay | Admitting: Internal Medicine

## 2017-01-17 LAB — GC/CHLAMYDIA PROBE AMP
CT Probe RNA: NOT DETECTED
GC Probe RNA: NOT DETECTED

## 2017-01-17 LAB — RPR

## 2017-01-17 LAB — HIV ANTIBODY (ROUTINE TESTING W REFLEX): HIV 1&2 Ab, 4th Generation: NONREACTIVE

## 2017-01-18 ENCOUNTER — Other Ambulatory Visit: Payer: PRIVATE HEALTH INSURANCE

## 2017-01-31 ENCOUNTER — Ambulatory Visit
Admission: RE | Admit: 2017-01-31 | Discharge: 2017-01-31 | Disposition: A | Discharge status: Home / Self Care | Payer: Managed Care, Other (non HMO) | Source: Ambulatory Visit | Attending: Family Medicine | Admitting: Family Medicine

## 2017-01-31 DIAGNOSIS — N644 Mastodynia: Secondary | ICD-10-CM

## 2017-01-31 DIAGNOSIS — Z853 Personal history of malignant neoplasm of breast: Secondary | ICD-10-CM

## 2017-02-13 ENCOUNTER — Encounter: Payer: Self-pay | Admitting: Family Medicine

## 2017-02-13 ENCOUNTER — Ambulatory Visit (INDEPENDENT_AMBULATORY_CARE_PROVIDER_SITE_OTHER): Payer: 59 | Admitting: Family Medicine

## 2017-02-13 VITALS — BP 120/82 | HR 82 | Wt 203.8 lb

## 2017-02-13 DIAGNOSIS — F410 Panic disorder [episodic paroxysmal anxiety] without agoraphobia: Secondary | ICD-10-CM

## 2017-02-13 DIAGNOSIS — F419 Anxiety disorder, unspecified: Secondary | ICD-10-CM

## 2017-02-13 DIAGNOSIS — Z62810 Personal history of physical and sexual abuse in childhood: Secondary | ICD-10-CM | POA: Diagnosis not present

## 2017-02-13 DIAGNOSIS — IMO0002 Reserved for concepts with insufficient information to code with codable children: Secondary | ICD-10-CM

## 2017-02-13 MED ORDER — VENLAFAXINE HCL ER 37.5 MG PO CP24: 37.5000 mg | ORAL_CAPSULE | Freq: Every day | ORAL | 0 refills | Status: DC

## 2017-02-13 MED ORDER — ALPRAZOLAM 0.25 MG PO TABS: 0.2500 mg | ORAL_TABLET | Freq: Two times a day (BID) | ORAL | 0 refills | Status: DC | PRN

## 2017-02-13 NOTE — Patient Instructions (Signed)
You can call to schedule your appointment with the Counselor. A couple offices are listed below for you to call.    [facility]  [address] [place], [place] [address]      Phone: [phone]   [facility]  [address] [place], [place], [place] [address]  Phone: [phone]  [facility] [address] [place],  [address]  Phone: [place]  [facility] [place]  [phone]
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
## 2017-02-27 ENCOUNTER — Ambulatory Visit: Payer: 59 | Admitting: Family Medicine

## 2017-03-04 ENCOUNTER — Ambulatory Visit: Payer: 59 | Admitting: Family Medicine

## 2017-03-07 ENCOUNTER — Encounter: Payer: Self-pay | Admitting: Family Medicine

## 2017-03-07 ENCOUNTER — Ambulatory Visit (INDEPENDENT_AMBULATORY_CARE_PROVIDER_SITE_OTHER): Payer: 59 | Admitting: Family Medicine

## 2017-03-07 VITALS — BP 120/80 | HR 81 | Wt 204.0 lb

## 2017-03-07 DIAGNOSIS — F419 Anxiety disorder, unspecified: Secondary | ICD-10-CM

## 2017-03-07 DIAGNOSIS — F32A Depression, unspecified: Secondary | ICD-10-CM

## 2017-03-07 DIAGNOSIS — F329 Major depressive disorder, single episode, unspecified: Secondary | ICD-10-CM | POA: Diagnosis not present

## 2017-03-07 NOTE — Progress Notes (Signed)
   Subjective:    Patient ID: Joanne Garrett, female    DOB: 11/27/1975, 41 y.o.   MRN: [id number]  HPI Chief Complaint  Patient presents with  . 2 week follow-up    2 week follow-up on anxiety.    She is here to follow up on anxiety and starting new medication. States she feels some better but now is feeling more depression than anxiety. States she has been wanting to sleep a lot but is still going to work and out to dinner with friends, walking her dog etc. Denies any worsening symptoms than prior to starting medication.  She has scheduled an appointment with a psychiatrist and states they cannot see her until July. Would like to get in somewhere sooner.  Denies SI or HI.  Denies fever, chills, headache, dizziness, chest pain, palpitations, abdominal pain, N/V/D.   Has an appointment with her rheumatologist in July.   Reviewed allergies, medications, past medical, surgical, and social history.   Review of Systems Pertinent positives and negatives in the history of present illness.     Objective:   Physical Exam  Constitutional: She appears well-developed and well-nourished. No distress.  Skin: Skin is warm and dry.  Psychiatric: She has a normal mood and affect. Her speech is normal and behavior is normal. Thought content normal. She expresses no homicidal and no suicidal ideation.   BP 120/80   Pulse 81   Wt 204 lb (92.5 kg)   LMP 06/11/2013   BMI 35.29 kg/m       Assessment & Plan:  Anxiety and depression  Offered to increase Effexor dose but patient does not want to do this right now. Will keep on her current medication. Recommend that she schedule something that gives her pleasure such as a massage, yoga or take more walks. She has an appointment with a psychiatrist in July but was given other offices to contact to see if she can get in somewhere sooner. She does not appear to be in any danger or harmful to herself or others. Advised her to go to the [facility] ED if she  gets worse or has worrisome thoughts. She verbalized understanding.  Will follow up as needed.

## 2017-03-07 NOTE — Patient Instructions (Signed)
You can try the following offices and see if they can get you in sooner than July.  If you feel like you need to see someone right away, please go to [facility] emergency department at [address] in [place].   [facility]  [address] [place], [place] [address]                                                             Phone: [phone]   [facility]  [address] [place], [place], [place] [address]  Phone: [phone]  [facility] [address] [place], [place] [address]  Phone: [place] [phone]

## 2017-03-18 ENCOUNTER — Other Ambulatory Visit: Payer: Self-pay | Admitting: Family Medicine

## 2017-03-18 DIAGNOSIS — F410 Panic disorder [episodic paroxysmal anxiety] without agoraphobia: Secondary | ICD-10-CM

## 2017-03-18 DIAGNOSIS — F419 Anxiety disorder, unspecified: Secondary | ICD-10-CM

## 2017-03-19 ENCOUNTER — Telehealth: Payer: Self-pay | Admitting: Family Medicine

## 2017-03-19 NOTE — Telephone Encounter (Signed)
Pt called to let Vickie know that she will have her 1st therapy appointment tomorrow. Pt wanted to thank Vickie for everything that she is doing to help her especially considering how upset the pt was yesterday.

## 2017-03-19 NOTE — Telephone Encounter (Signed)
Ok to refill 

## 2017-03-19 NOTE — Telephone Encounter (Signed)
Is this okay to refill? 

## 2017-03-19 NOTE — Telephone Encounter (Signed)
Called in med to pharmacy  

## 2017-04-05 DIAGNOSIS — M0579 Rheumatoid arthritis with rheumatoid factor of multiple sites without organ or systems involvement: Secondary | ICD-10-CM | POA: Insufficient documentation

## 2017-04-05 DIAGNOSIS — Z90710 Acquired absence of both cervix and uterus: Secondary | ICD-10-CM | POA: Insufficient documentation

## 2017-04-15 ENCOUNTER — Encounter (HOSPITAL_COMMUNITY): Payer: Self-pay | Admitting: Emergency Medicine

## 2017-04-15 ENCOUNTER — Ambulatory Visit (HOSPITAL_COMMUNITY)
Admission: EM | Admit: 2017-04-15 | Discharge: 2017-04-15 | Disposition: A | Discharge status: Home / Self Care | Payer: 59 | Attending: Internal Medicine | Admitting: Internal Medicine

## 2017-04-15 DIAGNOSIS — S39012A Strain of muscle, fascia and tendon of lower back, initial encounter: Secondary | ICD-10-CM

## 2017-04-15 DIAGNOSIS — G8929 Other chronic pain: Secondary | ICD-10-CM

## 2017-04-15 DIAGNOSIS — M25562 Pain in left knee: Secondary | ICD-10-CM | POA: Diagnosis not present

## 2017-04-15 DIAGNOSIS — M25552 Pain in left hip: Secondary | ICD-10-CM

## 2017-04-15 DIAGNOSIS — M069 Rheumatoid arthritis, unspecified: Secondary | ICD-10-CM | POA: Diagnosis not present

## 2017-04-15 DIAGNOSIS — M25561 Pain in right knee: Secondary | ICD-10-CM

## 2017-04-15 DIAGNOSIS — M25551 Pain in right hip: Secondary | ICD-10-CM | POA: Diagnosis not present

## 2017-04-15 MED ORDER — KETOROLAC TROMETHAMINE 60 MG/2ML IM SOLN
INTRAMUSCULAR | Status: AC
  Filled 2017-04-15: qty 2

## 2017-04-15 MED ORDER — CYCLOBENZAPRINE HCL 10 MG PO TABS: 10.0000 mg | ORAL_TABLET | Freq: Two times a day (BID) | ORAL | 0 refills | Status: DC | PRN

## 2017-04-15 MED ORDER — KETOROLAC TROMETHAMINE 60 MG/2ML IM SOLN
60.0000 mg | Freq: Once | INTRAMUSCULAR | Status: AC
  Administered 2017-04-15: 60 mg via INTRAMUSCULAR

## 2017-04-15 MED ORDER — METHYLPREDNISOLONE 4 MG PO TBPK: ORAL_TABLET | ORAL | 0 refills | Status: DC

## 2017-04-15 NOTE — ED Provider Notes (Signed)
CSN: [id number]     Arrival date & time 04/15/17  1758 History   None    Chief Complaint  Patient presents with  . Back Pain   (Consider location/radiation/quality/duration/timing/severity/associated sxs/prior Treatment) Patient has hx of RA and is having polyarthralgias.  She is awaiting her RA meds to be authorized and she is havin back ,knee, and hip pain.   The history is provided by the patient.  Back Pain  Location:  Lumbar spine Quality:  Aching Radiates to:  Does not radiate Pain severity:  Moderate Pain is:  Worse during the day Onset quality:  Sudden   Past Medical History:  Diagnosis Date  . Breast cancer ([facility])    2013; in remission as of 11/13  . Genital herpes   . History of thyroid nodule   . Hypertension   . Kidney stones   . Rheumatoid arthritis [facility])    Past Surgical History:  Procedure Laterality Date  . ABDOMINAL HYSTERECTOMY    . BREAST LUMPECTOMY Left 2013  . left knee surgery    . NECK SURGERY     C5-C6-C7 and has hardware    Family History  Problem Relation Age of Onset  . Hypertension Mother   . COPD Mother   . Hypertension Father   . Kidney disease Father   . Hypertension Brother   . Alzheimer's disease Paternal Grandmother    Social History  Substance Use Topics  . Smoking status: Never Smoker  . Smokeless tobacco: Never Used  . Alcohol use No   OB History    No data available     Review of Systems  Constitutional: Negative.   HENT: Negative.   Eyes: Negative.   Respiratory: Negative.   Cardiovascular: Negative.   Gastrointestinal: Negative.   Endocrine: Negative.   Genitourinary: Negative.   Musculoskeletal: Positive for arthralgias and back pain.  Allergic/Immunologic: Negative.   Neurological: Negative.   Hematological: Negative.   Psychiatric/Behavioral: Negative.     Allergies  Shellfish allergy; Bactrim [sulfamethoxazole-trimethoprim]; and Chocolate flavor  Home Medications   Prior to Admission medications    Medication Sig Start Date End Date Taking? Authorizing Provider  LORazepam (ATIVAN) 1 MG tablet Take 1 mg by mouth every 8 (eight) hours.   Yes [provider]  Adalimumab (HUMIRA [place]) Inject 20 mg into the skin every 14 (fourteen) days. Tuesday     [provider]  ALPRAZolam (XANAX) 0.25 MG tablet TAKE 1 TABLET BY MOUTH TWICE DAILY AS NEEDED FOR ANXIETY 03/19/17   Henson, Vickie L, NP-C  amLODipine (NORVASC) 10 MG tablet Take 1 tablet (10 mg total) by mouth daily. 05/07/16   Allie Bossier, MD  cyclobenzaprine (FLEXERIL) 10 MG tablet Take 1 tablet (10 mg total) by mouth 2 (two) times daily as needed for muscle spasms. 04/15/17   Lysbeth Penner, FNP  methotrexate (50 MG/ML) 1 G injection Inject 40 mg into the vein once a week.     [provider]  methylPREDNISolone (MEDROL DOSEPAK) 4 MG TBPK tablet Take 6-5-4-3-2-1 po qd 04/15/17   Lysbeth Penner, FNP  Multiple Vitamin (MULTIVITAMIN) tablet Take 1 tablet by mouth daily.    [provider]  valACYclovir (VALTREX) 1000 MG tablet Take 1 tablet (1,000 mg total) by mouth 2 (two) times daily. X 5 days during an outbreak 01/16/17   Harland Dingwall L, NP-C  venlafaxine XR (EFFEXOR-XR) 37.5 MG 24 hr capsule TAKE 1 CAPSULE BY MOUTH ONCE DAILY WITH BREAKFAST 03/19/17   Harland Dingwall  L, NP-C   Meds Ordered and Administered this Visit   Medications  ketorolac (TORADOL) injection 60 mg (60 mg Intramuscular Given 04/15/17 2009)    BP (!) 151/94 (BP Location: Left Arm)   Pulse 81   Temp 98.5 F (36.9 C) (Oral)   Resp 18   LMP 06/11/2013   SpO2 98%  No data found.   Physical Exam  Constitutional: She appears well-developed and well-nourished.  HENT:  Head: Normocephalic and atraumatic.  Eyes: Conjunctivae and EOM are normal. Pupils are equal, round, and reactive to light.  Neck: Normal range of motion. Neck supple.  Cardiovascular: Normal rate, regular rhythm and normal heart sounds.   Pulmonary/Chest: Effort  normal and breath sounds normal.  Musculoskeletal: She exhibits tenderness.  TTP bilatearal hips, back, and knees.  Nursing note and vitals reviewed.   Urgent Care Course     Procedures (including critical care time)  Labs Review Labs Reviewed - No data to display  Imaging Review No results found.   Visual Acuity Review  Right Eye Distance:   Left Eye Distance:   Bilateral Distance:    Right Eye Near:   Left Eye Near:    Bilateral Near:         MDM   1. Strain of lumbar region, initial encounter   2. Chronic arthralgias of knees and hips   3. Rheumatoid arthritis flare (HCC)    Toradol 60mg  IM Medrol dose pack as directed Flexeril 10mg  one po bid prn #20      Lysbeth Penner, [facility] 04/15/17 2042

## 2017-04-15 NOTE — ED Triage Notes (Signed)
Back, knees and hip have arthritis and pain.   Patient's insurance changed recently and process of getting medicines is lengthened due to this

## 2017-04-19 ENCOUNTER — Telehealth: Payer: Self-pay | Admitting: Family Medicine

## 2017-04-19 ENCOUNTER — Other Ambulatory Visit: Payer: Self-pay

## 2017-04-19 MED ORDER — AMLODIPINE BESYLATE 10 MG PO TABS: 10.0000 mg | ORAL_TABLET | Freq: Every day | ORAL | 3 refills | Status: DC

## 2017-04-19 NOTE — Telephone Encounter (Signed)
Pt called requesting a refill on amlopipine she would like it sent to the [place], [place] - [address] pt can be reached at [phone]

## 2017-04-19 NOTE — Telephone Encounter (Signed)
Done

## 2017-05-14 ENCOUNTER — Ambulatory Visit (HOSPITAL_COMMUNITY): Payer: Managed Care, Other (non HMO) | Admitting: Psychiatry

## 2017-05-31 ENCOUNTER — Emergency Department (HOSPITAL_COMMUNITY)
Admission: EM | Admit: 2017-05-31 | Discharge: 2017-05-31 | Disposition: A | Discharge status: Home / Self Care | Payer: 59 | Attending: Emergency Medicine | Admitting: Emergency Medicine

## 2017-05-31 ENCOUNTER — Encounter (HOSPITAL_COMMUNITY): Payer: Self-pay | Admitting: Emergency Medicine

## 2017-05-31 DIAGNOSIS — Z853 Personal history of malignant neoplasm of breast: Secondary | ICD-10-CM | POA: Insufficient documentation

## 2017-05-31 DIAGNOSIS — S80872A Other superficial bite, left lower leg, initial encounter: Secondary | ICD-10-CM | POA: Diagnosis present

## 2017-05-31 DIAGNOSIS — Y9389 Activity, other specified: Secondary | ICD-10-CM | POA: Diagnosis not present

## 2017-05-31 DIAGNOSIS — W5503XA Scratched by cat, initial encounter: Secondary | ICD-10-CM | POA: Insufficient documentation

## 2017-05-31 DIAGNOSIS — Y998 Other external cause status: Secondary | ICD-10-CM | POA: Insufficient documentation

## 2017-05-31 DIAGNOSIS — T07XXXA Unspecified multiple injuries, initial encounter: Secondary | ICD-10-CM

## 2017-05-31 DIAGNOSIS — Z79899 Other long term (current) drug therapy: Secondary | ICD-10-CM | POA: Insufficient documentation

## 2017-05-31 DIAGNOSIS — Z23 Encounter for immunization: Secondary | ICD-10-CM | POA: Diagnosis not present

## 2017-05-31 DIAGNOSIS — Y929 Unspecified place or not applicable: Secondary | ICD-10-CM | POA: Diagnosis not present

## 2017-05-31 DIAGNOSIS — S80811A Abrasion, right lower leg, initial encounter: Secondary | ICD-10-CM | POA: Diagnosis not present

## 2017-05-31 DIAGNOSIS — I10 Essential (primary) hypertension: Secondary | ICD-10-CM | POA: Insufficient documentation

## 2017-05-31 MED ORDER — AMOXICILLIN-POT CLAVULANATE 875-125 MG PO TABS: 1.0000 | ORAL_TABLET | Freq: Two times a day (BID) | ORAL | 0 refills | Status: DC

## 2017-05-31 MED ORDER — TETANUS-DIPHTH-ACELL PERTUSSIS 5-2.5-18.5 LF-MCG/0.5 IM SUSP
0.5000 mL | Freq: Once | INTRAMUSCULAR | Status: AC
  Administered 2017-05-31: 0.5 mL via INTRAMUSCULAR
  Filled 2017-05-31: qty 0.5

## 2017-05-31 NOTE — ED Provider Notes (Signed)
[place] DEPT Provider Note   CSN: [id number] Arrival date & time: 05/31/17  1830     History   Chief Complaint Chief Complaint  Patient presents with  . Animal Bite    HPI Joanne Garrett is a 41 y.o. female.  The history is provided by the patient. No language interpreter was used.  Animal Bite  Contact animal:  Cat Location:  Leg Leg injury location:  L lower leg and R lower leg Pain details:    Severity:  Moderate   Timing:  Constant Provoked: unprovoked   Animal in possession: no   Tetanus status:  Out of date Relieved by:  Nothing Worsened by:  Nothing Pt complains of scratches on her legs from a neighborhood feral cat.  Pt unsure if she could have been bitten   She reports cat can be found  Past Medical History:  Diagnosis Date  . Breast cancer ([place])    2013; in remission as of 11/13  . Genital herpes   . History of thyroid nodule   . Hypertension   . Kidney stones   . Rheumatoid arthritis [facility])     Patient Active Problem List   Diagnosis Date Noted  . HTN (hypertension) 01/16/2017  . Genital herpes 01/16/2017  . History of thyroid disorder 01/16/2017  . Left thyroid nodule 01/16/2017  . Breast pain, left 01/16/2017  . History of breast cancer 01/16/2017    Past Surgical History:  Procedure Laterality Date  . ABDOMINAL HYSTERECTOMY    . BREAST LUMPECTOMY Left 2013  . left knee surgery    . NECK SURGERY     C5-C6-C7 and has hardware     OB History    No data available       Home Medications    Prior to Admission medications   Medication Sig Start Date End Date Taking? Authorizing Provider  Adalimumab (HUMIRA [place]) Inject 20 mg into the skin every 14 (fourteen) days. Tuesday     [provider]  ALPRAZolam (XANAX) 0.25 MG tablet TAKE 1 TABLET BY MOUTH TWICE DAILY AS NEEDED FOR ANXIETY 03/19/17   Henson, Vickie L, NP-C  amLODipine (NORVASC) 10 MG tablet Take 1 tablet (10 mg total) by mouth daily. 04/19/17   Henson, Vickie L,  NP-C  amoxicillin-clavulanate (AUGMENTIN) 875-125 MG tablet Take 1 tablet by mouth every 12 (twelve) hours. 05/31/17   Fransico Meadow, [name]  cyclobenzaprine (FLEXERIL) 10 MG tablet Take 1 tablet (10 mg total) by mouth 2 (two) times daily as needed for muscle spasms. 04/15/17   Lysbeth Penner, FNP  LORazepam (ATIVAN) 1 MG tablet Take 1 mg by mouth every 8 (eight) hours.    [provider]  methotrexate (50 MG/ML) 1 G injection Inject 40 mg into the vein once a week.     [provider]  methylPREDNISolone (MEDROL DOSEPAK) 4 MG TBPK tablet Take 6-5-4-3-2-1 po qd 04/15/17   Lysbeth Penner, FNP  Multiple Vitamin (MULTIVITAMIN) tablet Take 1 tablet by mouth daily.    [provider]  valACYclovir (VALTREX) 1000 MG tablet Take 1 tablet (1,000 mg total) by mouth 2 (two) times daily. X 5 days during an outbreak 01/16/17   Henson, Vickie L, NP-C  venlafaxine XR (EFFEXOR-XR) 37.5 MG 24 hr capsule TAKE 1 CAPSULE BY MOUTH ONCE DAILY WITH BREAKFAST 03/19/17   Girtha Rm, NP-C    Family History Family History  Problem Relation Age of Onset  . Hypertension Mother   . COPD Mother   .  Hypertension Father   . Kidney disease Father   . Hypertension Brother   . Alzheimer's disease Paternal Grandmother     Social History Social History  Substance Use Topics  . Smoking status: Never Smoker  . Smokeless tobacco: Never Used  . Alcohol use No     Allergies   Shellfish allergy; Bactrim [sulfamethoxazole-trimethoprim]; and Chocolate flavor   Review of Systems Review of Systems  All other systems reviewed and are negative.    Physical Exam Updated Vital Signs BP (!) 152/107 (BP Location: Left Arm)   Pulse 83   Temp 99.1 F (37.3 C) (Oral)   Resp 18   Ht 5\' 3"  (1.6 m)   Wt 90.3 kg (199 lb)   LMP 06/11/2013   SpO2 97%   BMI 35.25 kg/m   Physical Exam  Constitutional: She appears well-developed and well-nourished.  Neurological: She is alert.  Skin:    superficial abrasions bilat lower legs,  No gapping,  Wounds look like scratches   Psychiatric: She has a normal mood and affect.  Nursing note and vitals reviewed.    ED Treatments / Results  Labs (all labs ordered are listed, but only abnormal results are displayed) Labs Reviewed - No data to display  EKG  EKG Interpretation None       Radiology No results found.  Procedures Procedures (including critical care time)  Medications Ordered in ED Medications  Tdap (BOOSTRIX) injection 0.5 mL (0.5 mLs Intramuscular Given 05/31/17 2031)     Initial Impression / Assessment and Plan / ED Course  I have reviewed the triage vital signs and the nursing notes.  Pertinent labs & imaging results that were available during my care of the patient were reviewed by me and considered in my medical decision making (see chart for details).     I will cover pt with augmentin.  Pt advised to watch for infection.  Pt may need rabies vaccine if cat can not be obtasined.  Final Clinical Impressions(s) / ED Diagnoses   Final diagnoses:  Abrasions of multiple sites    New Prescriptions New Prescriptions   AMOXICILLIN-CLAVULANATE (AUGMENTIN) 875-125 MG TABLET    Take 1 tablet by mouth every 12 (twelve) hours.  An After Visit Summary was printed and given to the patient.    Fransico Meadow, [place] 05/31/17 2055    Malvin Johns, MD 05/31/17 2322

## 2017-05-31 NOTE — Discharge Instructions (Signed)
Return if any problems.   You may need rabies vaccines if Animal control is unable to locate the cat.

## 2017-05-31 NOTE — ED Notes (Signed)
ED Provider at bedside. 

## 2017-05-31 NOTE — ED Triage Notes (Signed)
Pt scratched and bitten by a stray cat today. Wounds noted to right foot and left leg.

## 2017-06-02 ENCOUNTER — Encounter (HOSPITAL_COMMUNITY): Payer: Self-pay | Admitting: Emergency Medicine

## 2017-06-02 ENCOUNTER — Emergency Department (HOSPITAL_COMMUNITY)
Admission: EM | Admit: 2017-06-02 | Discharge: 2017-06-02 | Disposition: A | Discharge status: Home / Self Care | Payer: 59 | Attending: Emergency Medicine | Admitting: Emergency Medicine

## 2017-06-02 DIAGNOSIS — Z203 Contact with and (suspected) exposure to rabies: Secondary | ICD-10-CM | POA: Insufficient documentation

## 2017-06-02 DIAGNOSIS — Z853 Personal history of malignant neoplasm of breast: Secondary | ICD-10-CM | POA: Diagnosis not present

## 2017-06-02 DIAGNOSIS — Z79899 Other long term (current) drug therapy: Secondary | ICD-10-CM | POA: Diagnosis not present

## 2017-06-02 DIAGNOSIS — Z23 Encounter for immunization: Secondary | ICD-10-CM

## 2017-06-02 DIAGNOSIS — I10 Essential (primary) hypertension: Secondary | ICD-10-CM | POA: Insufficient documentation

## 2017-06-02 LAB — I-STAT BETA HCG BLOOD, ED (MC, WL, AP ONLY): I-stat hCG, quantitative: <5 m[IU]/mL (ref <5)

## 2017-06-02 MED ORDER — RABIES VACCINE, PCEC IM SUSR
1.0000 mL | Freq: Once | INTRAMUSCULAR | Status: AC
  Administered 2017-06-02: 1 mL via INTRAMUSCULAR
  Filled 2017-06-02: qty 1

## 2017-06-02 MED ORDER — RABIES IMMUNE GLOBULIN 150 UNIT/ML IM INJ
20.0000 [IU]/kg | INJECTION | Freq: Once | INTRAMUSCULAR | Status: AC
  Administered 2017-06-02: 1800 [IU] via INTRAMUSCULAR
  Filled 2017-06-02: qty 12

## 2017-06-02 MED ORDER — IBUPROFEN 400 MG PO TABS
600.0000 mg | ORAL_TABLET | Freq: Once | ORAL | Status: AC
  Administered 2017-06-02: 600 mg via ORAL
  Filled 2017-06-02: qty 1

## 2017-06-02 NOTE — ED Provider Notes (Signed)
[facility] DEPT Provider Note   CSN: [id number] Arrival date & time: 06/02/17  0840  By signing my name below, I, Ephriam Jenkins, attest that this documentation has been prepared under the direction and in the presence of Ocie Cornfield [name].  Electronically Signed: Ephriam Jenkins, ED Scribe. 06/02/17. 10:17 AM.   History   Chief Complaint Chief Complaint  Patient presents with  . Rabies Injection    HPI HPI Comments: Joanne Garrett is a 41 y.o. female who presents to the Emergency Department for first series of Rabies vaccination. Pt was seen here two days ago s/p cat scratches to her left calf and right foot. The cat was a feral cat in her neighborhood. They were unable to catch the cat to monitor it for rabies and presents here today for the first series of Rabies injections. She has had no further complications with the wounds. They are healing well but still mildly painful. She was given tdap booster during her last visit. Pt was given Rx for Augmentin which she has been taking as directed. No fever. No redness, swelling or drainage from the wounds.  The history is provided by the patient. No language interpreter was used.    Past Medical History:  Diagnosis Date  . Breast cancer ([place])    2013; in remission as of 11/13  . Genital herpes   . History of thyroid nodule   . Hypertension   . Kidney stones   . Rheumatoid arthritis [facility])     Patient Active Problem List   Diagnosis Date Noted  . HTN (hypertension) 01/16/2017  . Genital herpes 01/16/2017  . History of thyroid disorder 01/16/2017  . Left thyroid nodule 01/16/2017  . Breast pain, left 01/16/2017  . History of breast cancer 01/16/2017    Past Surgical History:  Procedure Laterality Date  . ABDOMINAL HYSTERECTOMY    . BREAST LUMPECTOMY Left 2013  . left knee surgery    . NECK SURGERY     C5-C6-C7 and has hardware     OB History    No data available       Home Medications    Prior to Admission  medications   Medication Sig Start Date End Date Taking? Authorizing Provider  Adalimumab (HUMIRA [place]) Inject 20 mg into the skin every 14 (fourteen) days. Tuesday     [provider]  ALPRAZolam (XANAX) 0.25 MG tablet TAKE 1 TABLET BY MOUTH TWICE DAILY AS NEEDED FOR ANXIETY 03/19/17   Henson, Vickie L, NP-C  amLODipine (NORVASC) 10 MG tablet Take 1 tablet (10 mg total) by mouth daily. 04/19/17   Henson, Vickie L, NP-C  amoxicillin-clavulanate (AUGMENTIN) 875-125 MG tablet Take 1 tablet by mouth every 12 (twelve) hours. 05/31/17   Fransico Meadow, [name]  cyclobenzaprine (FLEXERIL) 10 MG tablet Take 1 tablet (10 mg total) by mouth 2 (two) times daily as needed for muscle spasms. 04/15/17   Lysbeth Penner, FNP  LORazepam (ATIVAN) 1 MG tablet Take 1 mg by mouth every 8 (eight) hours.    [provider]  methotrexate (50 MG/ML) 1 G injection Inject 40 mg into the vein once a week.     [provider]  methylPREDNISolone (MEDROL DOSEPAK) 4 MG TBPK tablet Take 6-5-4-3-2-1 po qd 04/15/17   Lysbeth Penner, FNP  Multiple Vitamin (MULTIVITAMIN) tablet Take 1 tablet by mouth daily.    [provider]  valACYclovir (VALTREX) 1000 MG tablet Take 1 tablet (1,000 mg total) by mouth 2 (two) times  daily. X 5 days during an outbreak 01/16/17   Henson, Vickie L, NP-C  venlafaxine XR (EFFEXOR-XR) 37.5 MG 24 hr capsule TAKE 1 CAPSULE BY MOUTH ONCE DAILY WITH BREAKFAST 03/19/17   Girtha Rm, NP-C    Family History Family History  Problem Relation Age of Onset  . Hypertension Mother   . COPD Mother   . Hypertension Father   . Kidney disease Father   . Hypertension Brother   . Alzheimer's disease Paternal Grandmother     Social History Social History  Substance Use Topics  . Smoking status: Never Smoker  . Smokeless tobacco: Never Used  . Alcohol use No     Allergies   Shellfish allergy; Bactrim [sulfamethoxazole-trimethoprim]; and Chocolate flavor   Review of  Systems Review of Systems  Constitutional: Negative for chills and fever.  Skin: Positive for wound (healing scratches). Negative for color change.     Physical Exam Updated Vital Signs BP (!) 136/97 (BP Location: Right Arm)   Pulse 74   Temp 98.3 F (36.8 C) (Oral)   Resp 17   Ht 5\' 3"  (1.6 m)   Wt 199 lb (90.3 kg)   LMP 06/11/2013   SpO2 100%   BMI 35.25 kg/m   Physical Exam  Constitutional: She is oriented to person, place, and time. She appears well-developed and well-nourished. No distress.  HENT:  Head: Normocephalic and atraumatic.  Neck: Normal range of motion.  Pulmonary/Chest: Effort normal.  Neurological: She is alert and oriented to person, place, and time.  Skin: Skin is warm and dry. She is not diaphoretic.  Healing scratches to the left distal leg and right foot without any signs of infection. No drainage or erythema.   Psychiatric: She has a normal mood and affect. Judgment normal.  Nursing note and vitals reviewed.    ED Treatments / Results  DIAGNOSTIC STUDIES: Oxygen Saturation is 100% on RA, normal by my interpretation.  COORDINATION OF CARE: 10:12 AM-Discussed treatment plan with pt at bedside and pt agreed to plan.   Labs (all labs ordered are listed, but only abnormal results are displayed) Labs Reviewed - No data to display  EKG  EKG Interpretation None       Radiology No results found.  Procedures Procedures (including critical care time)  Medications Ordered in ED Medications - No data to display   Initial Impression / Assessment and Plan / ED Course  I have reviewed the triage vital signs and the nursing notes.  Pertinent labs & imaging results that were available during my care of the patient were reviewed by me and considered in my medical decision making (see chart for details).     Patient resents to the ED for rabies vaccination. It was scratched by cat 3 days ago. Ambulatory has been unable to find the cat and  recommended patient get the rabies vaccinations. Patient has no complaints. Wounds appear to be healing well without any signs of overlying infection. Patient continue antibiotics. Patient received first dose of vaccinations in the ED will follow up with her primary care doctor urgent care for subsequent doses.  Pt is hemodynamically stable, in NAD, & able to ambulate in the ED. Evaluation does not show pathology that would require ongoing emergent intervention or inpatient treatment. I explained the diagnosis to the patient. Pain has been managed & has no complaints prior to dc. Pt is comfortable with above plan and is stable for discharge at this time. All questions were answered prior to  disposition. Strict return precautions for f/u to the ED were discussed. Encouraged follow up with PCP.   Final Clinical Impressions(s) / ED Diagnoses   Final diagnoses:  Need for rabies vaccination    New Prescriptions New Prescriptions   No medications on file  I personally performed the services described in this documentation, which was scribed in my presence. The recorded information has been reviewed and is accurate.     Doristine Devoid, [name] 06/02/17 [place]    Pattricia Boss, MD 06/08/17 [phone]

## 2017-06-02 NOTE — ED Triage Notes (Signed)
Here for Rabies Injection 1st dose

## 2017-06-02 NOTE — Discharge Instructions (Signed)
Please follow schedule for rabies vaccinations. May go to urgent care or primary care doctor to have your following shots.

## 2017-06-05 ENCOUNTER — Ambulatory Visit (HOSPITAL_COMMUNITY)
Admission: EM | Admit: 2017-06-05 | Discharge: 2017-06-05 | Disposition: A | Discharge status: Home / Self Care | Payer: 59 | Attending: Family Medicine | Admitting: Family Medicine

## 2017-06-05 ENCOUNTER — Encounter (HOSPITAL_COMMUNITY): Payer: Self-pay | Admitting: Emergency Medicine

## 2017-06-05 DIAGNOSIS — W5503XD Scratched by cat, subsequent encounter: Secondary | ICD-10-CM

## 2017-06-05 DIAGNOSIS — Z23 Encounter for immunization: Secondary | ICD-10-CM

## 2017-06-05 DIAGNOSIS — S80812A Abrasion, left lower leg, initial encounter: Secondary | ICD-10-CM

## 2017-06-05 DIAGNOSIS — Z203 Contact with and (suspected) exposure to rabies: Secondary | ICD-10-CM

## 2017-06-05 MED ORDER — RABIES VACCINE, PCEC IM SUSR
1.0000 mL | Freq: Once | INTRAMUSCULAR | Status: AC
  Administered 2017-06-05: 1 mL via INTRAMUSCULAR

## 2017-06-05 MED ORDER — RABIES VACCINE, PCEC IM SUSR: 1.0000 mL | Freq: Once | INTRAMUSCULAR | Status: DC

## 2017-06-05 MED ORDER — RABIES VACCINE, PCEC IM SUSR
INTRAMUSCULAR | Status: AC
  Filled 2017-06-05: qty 1

## 2017-06-05 NOTE — ED Triage Notes (Signed)
PT is here for first rabies follow up from ER.

## 2017-06-05 NOTE — Discharge Instructions (Signed)
Please return to the Urgent Care Center on 07/10/2016 for your Day 7 rabies vaccination injection.

## 2017-06-09 ENCOUNTER — Encounter (HOSPITAL_COMMUNITY): Payer: Self-pay | Admitting: Family Medicine

## 2017-06-09 ENCOUNTER — Ambulatory Visit (HOSPITAL_COMMUNITY)
Admission: EM | Admit: 2017-06-09 | Discharge: 2017-06-09 | Disposition: A | Discharge status: Home / Self Care | Payer: 59 | Attending: Internal Medicine | Admitting: Internal Medicine

## 2017-06-09 DIAGNOSIS — Z203 Contact with and (suspected) exposure to rabies: Secondary | ICD-10-CM

## 2017-06-09 MED ORDER — RABIES VACCINE, PCEC IM SUSR
1.0000 mL | Freq: Once | INTRAMUSCULAR | Status: AC
  Administered 2017-06-09: 1 mL via INTRAMUSCULAR

## 2017-06-09 MED ORDER — RABIES VACCINE, PCEC IM SUSR
INTRAMUSCULAR | Status: AC
  Filled 2017-06-09: qty 1

## 2017-06-09 NOTE — ED Triage Notes (Signed)
Pt here for rabies injection. 

## 2017-06-16 ENCOUNTER — Encounter (HOSPITAL_COMMUNITY): Payer: Self-pay

## 2017-06-16 ENCOUNTER — Ambulatory Visit (HOSPITAL_COMMUNITY)
Admission: EM | Admit: 2017-06-16 | Discharge: 2017-06-16 | Disposition: A | Discharge status: Home / Self Care | Payer: 59 | Attending: Family Medicine | Admitting: Family Medicine

## 2017-06-16 DIAGNOSIS — Z203 Contact with and (suspected) exposure to rabies: Secondary | ICD-10-CM

## 2017-06-16 DIAGNOSIS — Z23 Encounter for immunization: Secondary | ICD-10-CM | POA: Diagnosis not present

## 2017-06-16 MED ORDER — RABIES VACCINE, PCEC IM SUSR
INTRAMUSCULAR | Status: AC
  Filled 2017-06-16: qty 1

## 2017-06-16 MED ORDER — RABIES VACCINE, PCEC IM SUSR
1.0000 mL | Freq: Once | INTRAMUSCULAR | Status: AC
  Administered 2017-06-16: 1 mL via INTRAMUSCULAR

## 2017-06-16 NOTE — ED Triage Notes (Signed)
Pt presents today needing a rabies shot

## 2017-06-16 NOTE — Discharge Instructions (Signed)
Today was the last injection of the rabies booster series. Please return to the Urgent Care with any other issues.

## 2017-07-11 ENCOUNTER — Ambulatory Visit (HOSPITAL_COMMUNITY)
Admission: EM | Admit: 2017-07-11 | Discharge: 2017-07-11 | Disposition: A | Discharge status: Home / Self Care | Payer: 59 | Attending: Internal Medicine | Admitting: Internal Medicine

## 2017-07-11 ENCOUNTER — Encounter (HOSPITAL_COMMUNITY): Payer: Self-pay | Admitting: Nurse Practitioner

## 2017-07-11 DIAGNOSIS — B9789 Other viral agents as the cause of diseases classified elsewhere: Secondary | ICD-10-CM

## 2017-07-11 DIAGNOSIS — J069 Acute upper respiratory infection, unspecified: Secondary | ICD-10-CM

## 2017-07-11 MED ORDER — GUAIFENESIN-CODEINE 100-10 MG/5ML PO SOLN: 5.0000 mL | Freq: Three times a day (TID) | ORAL | 0 refills | Status: DC | PRN

## 2017-07-11 MED ORDER — IPRATROPIUM BROMIDE 0.06 % NA SOLN: 2.0000 | Freq: Four times a day (QID) | NASAL | 0 refills | Status: DC

## 2017-07-11 NOTE — ED Triage Notes (Signed)
Pt presents with c/o cough/cold. Her symptoms began on Monday. She reports headaches, fevers, chills, congestion, sneezing, dry cough. She's tried sudafed, benadryl and mucinex with no improvement

## 2017-07-11 NOTE — ED Provider Notes (Signed)
[facility]   [id number] 07/11/17 Arrival Time: 2683   SUBJECTIVE:  Joanne Garrett is a 41 y.o. female who presents to the urgent care with complaint of cough, congestion, sneezing, runny nose, sore throat for 4 days. She is tried Benadryl, Mucinex, Sudafed with minimal relief. No nausea or vomiting, area, constipation, also denying ear pain or pressure. 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 Breast cancer ([place])    2013; in remission as of 11/13  . Genital herpes   . History of thyroid nodule   . Hypertension   . Kidney stones   . Rheumatoid arthritis ([place])    Family History  Problem Relation Age of Onset  . Hypertension Mother   . COPD Mother   . Hypertension Father   . Kidney disease Father   . Hypertension Brother   . Alzheimer's disease Paternal Grandmother    Social History   Social History  . Marital status: Single    Spouse name: N/A  . Number of children: N/A  . Years of education: N/A   Occupational History  . Not on file.   Social History Main Topics  . Smoking status: Never Smoker  . Smokeless tobacco: Never Used  . Alcohol use No  . Drug use: No  . Sexual activity: Not on file   Other Topics Concern  . Not on file   Social History Narrative  . No narrative on file   No outpatient prescriptions have been marked as taking for the 07/11/17 encounter [facility] Encounter).   Allergies  Allergen Reactions  . Shellfish Allergy Itching and Other (See Comments)    HIVES IF GOES OVERBOARD, HAS HAD IV DYE AND HAD NO PROBLEMS  . Bactrim [Sulfamethoxazole-Trimethoprim] Hives and Other (See Comments)  . Chocolate Flavor Itching and Other (See Comments)    PLAIN CHOCOLATE IN ABUNDANCE PLAIN CHOCOLATE IN ABUNDANCE      ROS: As per HPI, remainder of ROS negative.   OBJECTIVE:   Vitals:   07/11/17 1445  BP: 124/88  Pulse: 86  Resp: 17  Temp: 98.4 F (36.9 C)  TempSrc: Oral  SpO2: 97%     General appearance: alert; no distress Eyes: PERRL; EOMI; conjunctiva normal HENT: normocephalic; atraumatic; TMs normal, canal normal, external ears normal without trauma; nasal mucosa normal; oral mucosa normal Neck: supple, No cervical lymphadenopathy Lungs: clear to auscultation bilaterally Heart: regular rate and rhythm Abdomen: soft, non-tender; bowel sounds normal; no masses or organomegaly; no guarding or rebound tenderness Back: no CVA tenderness Extremities: no cyanosis or edema; symmetrical with no gross deformities Skin: warm and dry Neurologic: normal gait; grossly normal Psychological: alert and cooperative; normal mood and affect      Labs: Labs Reviewed - No data to display  No results found.     ASSESSMENT & PLAN:  1. Viral URI with cough     Meds ordered this encounter  Medications  . guaiFENesin-codeine 100-10 MG/5ML syrup    Sig: Take 5 mLs by mouth 3 (three) times daily as needed for cough.    Dispense:  120 mL    Refill:  0    Order Specific Question:   Supervising Provider    Answer:   Sherlene Shams [[id number]]  . ipratropium (ATROVENT) 0.06 % nasal spray    Sig: Place 2 sprays  into both nostrils 4 (four) times daily.    Dispense:  15 mL    Refill:  0    Order Specific Question:   Supervising Provider    Answer:   Sherlene Shams [[id number]]   Provided counseling on over-the-counter therapies for symptom management, given work note, follow-up in one week as needed Reviewed expectations re: course of current medical issues. Questions answered. Outlined signs and symptoms indicating need for more acute intervention. Patient verbalized understanding. After Visit Summary given.    Procedures:        Barnet Glasgow, NP 07/11/17 [phone]

## 2017-07-11 NOTE — Discharge Instructions (Signed)
You most likely have a viral respiratory infection, this type of infection will not be helped by antibiotics.  For your symptoms I have prescribed  ipratropium nasal spray, do 2 sprays each nostril up to 4 times a day.  I have also prescribed a medicine for cough called Cheritussin, this medicine is a narcotic, it will cause drowsiness, and it is addictive. Do not take more than what is necessary, do not drink alcohol while taking, and do not operate any heavy machinery while taking this medicine. .   In addition to these therapies, I advise rest, plenty of fluids and management of symptoms with over the counter medicines. Over the counter therapies for your symptoms include:Tylenol as needed every 4-6 hours for body aches or fever, not to exceed 4,000 mg a day, Take mucinex or mucinex DM ever 12 hours with a full glass of water, you may use an inhaled steroid such as Flonase, 2 sprays each nostril once a day for congestion, or an antihistamine such as Claritin or Zyrtec once a day. Another alternative for congestion, is a pseudoephedrine containing product available from the pharmacist. Should your symptoms worsen or fail to resolve, follow up with your primary care provider or return to clinic.

## 2017-07-12 ENCOUNTER — Other Ambulatory Visit: Payer: Self-pay | Admitting: Family Medicine

## 2017-07-12 DIAGNOSIS — F410 Panic disorder [episodic paroxysmal anxiety] without agoraphobia: Secondary | ICD-10-CM

## 2017-07-12 DIAGNOSIS — F419 Anxiety disorder, unspecified: Secondary | ICD-10-CM

## 2017-07-12 NOTE — Telephone Encounter (Signed)
Left message for pt to call me back,  Last OV notes states she is suppose to go to counseling, wanted to find out if she has done that.

## 2017-07-12 NOTE — Telephone Encounter (Signed)
This med was already called in to pharmacy today

## 2017-07-12 NOTE — Telephone Encounter (Signed)
ok 

## 2017-07-12 NOTE — Telephone Encounter (Signed)
Pt is seeing a psych but did not know she needed them to take over her meds too. I advised that we could possibly refill for 30 days only and then she set up an appt to discuss this with her psych doctor. Is this okay to refill for 30 days

## 2017-07-12 NOTE — Telephone Encounter (Signed)
Called in med to pharmacy  

## 2017-07-22 ENCOUNTER — Telehealth: Payer: Self-pay | Admitting: Family Medicine

## 2017-07-22 NOTE — Telephone Encounter (Signed)
Pt states has been seeing therapist Dr. Fransico Michael but they are not able to do anything with her meds.  She will have to make appt with a Psychiatrist is what they have told her so she find one & make appt. She will let us know if she can't get in before her meds run out.

## 2017-07-24 NOTE — Telephone Encounter (Signed)
That is fine 

## 2017-08-14 ENCOUNTER — Other Ambulatory Visit: Payer: Self-pay | Admitting: Orthopedic Surgery

## 2017-08-14 DIAGNOSIS — M4722 Other spondylosis with radiculopathy, cervical region: Secondary | ICD-10-CM

## 2017-08-20 ENCOUNTER — Ambulatory Visit (INDEPENDENT_AMBULATORY_CARE_PROVIDER_SITE_OTHER): Payer: 59 | Admitting: Family Medicine

## 2017-08-20 ENCOUNTER — Encounter: Payer: Self-pay | Admitting: Family Medicine

## 2017-08-20 VITALS — BP 120/74 | HR 75 | Temp 98.3°F | Wt 199.0 lb

## 2017-08-20 DIAGNOSIS — Z87442 Personal history of urinary calculi: Secondary | ICD-10-CM | POA: Diagnosis not present

## 2017-08-20 DIAGNOSIS — R310 Gross hematuria: Secondary | ICD-10-CM | POA: Diagnosis not present

## 2017-08-20 DIAGNOSIS — N3941 Urge incontinence: Secondary | ICD-10-CM

## 2017-08-20 DIAGNOSIS — R350 Frequency of micturition: Secondary | ICD-10-CM

## 2017-08-20 DIAGNOSIS — R109 Unspecified abdominal pain: Secondary | ICD-10-CM

## 2017-08-20 DIAGNOSIS — R110 Nausea: Secondary | ICD-10-CM | POA: Diagnosis not present

## 2017-08-20 LAB — POCT URINALYSIS DIP (PROADVANTAGE DEVICE)
Bilirubin, UA: NEGATIVE
Glucose, UA: NEGATIVE mg/dL
Ketones, POC UA: NEGATIVE mg/dL
Leukocytes, UA: NEGATIVE
Nitrite, UA: NEGATIVE
Specific Gravity, Urine: 1.02
Urobilinogen, Ur: NEGATIVE
pH, UA: 6 (ref 5.0–8.0)

## 2017-08-20 MED ORDER — ONDANSETRON 4 MG PO TBDP: 4.0000 mg | ORAL_TABLET | Freq: Three times a day (TID) | ORAL | 0 refills | Status: DC | PRN

## 2017-08-20 NOTE — Progress Notes (Signed)
   Subjective:    Patient ID: Joanne Garrett, female    DOB: [birth year], 41 y.o.   MRN: [id number]  HPI Chief Complaint  Patient presents with  . possible kidney stone    passed kidney stone friday and thinks she is passing another one, burning, nausated, back pain and incontinence   She is here with complaints of having feeling like she is passing a kidney stone. States she thinks she passed one yesterday also. Complains of right low back pain, urinary frequency, hematuria, and nausea.  States flank pain yesterday was much worse. Rates her pain currently at a 1/10.  Reports episodes of urinary urgency and incontinence yesterday and today.   States prior to yesterday she had not had any issues with kidney stones for several months.   States she takes Advil as needed for pain.    Denies fever, chills, dizziness, chest pain, palpitations, shortness of breath, abdominal pain, N/V/D, urinary symptoms, vaginal discharge, LE edema.   Hysterectomy in the past.   States she does not have a urologist. States she saw a urologist 3 years ago in [place]. Would like to establish with urology in [place].   Reviewed allergies, medications, past medical, surgical, and social history.   Review of Systems Pertinent positives and negatives in the history of present illness.     Objective:   Physical Exam BP 120/74   Pulse 75   Temp 98.3 F (36.8 C) (Oral)   Wt 199 lb (90.3 kg)   LMP 06/11/2013   BMI 35.25 kg/m  Alert and in no distress. Marland Kitchen Pharyngeal area is normal. Neck is supple without adenopathy or thyromegaly. Cardiac exam shows a regular sinus rhythm without murmurs or gallops. Lungs are clear to auscultation. Abdomen is soft, non distended, non tender, normal BS, no masses, no CVAT. Extremities without edema, normal pulses. GU exam declined.       Assessment & Plan:  Urinary frequency - Plan: POCT Urinalysis DIP (Proadvantage Device), Ambulatory referral to Urology  Right  flank pain - Plan: POCT Urinalysis DIP (Proadvantage Device), Ambulatory referral to Urology  History of kidney stones - Plan: POCT Urinalysis DIP (Proadvantage Device), Ambulatory referral to Urology  Nausea  Urgency incontinence - Plan: Ambulatory referral to Urology  Gross hematuria - Plan: Ambulatory referral to Urology  UA shows a large amount of blood, negative otherwise.  Reviewed CTs from earlier this year and she has history of multiple small bilateral non obstructing renal calculi.  She is not in any distress. Pain is well controlled on Advil.  Zofran prescribed for nausea. Plan to refer her to urology for further evaluation of multiple kidney stones.  She is aware that if she develops fever, urinary retention or severe pain that she will call or go to the ED.

## 2017-08-24 ENCOUNTER — Ambulatory Visit
Admission: RE | Admit: 2017-08-24 | Discharge: 2017-08-24 | Disposition: A | Discharge status: Home / Self Care | Payer: 59 | Source: Ambulatory Visit | Attending: Orthopedic Surgery | Admitting: Orthopedic Surgery

## 2017-08-24 DIAGNOSIS — M4722 Other spondylosis with radiculopathy, cervical region: Secondary | ICD-10-CM

## 2017-08-29 ENCOUNTER — Other Ambulatory Visit: Payer: Self-pay | Admitting: Family Medicine

## 2017-08-29 DIAGNOSIS — F419 Anxiety disorder, unspecified: Secondary | ICD-10-CM

## 2017-08-29 DIAGNOSIS — F410 Panic disorder [episodic paroxysmal anxiety] without agoraphobia: Secondary | ICD-10-CM

## 2017-08-29 NOTE — Telephone Encounter (Signed)
Is this okay to refill? 

## 2017-08-29 NOTE — Telephone Encounter (Signed)
Ok to refill Effexor for 3 months and Xanax #12 with no refills. I see she has an appt with [facility] in November. Thanks.

## 2017-08-29 NOTE — Telephone Encounter (Signed)
Called in med to [facility]

## 2017-08-30 ENCOUNTER — Telehealth: Payer: Self-pay | Admitting: Family Medicine

## 2017-08-30 NOTE — Telephone Encounter (Signed)
Patient called, she saw urologist yesterday, Dr. Gloriann Loan,  At [facility] they worked her in She is going to have the kidney stone removed, she is just waiting for an appointment

## 2017-09-03 ENCOUNTER — Other Ambulatory Visit: Payer: Self-pay | Admitting: Urology

## 2017-09-04 ENCOUNTER — Encounter (HOSPITAL_BASED_OUTPATIENT_CLINIC_OR_DEPARTMENT_OTHER): Payer: Self-pay | Admitting: *Deleted

## 2017-09-04 NOTE — Progress Notes (Signed)
NPO AFTER MN W/ EXCEPTION CLEAR LIQUIDS UNTIL 0730 (NO CREAM / MILK PRODUCTS).  ARRIVE AT 1145.  NEEDS ISTAT AND EKG.  WILL TAKE AM MEDS W/ SIPS OF WATER AND IF NEEDED TAKE VICODIN.

## 2017-09-09 ENCOUNTER — Other Ambulatory Visit: Payer: Self-pay

## 2017-09-09 ENCOUNTER — Ambulatory Visit (HOSPITAL_BASED_OUTPATIENT_CLINIC_OR_DEPARTMENT_OTHER): Payer: 59 | Admitting: Anesthesiology

## 2017-09-09 ENCOUNTER — Ambulatory Visit (HOSPITAL_BASED_OUTPATIENT_CLINIC_OR_DEPARTMENT_OTHER)
Admission: RE | Admit: 2017-09-09 | Discharge: 2017-09-09 | Disposition: A | Discharge status: Home / Self Care | Payer: 59 | Source: Ambulatory Visit | Attending: Urology | Admitting: Urology

## 2017-09-09 ENCOUNTER — Encounter (HOSPITAL_BASED_OUTPATIENT_CLINIC_OR_DEPARTMENT_OTHER)
Admission: RE | Disposition: A | Discharge status: Home / Self Care | Payer: Self-pay | Source: Ambulatory Visit | Attending: Urology

## 2017-09-09 ENCOUNTER — Encounter (HOSPITAL_BASED_OUTPATIENT_CLINIC_OR_DEPARTMENT_OTHER): Payer: Self-pay | Admitting: *Deleted

## 2017-09-09 DIAGNOSIS — Z6835 Body mass index (BMI) 35.0-35.9, adult: Secondary | ICD-10-CM | POA: Insufficient documentation

## 2017-09-09 DIAGNOSIS — F329 Major depressive disorder, single episode, unspecified: Secondary | ICD-10-CM | POA: Diagnosis not present

## 2017-09-09 DIAGNOSIS — E039 Hypothyroidism, unspecified: Secondary | ICD-10-CM | POA: Diagnosis not present

## 2017-09-09 DIAGNOSIS — Z79899 Other long term (current) drug therapy: Secondary | ICD-10-CM | POA: Insufficient documentation

## 2017-09-09 DIAGNOSIS — N200 Calculus of kidney: Secondary | ICD-10-CM | POA: Diagnosis not present

## 2017-09-09 DIAGNOSIS — Z87442 Personal history of urinary calculi: Secondary | ICD-10-CM | POA: Insufficient documentation

## 2017-09-09 DIAGNOSIS — I10 Essential (primary) hypertension: Secondary | ICD-10-CM | POA: Insufficient documentation

## 2017-09-09 DIAGNOSIS — F419 Anxiety disorder, unspecified: Secondary | ICD-10-CM | POA: Insufficient documentation

## 2017-09-09 DIAGNOSIS — Z853 Personal history of malignant neoplasm of breast: Secondary | ICD-10-CM | POA: Insufficient documentation

## 2017-09-09 HISTORY — DX: Personal history of irradiation: Z92.3

## 2017-09-09 HISTORY — DX: Calculus of kidney: N20.0

## 2017-09-09 HISTORY — DX: Personal history of other mental and behavioral disorders: Z86.59

## 2017-09-09 HISTORY — DX: Urgency of urination: R39.15

## 2017-09-09 HISTORY — DX: Personal history of other endocrine, nutritional and metabolic disease: Z86.39

## 2017-09-09 HISTORY — DX: Hematuria, unspecified: R31.9

## 2017-09-09 HISTORY — DX: Depression, unspecified: F32.A

## 2017-09-09 HISTORY — DX: Anxiety disorder, unspecified: F41.9

## 2017-09-09 HISTORY — PX: CYSTOSCOPY WITH RETROGRADE PYELOGRAM, URETEROSCOPY AND STENT PLACEMENT: SHX5789

## 2017-09-09 HISTORY — DX: Major depressive disorder, single episode, unspecified: F32.9

## 2017-09-09 LAB — POCT I-STAT, CHEM 8
BUN: 11 mg/dL (ref 6–20)
Calcium, Ion: 1.09 mmol/L — ABNORMAL LOW (ref 1.15–1.40)
Chloride: 103 mmol/L (ref 101–111)
Creatinine, Ser: 0.5 mg/dL (ref 0.44–1.00)
Glucose, Bld: 74 mg/dL (ref 65–99)
HCT: 43 % (ref 36.0–46.0)
Hemoglobin: 14.6 g/dL (ref 12.0–15.0)
Potassium: 2.9 mmol/L — ABNORMAL LOW (ref 3.5–5.1)
Sodium: 142 mmol/L (ref 135–145)
TCO2: 28 mmol/L (ref 22–32)

## 2017-09-09 SURGERY — CYSTOURETEROSCOPY, WITH RETROGRADE PYELOGRAM AND STENT INSERTION
Anesthesia: General | Site: Renal | Laterality: Right

## 2017-09-09 MED ORDER — METOCLOPRAMIDE HCL 5 MG/ML IJ SOLN
INTRAMUSCULAR | Status: AC
  Filled 2017-09-09: qty 2

## 2017-09-09 MED ORDER — KETOROLAC TROMETHAMINE 30 MG/ML IJ SOLN
INTRAMUSCULAR | Status: AC
  Filled 2017-09-09: qty 1

## 2017-09-09 MED ORDER — KETOROLAC TROMETHAMINE 30 MG/ML IJ SOLN
INTRAMUSCULAR | Status: DC | PRN
  Administered 2017-09-09: 30 mg via INTRAVENOUS

## 2017-09-09 MED ORDER — SODIUM CHLORIDE 0.9 % IR SOLN
Status: DC | PRN
  Administered 2017-09-09: 1000 mL via INTRAVESICAL

## 2017-09-09 MED ORDER — MIDAZOLAM HCL 5 MG/5ML IJ SOLN
INTRAMUSCULAR | Status: DC | PRN
  Administered 2017-09-09: 2 mg via INTRAVENOUS

## 2017-09-09 MED ORDER — DEXAMETHASONE SODIUM PHOSPHATE 10 MG/ML IJ SOLN
INTRAMUSCULAR | Status: AC
  Filled 2017-09-09: qty 1

## 2017-09-09 MED ORDER — PROPOFOL 10 MG/ML IV BOLUS
INTRAVENOUS | Status: AC
  Filled 2017-09-09: qty 20

## 2017-09-09 MED ORDER — LACTATED RINGERS IV SOLN
INTRAVENOUS | Status: DC
  Administered 2017-09-09: 13:00:00 via INTRAVENOUS
  Filled 2017-09-09: qty 1000

## 2017-09-09 MED ORDER — PROPOFOL 10 MG/ML IV BOLUS
INTRAVENOUS | Status: DC | PRN
  Administered 2017-09-09: 200 mg via INTRAVENOUS

## 2017-09-09 MED ORDER — CEFAZOLIN SODIUM-DEXTROSE 2-4 GM/100ML-% IV SOLN
INTRAVENOUS | Status: AC
  Filled 2017-09-09: qty 100

## 2017-09-09 MED ORDER — LIDOCAINE 2% (20 MG/ML) 5 ML SYRINGE
INTRAMUSCULAR | Status: DC | PRN
  Administered 2017-09-09: 100 mg via INTRAVENOUS

## 2017-09-09 MED ORDER — FENTANYL CITRATE (PF) 100 MCG/2ML IJ SOLN
INTRAMUSCULAR | Status: AC
  Filled 2017-09-09: qty 2

## 2017-09-09 MED ORDER — MIDAZOLAM HCL 2 MG/2ML IJ SOLN
INTRAMUSCULAR | Status: AC
  Filled 2017-09-09: qty 2

## 2017-09-09 MED ORDER — LIDOCAINE 2% (20 MG/ML) 5 ML SYRINGE
INTRAMUSCULAR | Status: AC
  Filled 2017-09-09: qty 5

## 2017-09-09 MED ORDER — IOHEXOL 300 MG/ML  SOLN
INTRAMUSCULAR | Status: DC | PRN
  Administered 2017-09-09: 10 mL

## 2017-09-09 MED ORDER — ONDANSETRON HCL 4 MG/2ML IJ SOLN
INTRAMUSCULAR | Status: AC
  Filled 2017-09-09: qty 2

## 2017-09-09 MED ORDER — ONDANSETRON HCL 4 MG/2ML IJ SOLN
INTRAMUSCULAR | Status: DC | PRN
  Administered 2017-09-09: 4 mg via INTRAVENOUS

## 2017-09-09 MED ORDER — FENTANYL CITRATE (PF) 100 MCG/2ML IJ SOLN
INTRAMUSCULAR | Status: DC | PRN
  Administered 2017-09-09 (×2): 50 ug via INTRAVENOUS

## 2017-09-09 MED ORDER — METOCLOPRAMIDE HCL 5 MG/ML IJ SOLN
INTRAMUSCULAR | Status: DC | PRN
  Administered 2017-09-09: 5 mg via INTRAVENOUS

## 2017-09-09 MED ORDER — DEXAMETHASONE SODIUM PHOSPHATE 4 MG/ML IJ SOLN
INTRAMUSCULAR | Status: DC | PRN
  Administered 2017-09-09: 10 mg via INTRAVENOUS

## 2017-09-09 MED ORDER — CEFAZOLIN SODIUM-DEXTROSE 2-4 GM/100ML-% IV SOLN
2.0000 g | INTRAVENOUS | Status: AC
  Administered 2017-09-09: 2 g via INTRAVENOUS
  Filled 2017-09-09: qty 100

## 2017-09-09 SURGICAL SUPPLY — 19 items
BAG DRAIN URO-CYSTO SKYTR STRL (DRAIN) ×3 IMPLANT
CATH INTERMIT  6FR 70CM (CATHETERS) ×3 IMPLANT
CLOTH BEACON ORANGE TIMEOUT ST (SAFETY) ×3 IMPLANT
EXTRACTOR STONE 1.7FRX115CM (UROLOGICAL SUPPLIES) ×3 IMPLANT
FIBER LASER FLEXIVA 365 (UROLOGICAL SUPPLIES) IMPLANT
FIBER LASER TRAC TIP (UROLOGICAL SUPPLIES) IMPLANT
GLOVE BIO SURGEON STRL SZ7.5 (GLOVE) ×3 IMPLANT
GOWN STRL REUS W/TWL XL LVL3 (GOWN DISPOSABLE) ×3 IMPLANT
GUIDEWIRE STR DUAL SENSOR (WIRE) ×6 IMPLANT
INFUSOR MANOMETER BAG 3000ML (MISCELLANEOUS) ×3 IMPLANT
IV NS 1000ML (IV SOLUTION) ×1
IV NS 1000ML BAXH (IV SOLUTION) ×2 IMPLANT
IV NS IRRIG 3000ML ARTHROMATIC (IV SOLUTION) ×3 IMPLANT
KIT RM TURNOVER CYSTO AR (KITS) ×3 IMPLANT
MANIFOLD NEPTUNE II (INSTRUMENTS) ×3 IMPLANT
PACK CYSTO (CUSTOM PROCEDURE TRAY) ×3 IMPLANT
SHEATH URET ACCESS 12FR/35CM (UROLOGICAL SUPPLIES) ×3 IMPLANT
STENT URET 6FRX24 CONTOUR (STENTS) ×3 IMPLANT
TUBE CONNECTING 12X1/4 (SUCTIONS) ×3 IMPLANT

## 2017-09-09 NOTE — Op Note (Signed)
Operative Note  Preoperative diagnosis:  1.  Right renal calculus  Postoperative diagnosis: 1.  Right renal calculus  Procedure(s): 1.  Cystoscopy with right retrograde pyelogram, right ureteroscopy with stone basketing, ureteral stent placement  Surgeon: Link Snuffer, MD  Assistants: None  Anesthesia: General  Complications: None immediate  EBL: Minimal  Specimens: 1.  Renal calculus  Drains/Catheters: 1.  6 x 24 double-J ureteral stent  Intraoperative findings: 1.  Normal bladder and urethra 2.  No ureteral calculi 3.  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 4.  Retrograde pyelogram revealed no significant filling defects and no hydronephrosis.  Indication: 41 year old female with a history of bilateral renal calculi.  She was having severe right-sided flank pain and associated hematuria with continued pain.  The patient was presented the option of repeat CT scan versus proceeding with ureteroscopy and she elected to undergo the above operation.  The patient was identified and consent was obtained.  Description of procedure:  She was taken to the operating room and placed in the supine position.  She was placed under general anesthesia.  She was converted to dorsal lithotomy.  She was prepped and draped in standard sterile fashion and a timeout was performed.  Preoperative antibiotics were administered.  I first advanced to the 23 [place] rigid cystoscope into the bladder and performed a complete cystoscopy with no abnormal findings.  The right ureter was cannulated with a open-ended ureteral catheter and retrograde pyelogram was performed.  A sensor wire was then advanced up to the kidney under fluoroscopic guidance.  A semirigid ureteroscope was advanced alongside the wire up to the renal pelvis with no abnormality seen.  A second wire was advanced through the scope.  The inner portion of a 12 x 14 access sheath was  used to dilate over 1 of the wires followed by placement of a entire 12 x 14 access sheath.  Flexible ureteroscopy was performed which revealed the previously mentioned findings.  There was one stone that was able to be basket extracted but there were no other clinically significant renal calculi seen.  There were only multiple Randall's plaques.  The stone was collected and the scope along with the access sheath were withdrawn.  There is mild trauma to the distal right ureter secondary to sheath placement but not a significant injury.  I then placed a 6 x 24 double-J ureteral stent over the wire under fluoroscopic guidance followed by removal of the wire.  I left a string in place.  I drained the bladder and this concluded the operation patient tolerated procedure well and was stable postoperatively.  Plan: Patient will remove her stent in 1 week and come back in about 4 weeks for a renal ultrasound.

## 2017-09-09 NOTE — Anesthesia Preprocedure Evaluation (Signed)
Anesthesia Evaluation  Patient identified by MRN, date of birth, ID band Patient awake    Reviewed: Allergy & Precautions, NPO status , Patient's Chart, lab work & pertinent test results  History of Anesthesia Complications Negative for: history of anesthetic complications  Airway Mallampati: II  TM Distance: >3 FB Neck ROM: Full    Dental no notable dental hx. (+) Dental Advisory Given   Pulmonary neg pulmonary ROS,    Pulmonary exam normal        Cardiovascular hypertension, Pt. on medications negative cardio ROS Normal cardiovascular exam     Neuro/Psych PSYCHIATRIC DISORDERS Anxiety Depression negative neurological ROS     GI/Hepatic negative GI ROS, Neg liver ROS,   Endo/Other  Morbid obesity  Renal/GU   negative genitourinary   Musculoskeletal negative musculoskeletal ROS (+)   Abdominal   Peds negative pediatric ROS (+)  Hematology negative hematology ROS (+)   Anesthesia Other Findings   Reproductive/Obstetrics negative OB ROS                             Anesthesia Physical Anesthesia Plan  ASA: II  Anesthesia Plan:    Post-op Pain Management:    Induction:   PONV Risk Score and Plan: 3 and Ondansetron, Dexamethasone, Treatment may vary due to age or medical condition and Scopolamine patch - Pre-op  Airway Management Planned: LMA  Additional Equipment:   Intra-op Plan:   Post-operative Plan: Extubation in OR  Informed Consent: I have reviewed the patients History and Physical, chart, labs and discussed the procedure including the risks, benefits and alternatives for the proposed anesthesia with the patient or authorized representative who has indicated his/her understanding and acceptance.   Dental advisory given  Plan Discussed with: CRNA and Anesthesiologist  Anesthesia Plan Comments:         Anesthesia Quick Evaluation

## 2017-09-09 NOTE — Anesthesia Procedure Notes (Signed)
Procedure Name: LMA Insertion Date/Time: 09/09/2017 1:25 PM Performed by: Duane Boston, MD Pre-anesthesia Checklist: Patient identified, Emergency Drugs available, Suction available and Patient being monitored Patient Re-evaluated:Patient Re-evaluated prior to induction Oxygen Delivery Method: Circle system utilized Preoxygenation: Pre-oxygenation with 100% oxygen Induction Type: IV induction Ventilation: Mask ventilation without difficulty LMA: LMA inserted LMA Size: 4.0 Number of attempts: 1 Airway Equipment and Method: Bite block Placement Confirmation: positive ETCO2 Tube secured with: Tape Dental Injury: Teeth and Oropharynx as per pre-operative assessment

## 2017-09-09 NOTE — Interval H&P Note (Signed)
History and Physical Interval Note:  09/09/2017 12:11 PM  Joanne Garrett  has presented today for surgery, with the diagnosis of RIGHT RENAL STONE  The various methods of treatment have been discussed with the patient and family. After consideration of risks, benefits and other options for treatment, the patient has consented to  Procedure(s): CYSTOSCOPY WITH RIGHT RETROGRADE PYELOGRAM, URETEROSCOPY LASER LITHOTRIPSY AND STENT PLACEMENT (Right) HOLMIUM LASER APPLICATION (Right) as a surgical intervention .  The patient's history has been reviewed, patient examined, no change in status, stable for surgery.  I have reviewed the patient's chart and labs.  Questions were answered to the patient's satisfaction.     Marton Redwood, III

## 2017-09-09 NOTE — Transfer of Care (Signed)
  Last Vitals:  Vitals:   09/09/17 1126 09/09/17 1412  BP: (!) 151/84 (!) 135/97  Pulse: 80   Resp: 18   Temp: 36.8 C 36.8 C  SpO2: 99%     Last Pain:  Vitals:   09/09/17 1126  TempSrc: Oral      Patients Stated Pain Goal: 7 (09/09/17 1158) Immediate Anesthesia Transfer of Care Note  Patient: Joanne Garrett  Procedure(s) Performed: Procedure(s) (LRB): CYSTOSCOPY WITH RIGHT RETROGRADE PYELOGRAM, URETEROSCOPY , STONE BASKETRY AND STENT PLACEMENT (Right)  Patient Location: PACU  Anesthesia Type: General  Level of Consciousness: awake, alert  and oriented  Airway & Oxygen Therapy: Patient Spontanous Breathing and Patient connected to nasal cannula oxygen  Post-op Assessment: Report given to PACU RN and Post -op Vital signs reviewed and stable  Post vital signs: Reviewed and stable  Complications: No apparent anesthesia complications

## 2017-09-09 NOTE — H&P (Signed)
CC: I have kidney stones.  HPI: Joanne Garrett is a 41 year-old female patient who was referred by Dr. Colonel Bald, MD who is here for renal calculi.  The problem is on both sides. She first stated noticing pain on approximately 08/15/2017. This is not her first kidney stone. She has had 3 stones prior to getting this one. She is currently having flank pain, nausea, and vomiting. She denies having back pain, groin pain, fever, and chills. She has not caught a stone in her urine strainer since her symptoms began.   She has never had surgical treatment for calculi in the past.   The patient has a history of bilateral renal calculi. Her last CT scan was in February. For the past 2 weeks she has had severe right-sided flank pain and associated hematuria. She passed one stone but continues to have pain on that right side. Review of her prior CT scan showed bilateral renal calculi, several on each side. She has passed stones in the past on her own and has not required intervention before.     ALLERGIES: Bactrim - Hives, Itching    MEDICATIONS: Amlodipine Besylate 10 mg tablet  Effexor Xr 37.5 mg capsule, ext release 24 hr  Ibuprofen 200 mg tablet  Methotrexate  Xanax 0.25 mg tablet     GU PSH: Hysterectomy    NON-GU PSH: Breast lumpectomy, Left Knee Arthroscopy, Left Neck Spinal Fusion    GU PMH: None   NON-GU PMH: Anxiety Arthritis Breast Cancer, History Depression Encounter for general adult medical examination without abnormal findings, Encounter for preventive health examination Hypertension Hypothyroidism    FAMILY HISTORY: copd - Mother Death of family member - Father, Mother kidney disease - Father renal failure - Father   SOCIAL HISTORY: Marital Status: Single Preferred Language: English; Ethnicity: Not Hispanic Or Latino; Race: Black or African American Current Smoking Status: Patient has never smoked.   Tobacco Use Assessment Completed: Used Tobacco  in last 30 days? Does not use smokeless tobacco. Has never drank.  Does not drink caffeine.    REVIEW OF SYSTEMS:    GU Review Female:   Patient reports frequent urination, hard to postpone urination, and get up at night to urinate. Patient denies burning /pain with urination, leakage of urine, stream starts and stops, trouble starting your stream, have to strain to urinate, and being pregnant.  Gastrointestinal (Upper):   Patient reports nausea and vomiting. Patient denies indigestion/ heartburn.  Gastrointestinal (Lower):   Patient denies diarrhea and constipation.  Constitutional:   Patient reports fever. Patient denies night sweats, weight loss, and fatigue.  Skin:   Patient denies skin rash/ lesion and itching.  Eyes:   Patient denies blurred vision and double vision.  Ears/ Nose/ Throat:   Patient denies sore throat and sinus problems.  Hematologic/Lymphatic:   Patient denies swollen glands and easy bruising.  Cardiovascular:   Patient denies leg swelling and chest pains.  Respiratory:   Patient denies cough and shortness of breath.  Endocrine:   Patient denies excessive thirst.  Musculoskeletal:   Patient reports back pain and joint pain.   Neurological:   Patient denies headaches and dizziness.  Psychologic:   Patient reports depression and anxiety.    VITAL SIGNS:      08/29/2017 02:23 PM  Weight 199 lb / 90.26 kg  Height 63 in / 160.02 cm  BP 161/100 mmHg  Pulse 78 /min  Temperature 98.6 F / 37 C  BMI 35.2 kg/m  MULTI-SYSTEM PHYSICAL EXAMINATION:    Constitutional: Well-nourished. No physical deformities. Normally developed. Good grooming.  Respiratory: No labored breathing, no use of accessory muscles.   Cardiovascular: Normal temperature, adequate peripheral perfusion  Skin: No paleness, no jaundice,  Neurologic / Psychiatric: Oriented to time, oriented to place, oriented to person. No depression, no anxiety, no agitation.  Gastrointestinal: No mass, no tenderness,  no rigidity, non obese abdomen. No CVA tenderness bilaterally  Eyes: Normal conjunctivae. Normal eyelids.  Musculoskeletal: Normal gait and station of head and neck.     PAST DATA REVIEWED:  Source Of History:  Patient  X-Ray Review: C.T. Abdomen/Pelvis: Reviewed Films. Reviewed Report. Discussed With Patient.     PROCEDURES:          Urinalysis w/Scope Dipstick Dipstick Cont'd Micro  Color: Yellow Bilirubin: Neg WBC/hpf: NS (Not Seen)  Appearance: Clear Ketones: Neg RBC/hpf: 3 - 10/hpf  Specific Gravity: 1.020 Blood: 2+ Bacteria: NS (Not Seen)  pH: 6.5 Protein: Neg Cystals: NS (Not Seen)  Glucose: Neg Urobilinogen: 0.2 Casts: NS (Not Seen)    Nitrites: Neg Trichomonas: Not Present    Leukocyte Esterase: Neg Mucous: Not Present      Epithelial Cells: 0 - 5/hpf      Yeast: NS (Not Seen)      Sperm: Not Present    ASSESSMENT:      ICD-10 Details  1 GU:   Renal and ureteral calculus - N20.2    PLAN:            Medications New Meds: Tamsulosin Hcl 0.4 mg capsule, ext release 24 hr 1 capsule PO Daily To help pass kidney stone  #14  1 Refill(s)  Hydrocodone-Acetaminophen 5 mg-325 mg tablet 1 tablet PO Q 6 H PRN Pain  #15  0 Refill(s)            Document Letter(s):  Created for Patient: Clinical Summary         Notes:   We discussed the management of urinary stones. These options include observation, ureteroscopy, and shockwave lithotripsy. We discussed which options are relevant to these particular stones. We discussed the natural history of stones as well as the complications of untreated stones and the impact on quality of life without treatment as well as with each of the above listed treatments. We also discussed the efficacy of each treatment in its ability to clear the stone burden. With any of these management options I discussed the signs and symptoms of infection and the need for emergent treatment should these be experienced. For each option we discussed the ability of  each procedure to clear the patient of their stone burden.   For observation I described the risks which include but are not limited to silent renal damage, life-threatening infection, need for emergent surgery, failure to pass stone, and pain.   For ureteroscopy I described the risks which include heart attack, stroke, pulmonary embolus, death, bleeding, infection, damage to contiguous structures, positioning injury, ureteral stricture, ureteral avulsion, ureteral injury, need for ureteral stent, inability to perform ureteroscopy, need for an interval procedure, inability to clear stone burden, stent discomfort and pain.   I provided her with a strainer. Also gave her prescription for hydrocodone as needed for pain. Given her continued right-sided pain and multiple stones in the right kidney on the prior CT scan, she would like to receive with right ureteroscopy, laser lithotripsy, ureteral stent      Signed by Link Snuffer, III, M.D. on 08/29/17 at 5:03 PM (  EDT)

## 2017-09-09 NOTE — Discharge Instructions (Addendum)
Post Anesthesia Home Care Instructions  Activity: Get plenty of rest for the remainder of the day. A responsible individual must stay with you for 24 hours following the procedure.  For the next 24 hours, DO NOT: -Drive a car -Paediatric nurse -Drink alcoholic beverages -Take any medication unless instructed by your physician -Make any legal decisions or sign important papers.  Meals: Start with liquid foods such as gelatin or soup. Progress to regular foods as tolerated. Avoid greasy, spicy, heavy foods. If nausea and/or vomiting occur, drink only clear liquids until the nausea and/or vomiting subsides. Call your physician if vomiting continues.  Special Instructions/Symptoms: Your throat may feel dry or sore from the anesthesia or the breathing tube placed in your throat during surgery. If this causes discomfort, gargle with warm salt water. The discomfort should disappear within 24 hours.  If you had a scopolamine patch placed behind your ear for the management of post- operative nausea and/or vomiting:  1. The medication in the patch is effective for 72 hours, after which it should be removed.  Wrap patch in a tissue and discard in the trash. Wash hands thoroughly with soap and water. 2. You may remove the patch earlier than 72 hours if you experience unpleasant side effects which may include dry mouth, dizziness or visual disturbances. 3. Avoid touching the patch. Wash your hands with soap and water after contact with the patch.   [facility] [phone] Post Ureteroscopy With or Without Stent Instructions  REMOVE YOUR STENT MONDAY MORNING AT 7AM  Definitions:  Ureter: The duct that transports urine from the kidney to the bladder. Stent:   A plastic hollow tube that is placed into the ureter, from the kidney to the                 bladder to prevent the ureter from swelling shut.  GENERAL INSTRUCTIONS:  Despite the fact that no skin incisions were used,  the area around the ureter and bladder is raw and irritated. The stent is a foreign body which will further irritate the bladder wall. This irritation is manifested by increased frequency of urination, both day and night, and by an increase in the urge to urinate. In some, the urge to urinate is present almost always. Sometimes the urge is strong enough that you may not be able to stop yourself from urinating. The only real cure is to remove the stent and then give time for the bladder wall to heal which can't be done until the danger of the ureter swelling shut has passed, which varies.  You may see some blood in your urine while the stent is in place and a few days afterwards. Do not be alarmed, even if the urine was clear for a while. Get off your feet and drink lots of fluids until clearing occurs. If you start to pass clots or don't improve, call us.  DIET: You may return to your normal diet immediately. Because of the raw surface of your bladder, alcohol, spicy foods, acid type foods and drinks with caffeine may cause irritation or frequency and should be used in moderation. To keep your urine flowing freely and to avoid constipation, drink plenty of fluids during the day ( 8-10 glasses ). Tip: Avoid cranberry juice because it is very acidic.  ACTIVITY: Your physical activity doesn't need to be restricted. However, if you are very active, you may see some blood in your urine. We suggest that you reduce your activity under these  circumstances until the bleeding has stopped.  BOWELS: It is important to keep your bowels regular during the postoperative period. Straining with bowel movements can cause bleeding. A bowel movement every other day is reasonable. Use a mild laxative if needed, such as Milk of Magnesia 2-3 tablespoons, or 2 Dulcolax tablets. Call if you continue to have problems. If you have been taking narcotics for pain, before, during or after your surgery, you may be constipated. Take a  laxative if necessary.   MEDICATION: You should resume your pre-surgery medications unless told not to. You may take oxybutynin or flomax if prescribed for bladder spasms or discomfort from the stent Take pain medication as directed for pain refractory to conservative management  PROBLEMS YOU SHOULD REPORT TO [place]:  Fevers over 100.5 Fahrenheit.  Heavy bleeding, or clots ( See above notes about blood in urine ).  Inability to urinate.  Drug reactions ( hives, rash, nausea, vomiting, diarrhea ).  Severe burning or pain with urination that is not improving.

## 2017-09-10 ENCOUNTER — Encounter (HOSPITAL_BASED_OUTPATIENT_CLINIC_OR_DEPARTMENT_OTHER): Payer: Self-pay | Admitting: Urology

## 2017-09-10 NOTE — Anesthesia Postprocedure Evaluation (Signed)
Anesthesia Post Note  Patient: Engineer, maintenance  Procedure(s) Performed: CYSTOSCOPY WITH RIGHT RETROGRADE PYELOGRAM, URETEROSCOPY , STONE BASKETRY AND STENT PLACEMENT (Right Renal)     Patient location during evaluation: PACU Anesthesia Type: General Level of consciousness: sedated Pain management: pain level controlled Vital Signs Assessment: post-procedure vital signs reviewed and stable Respiratory status: spontaneous breathing and respiratory function stable Cardiovascular status: stable Postop Assessment: no apparent nausea or vomiting Anesthetic complications: no    Last Vitals:  Vitals:   09/09/17 1445 09/09/17 1520  BP: 137/90 (!) 150/92  Pulse: 88 85  Resp: 16 16  Temp:  37.2 C  SpO2: 96% 100%    Last Pain:  Vitals:   09/09/17 1126  TempSrc: Oral                 Keigan Tafoya DANIEL
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
## 2017-09-17 ENCOUNTER — Ambulatory Visit (INDEPENDENT_AMBULATORY_CARE_PROVIDER_SITE_OTHER): Payer: 59 | Admitting: Psychiatry

## 2017-09-17 ENCOUNTER — Encounter (HOSPITAL_COMMUNITY): Payer: Self-pay | Admitting: Psychiatry

## 2017-09-17 VITALS — BP 130/90 | HR 95 | Ht 63.0 in | Wt 204.0 lb

## 2017-09-17 DIAGNOSIS — F4310 Post-traumatic stress disorder, unspecified: Secondary | ICD-10-CM

## 2017-09-17 DIAGNOSIS — R4583 Excessive crying of child, adolescent or adult: Secondary | ICD-10-CM

## 2017-09-17 DIAGNOSIS — R4582 Worries: Secondary | ICD-10-CM

## 2017-09-17 DIAGNOSIS — F419 Anxiety disorder, unspecified: Secondary | ICD-10-CM | POA: Diagnosis not present

## 2017-09-17 DIAGNOSIS — J449 Chronic obstructive pulmonary disease, unspecified: Secondary | ICD-10-CM | POA: Diagnosis not present

## 2017-09-17 DIAGNOSIS — G4700 Insomnia, unspecified: Secondary | ICD-10-CM

## 2017-09-17 DIAGNOSIS — R4584 Anhedonia: Secondary | ICD-10-CM

## 2017-09-17 DIAGNOSIS — M069 Rheumatoid arthritis, unspecified: Secondary | ICD-10-CM | POA: Diagnosis not present

## 2017-09-17 DIAGNOSIS — R450 Nervousness: Secondary | ICD-10-CM

## 2017-09-17 DIAGNOSIS — I10 Essential (primary) hypertension: Secondary | ICD-10-CM

## 2017-09-17 DIAGNOSIS — F331 Major depressive disorder, recurrent, moderate: Secondary | ICD-10-CM | POA: Diagnosis not present

## 2017-09-17 DIAGNOSIS — Z818 Family history of other mental and behavioral disorders: Secondary | ICD-10-CM

## 2017-09-17 DIAGNOSIS — R5383 Other fatigue: Secondary | ICD-10-CM | POA: Diagnosis not present

## 2017-09-17 DIAGNOSIS — C50919 Malignant neoplasm of unspecified site of unspecified female breast: Secondary | ICD-10-CM | POA: Diagnosis not present

## 2017-09-17 DIAGNOSIS — Z811 Family history of alcohol abuse and dependence: Secondary | ICD-10-CM

## 2017-09-17 DIAGNOSIS — F410 Panic disorder [episodic paroxysmal anxiety] without agoraphobia: Secondary | ICD-10-CM

## 2017-09-17 DIAGNOSIS — Z62810 Personal history of physical and sexual abuse in childhood: Secondary | ICD-10-CM

## 2017-09-17 MED ORDER — LORAZEPAM 0.5 MG PO TABS: ORAL_TABLET | ORAL | 0 refills | Status: DC

## 2017-09-17 MED ORDER — VENLAFAXINE HCL ER 75 MG PO CP24: 75.0000 mg | ORAL_CAPSULE | Freq: Every day | ORAL | 0 refills | Status: DC

## 2017-09-17 NOTE — Patient Instructions (Signed)
1. Increase venlafaxine 75 mg daily  2. Discontinue Xanax 3. Start ativan 0.25-0.5 mg daily as needed for anxiety  4. Return to clinic in one month for 30 mins

## 2017-10-12 ENCOUNTER — Encounter (HOSPITAL_COMMUNITY): Payer: Self-pay | Admitting: Emergency Medicine

## 2017-10-12 ENCOUNTER — Ambulatory Visit (HOSPITAL_COMMUNITY)
Admission: EM | Admit: 2017-10-12 | Discharge: 2017-10-12 | Disposition: A | Discharge status: Home / Self Care | Payer: 59 | Attending: Internal Medicine | Admitting: Internal Medicine

## 2017-10-12 DIAGNOSIS — M25532 Pain in left wrist: Secondary | ICD-10-CM

## 2017-10-12 MED ORDER — DEXAMETHASONE SODIUM PHOSPHATE 10 MG/ML IJ SOLN
INTRAMUSCULAR | Status: AC
  Filled 2017-10-12: qty 1

## 2017-10-12 MED ORDER — DEXAMETHASONE SODIUM PHOSPHATE 10 MG/ML IJ SOLN
10.0000 mg | Freq: Once | INTRAMUSCULAR | Status: AC
  Administered 2017-10-12: 10 mg via INTRAMUSCULAR

## 2017-10-12 MED ORDER — PREDNISONE 50 MG PO TABS: 50.0000 mg | ORAL_TABLET | Freq: Every day | ORAL | 0 refills | Status: AC

## 2017-10-12 NOTE — Discharge Instructions (Signed)
Dexamethasone injection in office today. Start prednisone as directed. Continue ibuprofen/tylenol for pain. Follow up with rheumatologist as scheduled for further evaluation and treatment needed.

## 2017-10-12 NOTE — ED Triage Notes (Signed)
Pt c/o left wrist pain onset Thursday. Patient reports hx of RA and take Methotrexate for that. Pt reports that pain is constant.

## 2017-10-12 NOTE — ED Provider Notes (Signed)
[place]    CSN: [id number] Arrival date & time: 10/12/17  1238     History   Chief Complaint Chief Complaint  Patient presents with  . Wrist Pain    HPI Joanne Garrett is a 41 y.o. female.   41 year old female with history of anxiety, breast cancer with lumpectomy, radiation therapy, and antiestrogen therapy, HTN, RA, comes in for 3 day history of left wrist pain. Denies injury. States wrist has been stiff with mild swelling, which is consistent with her usual RA flare. She states she has had some erythema and warmth to touch that has improved today. States has had decreased grip strength and ROM of wrist and fingers. She has been taking ibuprofen with little relief. Patient states she is taking methotrexate for her RA, she was originally on Rasuvo and Humira that gave her better control of her RA, but she switched insurance and is now required to have trial of methotrexate first. She denies numbness/tingling of the fingers, fevers. States wrist movement makes the pain worse.       Past Medical History:  Diagnosis Date  . Anxiety   . 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 Depression   . Genital herpes   . Hematuria   . History of hyperthyroidism 02/2001   s/p  RAI 03-2001  . History of panic attacks   . History of radiation therapy    completed 09-23-2012  50Gy in 25 fractions and boost completed [phone]  . History of thyroid nodule   . Hypertension   . Renal calculus, right   . Rheumatoid arthritis ([place])   . Urgency of urination     Patient Active Problem List   Diagnosis Date Noted  . PTSD (post-traumatic stress disorder) 09/17/2017  . MDD (major depressive disorder), recurrent episode, moderate ([place]) 09/17/2017  . HTN (hypertension) 01/16/2017  . Genital herpes 01/16/2017  . History of  thyroid disorder 01/16/2017  . Left thyroid nodule 01/16/2017  . Breast pain, left 01/16/2017  . History of breast cancer 01/16/2017    Past Surgical History:  Procedure Laterality Date  . ANTERIOR CERVICAL DECOMP/DISCECTOMY FUSION  05-18-2015   [facility]  . BREAST LUMPECTOMY Left 07-03-2012    [facility]  . CYSTOSCOPY WITH RETROGRADE PYELOGRAM, URETEROSCOPY AND STENT PLACEMENT Right 09/09/2017   Procedure: CYSTOSCOPY WITH RIGHT RETROGRADE PYELOGRAM, URETEROSCOPY , STONE BASKETRY AND STENT PLACEMENT;  Surgeon: Lucas Mallow, MD;  Location: [facility];  Service: Urology;  Laterality: Right;  . KNEE ARTHROSCOPY Left 2009  . TOTAL ABDOMINAL HYSTERECTOMY W/ BILATERAL SALPINGOOPHORECTOMY  2014    OB History    No data available       Home Medications    Prior to Admission medications   Medication Sig Start Date End Date Taking? Authorizing Provider  LORazepam (ATIVAN) 0.5 MG tablet 0.25-0.5 mg daily as needed for anxiety 09/17/17  Yes Hisada, Elie Goody, MD  methotrexate (50 MG/ML) 1 G injection Inject 40 mg into the vein once a week.    Yes [provider]  Multiple Vitamin (MULTIVITAMIN) tablet Take 1 tablet by mouth daily.   Yes [provider]  valACYclovir (VALTREX) 1000 MG tablet Take 1 tablet (1,000 mg total) by mouth 2 (two) times daily. X 5 days during an outbreak Patient taking differently: Take 1,000 mg by mouth  2 (two) times daily as needed. X 5 days during an outbreak 01/16/17  Yes Henson, Vickie L, NP-C  venlafaxine XR (EFFEXOR-XR) 75 MG 24 hr capsule Take 1 capsule (75 mg total) daily with breakfast by mouth. 09/17/17  Yes Hisada, Reina, MD  amLODipine (NORVASC) 10 MG tablet Take 1 tablet (10 mg total) by mouth daily. Patient taking differently: Take 10 mg by mouth every morning.  04/19/17   Henson, Vickie L, NP-C  predniSONE (DELTASONE) 50 MG tablet Take 1 tablet (50 mg total) by mouth daily for 4 days. 10/12/17 10/16/17  Ok Edwards, [name]    Family  History Family History  Problem Relation Age of Onset  . Hypertension Mother   . COPD Mother   . Depression Mother   . Hypertension Father   . Kidney disease Father   . Alcohol abuse Father   . Hypertension Brother   . Alzheimer's disease Paternal Grandmother     Social History Social History   Tobacco Use  . Smoking status: Never Smoker  . Smokeless tobacco: Never Used  Substance Use Topics  . Alcohol use: No  . Drug use: No     Allergies   Shellfish allergy; Bactrim [sulfamethoxazole-trimethoprim]; and Chocolate flavor   Review of Systems Review of Systems  Reason unable to perform ROS: See HPI as above.     Physical Exam Triage Vital Signs ED Triage Vitals  Enc Vitals Group     BP 10/12/17 1411 126/90     Pulse Rate 10/12/17 1411 78     Resp --      Temp 10/12/17 1411 98.4 F (36.9 C)     Temp Source 10/12/17 1411 Oral     SpO2 10/12/17 1411 98 %     Weight --      Height --      Head Circumference --      Peak Flow --      Pain Score 10/12/17 1413 3     Pain Loc --      Pain Edu? --      Excl. in [place]? --    No data found.  Updated Vital Signs BP 126/90 (BP Location: Left Arm)   Pulse 78   Temp 98.4 F (36.9 C) (Oral)   LMP 06/11/2013   SpO2 98%   Physical Exam  Constitutional: She is oriented to person, place, and time. She appears well-developed and well-nourished. No distress.  HENT:  Head: Normocephalic and atraumatic.  Eyes: Conjunctivae are normal. Pupils are equal, round, and reactive to light.  Musculoskeletal:  Mild swelling to the left wrist. No erythema, increased warmth. No obvious tenderness to palpation of the wrist. Limited extension of the left wrist. Full ROM of fingers. Decreased grip strength of left hand. Sensation intact and equal bilaterally. Radial pulse 2+ with cap refill <2s. Negative tinel, phalen, finkelstein.   Neurological: She is alert and oriented to person, place, and time.     UC Treatments / Results   Labs (all labs ordered are listed, but only abnormal results are displayed) Labs Reviewed - No data to display  EKG  EKG Interpretation None       Radiology No results found.  Procedures Procedures (including critical care time)  Medications Ordered in UC Medications  dexamethasone (DECADRON) injection 10 mg (10 mg Intramuscular Given 10/12/17 1504)     Initial Impression / Assessment and Plan / UC Course  I have reviewed the triage vital signs and the nursing notes.  Pertinent labs & imaging results that were available during my care of the patient were reviewed by me and considered in my medical decision making (see chart for details).    Patient with wrist pain consistent with symptoms of RA flare. Exam with mild swelling and decreased extension of wrist, will treat for RA flare. Dexamethasone injection in office. prednisone as directed. Discussed possible tendinitis, which treatment will also cover. Negative phalen/tinel, low suspicion for carpel tunnel at this time. Patient with scheduled follow up with rheumatologist in 4 days, to follow up for reevaluation and further treatment needed. Patient expresses understanding and agrees to plan.   Final Clinical Impressions(s) / UC Diagnoses   Final diagnoses:  Left wrist pain    ED Discharge Orders        Ordered    predniSONE (DELTASONE) 50 MG tablet  Daily     10/12/17 1500        Ok Edwards, [place] 10/12/17 1515

## 2017-10-16 DIAGNOSIS — Z87442 Personal history of urinary calculi: Secondary | ICD-10-CM | POA: Insufficient documentation

## 2017-11-04 ENCOUNTER — Other Ambulatory Visit (HOSPITAL_COMMUNITY): Payer: Self-pay | Admitting: Psychiatry

## 2017-11-04 ENCOUNTER — Other Ambulatory Visit: Payer: Self-pay | Admitting: Family Medicine

## 2017-11-04 ENCOUNTER — Telehealth (HOSPITAL_COMMUNITY): Payer: Self-pay

## 2017-11-04 MED ORDER — VENLAFAXINE HCL ER 75 MG PO CP24: 75.0000 mg | ORAL_CAPSULE | Freq: Every day | ORAL | 0 refills | Status: DC

## 2017-11-04 NOTE — Telephone Encounter (Signed)
Medication management - Message left for patient that Dr. Modesta Messing did send in a new Venlafaxin XR order to her [facility] but informed patient she would need to be seen for any further refills and requested she call this office back to set up a follow-up appointment with Dr. Modesta Messing within the next month.

## 2017-11-04 NOTE — Telephone Encounter (Signed)
Ok to give her one refill and have her schedule an appointment for HTN. Thanks.

## 2017-11-04 NOTE — Telephone Encounter (Signed)
Okay to refill last seen for hypertension in March, other 2 appointments since; but for acute issues. Okay to refill?  Thanks!

## 2017-11-04 NOTE — Telephone Encounter (Signed)
Ordered for a month. Please advise her to make follow up appointment for further refills.

## 2017-11-04 NOTE — Telephone Encounter (Signed)
Medication refill request - Fax received from pt's [facility] on [address] in [place] for a refill of Venlafaxine XR.  Patient seen for the first time 09/17/17 but never rescheduled a follow up appointment.

## 2017-11-19 DIAGNOSIS — R3121 Asymptomatic microscopic hematuria: Secondary | ICD-10-CM | POA: Diagnosis not present

## 2017-11-19 DIAGNOSIS — R350 Frequency of micturition: Secondary | ICD-10-CM | POA: Diagnosis not present

## 2017-11-19 DIAGNOSIS — R1084 Generalized abdominal pain: Secondary | ICD-10-CM | POA: Diagnosis not present

## 2017-11-29 ENCOUNTER — Other Ambulatory Visit (HOSPITAL_COMMUNITY): Payer: Self-pay | Admitting: Psychiatry

## 2017-11-29 MED ORDER — VENLAFAXINE HCL ER 75 MG PO CP24: 75.0000 mg | ORAL_CAPSULE | Freq: Every day | ORAL | 0 refills | Status: DC
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
## 2017-12-09 ENCOUNTER — Encounter (HOSPITAL_COMMUNITY): Payer: Self-pay | Admitting: Psychiatry

## 2017-12-09 ENCOUNTER — Ambulatory Visit (INDEPENDENT_AMBULATORY_CARE_PROVIDER_SITE_OTHER): Payer: BLUE CROSS/BLUE SHIELD | Admitting: Psychiatry

## 2017-12-09 VITALS — BP 137/87 | HR 79 | Ht 63.0 in | Wt 201.0 lb

## 2017-12-09 DIAGNOSIS — Z818 Family history of other mental and behavioral disorders: Secondary | ICD-10-CM

## 2017-12-09 DIAGNOSIS — F331 Major depressive disorder, recurrent, moderate: Secondary | ICD-10-CM

## 2017-12-09 DIAGNOSIS — I10 Essential (primary) hypertension: Secondary | ICD-10-CM

## 2017-12-09 DIAGNOSIS — Z811 Family history of alcohol abuse and dependence: Secondary | ICD-10-CM | POA: Diagnosis not present

## 2017-12-09 DIAGNOSIS — Z62810 Personal history of physical and sexual abuse in childhood: Secondary | ICD-10-CM | POA: Diagnosis not present

## 2017-12-09 DIAGNOSIS — F4310 Post-traumatic stress disorder, unspecified: Secondary | ICD-10-CM | POA: Diagnosis not present

## 2017-12-09 DIAGNOSIS — Z853 Personal history of malignant neoplasm of breast: Secondary | ICD-10-CM | POA: Diagnosis not present

## 2017-12-09 DIAGNOSIS — M069 Rheumatoid arthritis, unspecified: Secondary | ICD-10-CM

## 2017-12-09 DIAGNOSIS — Z79899 Other long term (current) drug therapy: Secondary | ICD-10-CM | POA: Diagnosis not present

## 2017-12-09 DIAGNOSIS — E0590 Thyrotoxicosis, unspecified without thyrotoxic crisis or storm: Secondary | ICD-10-CM

## 2017-12-09 MED ORDER — LORAZEPAM 0.5 MG PO TABS: ORAL_TABLET | ORAL | 1 refills | Status: DC

## 2017-12-09 MED ORDER — VENLAFAXINE HCL ER 75 MG PO CP24: 75.0000 mg | ORAL_CAPSULE | Freq: Every day | ORAL | 1 refills | Status: DC

## 2017-12-09 NOTE — Patient Instructions (Addendum)
1. Increase venlafaxine 75 mg daily  2. Cotinue ativan 0.25-0.5 mg daily as needed for anxiety  3. Return to clinic in two months for 15 mins

## 2017-12-10 DIAGNOSIS — R3121 Asymptomatic microscopic hematuria: Secondary | ICD-10-CM | POA: Diagnosis not present

## 2017-12-10 DIAGNOSIS — R350 Frequency of micturition: Secondary | ICD-10-CM | POA: Diagnosis not present

## 2017-12-15 ENCOUNTER — Emergency Department (HOSPITAL_COMMUNITY)
Admission: EM | Admit: 2017-12-15 | Discharge: 2017-12-15 | Disposition: A | Discharge status: Home / Self Care | Payer: BLUE CROSS/BLUE SHIELD | Attending: Emergency Medicine | Admitting: Emergency Medicine

## 2017-12-15 ENCOUNTER — Other Ambulatory Visit: Payer: Self-pay

## 2017-12-15 ENCOUNTER — Emergency Department (HOSPITAL_COMMUNITY): Payer: BLUE CROSS/BLUE SHIELD

## 2017-12-15 ENCOUNTER — Encounter (HOSPITAL_COMMUNITY): Payer: Self-pay | Admitting: Emergency Medicine

## 2017-12-15 DIAGNOSIS — M25551 Pain in right hip: Secondary | ICD-10-CM | POA: Diagnosis not present

## 2017-12-15 DIAGNOSIS — I10 Essential (primary) hypertension: Secondary | ICD-10-CM | POA: Diagnosis not present

## 2017-12-15 DIAGNOSIS — F419 Anxiety disorder, unspecified: Secondary | ICD-10-CM | POA: Insufficient documentation

## 2017-12-15 DIAGNOSIS — Z79899 Other long term (current) drug therapy: Secondary | ICD-10-CM | POA: Insufficient documentation

## 2017-12-15 DIAGNOSIS — F329 Major depressive disorder, single episode, unspecified: Secondary | ICD-10-CM | POA: Insufficient documentation

## 2017-12-15 DIAGNOSIS — E0590 Thyrotoxicosis, unspecified without thyrotoxic crisis or storm: Secondary | ICD-10-CM | POA: Insufficient documentation

## 2017-12-15 DIAGNOSIS — Z853 Personal history of malignant neoplasm of breast: Secondary | ICD-10-CM | POA: Diagnosis not present

## 2017-12-15 NOTE — ED Provider Notes (Signed)
[facility] EMERGENCY DEPARTMENT Provider Note   CSN: [id number] Arrival date & time: 12/15/17  3664     History   Chief Complaint Chief Complaint  Patient presents with  . Hip Pain    HPI Avonda Toso is a 42 y.o. female with a past medical history of rheumatoid arthritis, nephrolithiasis, hypertension, who presents to ED for evaluation of 2-day history of "aching" right hip pain that is worse with movement.  States that the symptoms also occurred for a few days last week which improved with Aleve, steroids, 1 dose of Vicodin.  The pain returned 2 days ago after work.  She thought it was a pulled muscle so she applied heat and stretch the area with no improvement in her symptoms.  She uses weekly methotrexate for her rheumatoid arthritis in her hands.  She denies any fevers, previous surgeries in the area, previous fractures, fever, changes in gait, numbness in legs, back pain, dysuria or other urinary symptoms.  HPI  Past Medical History:  Diagnosis Date  . Anxiety   . 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 Depression   . Genital herpes   . Hematuria   . History of hyperthyroidism 02/2001   s/p  RAI 03-2001  . History of panic attacks   . History of radiation therapy    completed 09-23-2012  50Gy in 25 fractions and boost completed [id number]  . History of thyroid nodule   . Hypertension   . Renal calculus, right   . Rheumatoid arthritis ([place])   . Urgency of urination     Patient Active Problem List   Diagnosis Date Noted  . PTSD (post-traumatic stress disorder) 09/17/2017  . MDD (major depressive disorder), recurrent episode, moderate ([place]) 09/17/2017  . HTN (hypertension) 01/16/2017  . Genital herpes 01/16/2017  . History of thyroid disorder 01/16/2017  . Left thyroid nodule 01/16/2017    . Breast pain, left 01/16/2017  . History of breast cancer 01/16/2017    Past Surgical History:  Procedure Laterality Date  . ANTERIOR CERVICAL DECOMP/DISCECTOMY FUSION  05-18-2015   [facility]  . BREAST LUMPECTOMY Left 07-03-2012    [facility]  . CYSTOSCOPY WITH RETROGRADE PYELOGRAM, URETEROSCOPY AND STENT PLACEMENT Right 09/09/2017   Procedure: CYSTOSCOPY WITH RIGHT RETROGRADE PYELOGRAM, URETEROSCOPY , STONE BASKETRY AND STENT PLACEMENT;  Surgeon: Lucas Mallow, MD;  Location: [facility];  Service: Urology;  Laterality: Right;  . KNEE ARTHROSCOPY Left 2009  . TOTAL ABDOMINAL HYSTERECTOMY W/ BILATERAL SALPINGOOPHORECTOMY  2014    OB History    No data available       Home Medications    Prior to Admission medications   Medication Sig Start Date End Date Taking? Authorizing Provider  amLODipine (NORVASC) 10 MG tablet TAKE 1 TABLET BY MOUTH DAILY 11/04/17   Henson, Vickie L, NP-C  LORazepam (ATIVAN) 0.5 MG tablet 0.25-0.5 mg daily as needed for anxiety 12/09/17   Norman Clay, MD  methotrexate (50 MG/ML) 1 G injection Inject 40 mg into the vein once a week.     [provider]  Multiple Vitamin (MULTIVITAMIN) tablet Take 1 tablet by mouth daily.    [provider]  valACYclovir (VALTREX) 1000 MG tablet Take 1 tablet (1,000 mg total) by mouth 2 (two) times daily. X 5 days during an outbreak Patient taking differently:  Take 1,000 mg by mouth 2 (two) times daily as needed. X 5 days during an outbreak 01/16/17   Harland Dingwall L, NP-C  venlafaxine XR (EFFEXOR-XR) 75 MG 24 hr capsule Take 1 capsule (75 mg total) by mouth daily with breakfast. 12/09/17   Norman Clay, MD    Family History Family History  Problem Relation Age of Onset  . Hypertension Mother   . COPD Mother   . Depression Mother   . Hypertension Father   . Kidney disease Father   . Alcohol abuse Father   . Hypertension Brother   . Alzheimer's disease Paternal Grandmother     Social  History Social History   Tobacco Use  . Smoking status: Never Smoker  . Smokeless tobacco: Never Used  Substance Use Topics  . Alcohol use: No  . Drug use: No     Allergies   Shellfish allergy; Bactrim [sulfamethoxazole-trimethoprim]; and Chocolate flavor   Review of Systems Review of Systems  Constitutional: Negative for chills and fever.  Musculoskeletal: Positive for arthralgias. Negative for back pain, gait problem, joint swelling and myalgias.  Skin: Negative for wound.  Neurological: Negative for weakness and numbness.     Physical Exam Updated Vital Signs BP (!) 126/92 (BP Location: Right Arm)   Pulse 79   Temp 98.8 F (37.1 C) (Oral)   Resp 18   Ht 5\' 3"  (1.6 m)   Wt 91.2 kg (201 lb)   LMP 06/11/2013   SpO2 100%   BMI 35.61 kg/m   Physical Exam  Constitutional: She appears well-developed and well-nourished. No distress.  Nontoxic appearing and in no acute distress.  HENT:  Head: Normocephalic and atraumatic.  Eyes: Conjunctivae and EOM are normal. No scleral icterus.  Neck: Normal range of motion.  Pulmonary/Chest: Effort normal. No respiratory distress.  Musculoskeletal: Normal range of motion. She exhibits tenderness. She exhibits no edema or deformity.  Tenderness to palpation of the right hip joint.  Pelvis appears stable.  No visible deformity of hips or leg.  Able to perform full active and passive range of motion of right hip.  Normal gait noted. Sensation intact to light touch of bilateral lower extremities. Strength 5/5 in bilateral lower extremities.  Neurological: She is alert.  Skin: No rash noted. She is not diaphoretic.  Psychiatric: She has a normal mood and affect.  Nursing note and vitals reviewed.    ED Treatments / Results  Labs (all labs ordered are listed, but only abnormal results are displayed) Labs Reviewed - No data to display  EKG  EKG Interpretation None       Radiology Dg Hip Unilat W Or Wo Pelvis 2-3 Views  Right  Result Date: 12/15/2017 CLINICAL DATA:  Acute right hip pain for 1 week.  Initial encounter. EXAM: DG HIP (WITH OR WITHOUT PELVIS) 2-3V RIGHT COMPARISON:  None. FINDINGS: No acute fracture or dislocation. Mild degenerative changes in the left hip noted. No suspicious bony lesions are present. No right hip abnormalities are identified. IMPRESSION: No evidence of right hip abnormality. Mild left hip degenerative changes. Electronically Signed   By: Margarette Canada M.D.   On: 12/15/2017 10:18    Procedures Procedures (including critical care time)  Medications Ordered in ED Medications - No data to display   Initial Impression / Assessment and Plan / ED Course  I have reviewed the triage vital signs and the nursing notes.  Pertinent labs & imaging results that were available during my care of the patient were  reviewed by me and considered in my medical decision making (see chart for details).     The patient presents to ED for evaluation of right hip pain for 2 days.  She does have a history of rheumatoid arthritis in her hand joints which she takes weekly methotrexate for.  She had similar symptoms last week which improved with anti-inflammatories, steroids and narcotic pain medication.  No previous fracture, dislocations or procedures in the area.  She has been ambulatory with normal gait since the pain started.  She was able to perform full active and passive range of motion of the hip on my examination with no changes in sensation or pulses of the lower extremity.  Her x-ray was negative for acute abnormality but did show mild hip degenerative changes on the left side.  I suspect that her symptoms could be due from overuse job.  Advised her to continue her home medications as previously prescribed and to follow-up with her primary care provider for further evaluation.  Patient appears stable for discharge at this time.  Strict return precautions given.  Portions of this note were generated  with [employer]. Dictation errors may occur despite best attempts at proofreading.   Final Clinical Impressions(s) / ED Diagnoses   Final diagnoses:  Right hip pain    ED Discharge Orders    None       Delia Heady, [name] 12/15/17 1042    Margette Fast, MD 12/16/17 [id number]

## 2017-12-15 NOTE — ED Notes (Addendum)
The patient is upset that she is "not being prescribed prednisone." PA did not feel the pt needed a prescription for this and advised her to follow up with PCP. PT angry and states "I am going to urgent care to get some proper care, yall wasted my time." Refused wheelchair

## 2017-12-15 NOTE — ED Triage Notes (Signed)
Pt. Stated, Im having rt. Hip pain , no injury or fall. Started yesterday

## 2017-12-15 NOTE — Discharge Instructions (Signed)
Continue your home medications as previously prescribed. Follow-up with your primary care provider for further evaluation. Return to ED for worsening symptoms, numbness in legs, injuries or falls, changes with ambulation.

## 2018-01-10 ENCOUNTER — Telehealth: Payer: Self-pay | Admitting: Family Medicine

## 2018-01-10 MED ORDER — AMLODIPINE BESYLATE 10 MG PO TABS: 10.0000 mg | ORAL_TABLET | Freq: Every day | ORAL | 0 refills | Status: DC

## 2018-01-10 NOTE — Telephone Encounter (Signed)
Pt scheduled a CPE on 01/31/18. She need a refill on Amlodipine 10 mg to last until appt

## 2018-01-10 NOTE — Telephone Encounter (Signed)
Sent med to pharmacy  

## 2018-01-27 DIAGNOSIS — M25511 Pain in right shoulder: Secondary | ICD-10-CM | POA: Diagnosis not present

## 2018-01-30 NOTE — Progress Notes (Signed)
Subjective:    Patient ID: Joanne Garrett, female    DOB: 1976/06/29, 42 y.o.   MRN: [id number]  HPI Chief Complaint  Patient presents with  . fasting cpe    fasting cpe, no other concerns. hysterectomy 2014   She is here for a complete physical exam. Reports feeling good and doing well. She has a cruise in June and is looking forward to this.   Complains of decreased hearing in her right ear. Notices this at work, she has to wear a headset.   Dr. Gloriann Loan is her urologist - she is seeing him for hematuria, kidney stones.  She is scheduled to have a MRI on her kidneys. Dr. Abner Greenspan is her rheumatologist and is managing her RA. Reports good medication compliance.  [facility] in the past.   History of thyroid nodule and hyperthyroidism and thyroid uptake scan. Took synthroid for years and it was stopped.   She has not seen her counselor in 3 months due to finances. She is doing fine, no depression. Managing her anxiety.   History of breast cancer. States her oncologist released her last year. Due for her mammogram.   HTN- does not check her BP at home. Doing fine on amlodipine.   Social history: Lives alone, works at [employer]  Denies smoking, drinking alcohol, drug use  Diet: fairly healthy, more vegetables. Cutting back on sweets Excerise: some days   Immunizations: up to date   Health maintenance:  Mammogram: due now  Colonoscopy: never  Last Gynecological Exam: total hysterectomy  Last Menstrual cycle: total hysterectomy  Last Dental Exam: due this year Last Eye Exam: due this year  Wears seatbelt always, smoke detectors in home and functioning, does not text while driving and feels safe in home environment.   Reviewed allergies, medications, past medical, surgical, family, and social history.     Review of Systems Review of Systems Constitutional: -fever, -chills, -sweats, -unexpected weight change,-fatigue ENT: -runny nose, -ear pain, -sore throat Cardiology:   -chest pain, -palpitations, -edema Respiratory: -cough, -shortness of breath, -wheezing Gastroenterology: -abdominal pain, -nausea, -vomiting, -diarrhea, -constipation  Hematology: -bleeding or bruising problems Musculoskeletal: -arthralgias, -myalgias, -joint swelling, -back pain Ophthalmology: -vision changes Urology: -dysuria, -difficulty urinating, -hematuria, -urinary frequency, -urgency Neurology: -headache, -weakness, -tingling, -numbness       Objective:   Physical Exam BP 130/80   Pulse 77   Ht 5' 4.5" (1.638 m)   Wt 199 lb 9.6 oz (90.5 kg)   LMP 06/11/2013   BMI 33.73 kg/m   General Appearance:    Alert, cooperative, no distress, appears stated age  Head:    Normocephalic, without obvious abnormality, atraumatic  Eyes:    PERRL, conjunctiva/corneas clear, EOM's intact, fundi    benign  Ears:    Normal TM's and external ear canals  Nose:   Nares normal, mucosa normal, no drainage or sinus   tenderness  Throat:   Lips, mucosa, and tongue normal; teeth and gums normal  Neck:   Supple, no lymphadenopathy;  thyroid:  no   Enlargement/tenderness, left thyroid nodule palpated; no carotid   bruit or JVD  Back:    Spine nontender, no curvature, ROM normal, no CVA     tenderness  Lungs:     Clear to auscultation bilaterally without wheezes, rales or     ronchi; respirations unlabored  Chest Wall:    No tenderness or deformity   Heart:    Regular rate and rhythm, S1 and S2 normal, no murmur,  rub   or gallop  Breast Exam:    Mammogram ordered   Abdomen:     Soft, non-tender, nondistended, normoactive bowel sounds,    no masses, no hepatosplenomegaly  Genitalia:    Declines. Total hysterectomy      Extremities:   No clubbing, cyanosis or edema  Pulses:   2+ and symmetric all extremities  Skin:   Skin color, texture, turgor normal, no rashes or lesions  Lymph nodes:   Cervical, supraclavicular, and axillary nodes normal  Neurologic:   CNII-XII intact, normal strength,  sensation and gait; reflexes 2+ and symmetric throughout          Psych:   Normal mood, affect, hygiene and grooming.    Urinalysis dipstick: moderate blood, trace protein (chronic hematuria)       Assessment & Plan:  Routine general medical examination at a health care facility - Plan: POCT Urinalysis DIP (Proadvantage Device), CBC with Differential/Platelet, Comprehensive metabolic panel, Lipid panel  Rheumatoid arthritis involving both hands with positive rheumatoid factor (HCC)  Left thyroid nodule - Plan: T4, free, TSH, US THYROID  Essential hypertension - Plan: CBC with Differential/Platelet, Comprehensive metabolic panel, amLODipine (NORVASC) 10 MG tablet  Genital herpes simplex, unspecified site  MDD (major depressive disorder), recurrent episode, moderate (HCC)  History of thyroid disorder - Plan: T4, free, TSH, US THYROID  History of breast cancer - Plan: MM SCREENING BREAST TOMO BILATERAL  Decreased hearing, right  Recurrent and persistent hematuria  Obesity (BMI 30.0-34.9) - Plan: CBC with Differential/Platelet, Comprehensive metabolic panel, TSH, Lipid panel  She appears to be doing well overall.  She does have a complex medical history for her age. Her mood has been stable.  She is doing well on Effexor takes Ativan as needed.  She has not been able to afford a counselor for the past 3 months. No recent herpes outbreaks.  She takes Valtrex as needed.  Generally only 2 outbreaks per year.   She is under the care of Dr. Abner Greenspan for RA and appears to be doing fine on methotrexate. Her blood pressure today is at goal range and we will continue on amlodipine for now.  She does not check her blood pressure at home but I did encourage her to spot check it periodically. In depth counseling done on healthy diet and lifestyle for hypertension and weight loss. Specifically I recommend that she cut back on portion sizes by using a smaller dinner plate.  She is aware that she has  blood in her urine today.  This is a chronic issue and she is under the care of a urologist for this.  She also plans on seeing a nephrologist in the near future. She is up-to-date on Tdap and flu shot.   she will consider pneumonia shot at her next visit.  She will call and schedule her mammogram. Mild decreased hearing in right ear. [facility].  History of hyperthyroidism and was on Synthroid years ago.  Her last thyroid ultrasound was in 2002.  Plan to check thyroid panel and send her for ultrasound today due to thyroid nodule. We will follow-up pending labs and thyroid ultrasound.

## 2018-01-31 ENCOUNTER — Encounter: Payer: Self-pay | Admitting: Family Medicine

## 2018-01-31 ENCOUNTER — Ambulatory Visit (INDEPENDENT_AMBULATORY_CARE_PROVIDER_SITE_OTHER): Payer: BLUE CROSS/BLUE SHIELD | Admitting: Family Medicine

## 2018-01-31 VITALS — BP 130/80 | HR 77 | Ht 64.5 in | Wt 199.6 lb

## 2018-01-31 DIAGNOSIS — E669 Obesity, unspecified: Secondary | ICD-10-CM | POA: Insufficient documentation

## 2018-01-31 DIAGNOSIS — Z8639 Personal history of other endocrine, nutritional and metabolic disease: Secondary | ICD-10-CM | POA: Diagnosis not present

## 2018-01-31 DIAGNOSIS — F331 Major depressive disorder, recurrent, moderate: Secondary | ICD-10-CM | POA: Diagnosis not present

## 2018-01-31 DIAGNOSIS — A6000 Herpesviral infection of urogenital system, unspecified: Secondary | ICD-10-CM

## 2018-01-31 DIAGNOSIS — M05742 Rheumatoid arthritis with rheumatoid factor of left hand without organ or systems involvement: Secondary | ICD-10-CM | POA: Diagnosis not present

## 2018-01-31 DIAGNOSIS — E041 Nontoxic single thyroid nodule: Secondary | ICD-10-CM | POA: Diagnosis not present

## 2018-01-31 DIAGNOSIS — H9191 Unspecified hearing loss, right ear: Secondary | ICD-10-CM

## 2018-01-31 DIAGNOSIS — Z0000 Encounter for general adult medical examination without abnormal findings: Secondary | ICD-10-CM

## 2018-01-31 DIAGNOSIS — I10 Essential (primary) hypertension: Secondary | ICD-10-CM

## 2018-01-31 DIAGNOSIS — Z853 Personal history of malignant neoplasm of breast: Secondary | ICD-10-CM | POA: Diagnosis not present

## 2018-01-31 DIAGNOSIS — M05741 Rheumatoid arthritis with rheumatoid factor of right hand without organ or systems involvement: Secondary | ICD-10-CM | POA: Diagnosis not present

## 2018-01-31 DIAGNOSIS — N029 Recurrent and persistent hematuria with unspecified morphologic changes: Secondary | ICD-10-CM

## 2018-01-31 DIAGNOSIS — M069 Rheumatoid arthritis, unspecified: Secondary | ICD-10-CM | POA: Insufficient documentation

## 2018-01-31 LAB — POCT URINALYSIS DIP (PROADVANTAGE DEVICE)
Bilirubin, UA: NEGATIVE
Glucose, UA: NEGATIVE mg/dL
Ketones, POC UA: NEGATIVE mg/dL
Leukocytes, UA: NEGATIVE
Nitrite, UA: NEGATIVE
Protein Ur, POC: 30 mg/dL — AB
Specific Gravity, Urine: 1.015
Urobilinogen, Ur: NEGATIVE
pH, UA: 7 (ref 5.0–8.0)

## 2018-01-31 MED ORDER — AMLODIPINE BESYLATE 10 MG PO TABS: 10.0000 mg | ORAL_TABLET | Freq: Every day | ORAL | 2 refills | Status: DC

## 2018-01-31 NOTE — Patient Instructions (Signed)
Call and schedule your mammogram  I am sending you for a thyroid US due to thyroid nodule and history of thyroid disease.   Your blood pressure today is 130/80. Goal range is <130/80.  Continue on your current medication.   Make sure you are eating healthy, low salt.  Start using a smaller size dinner plate and after eating the first plate, weight 10 minutes before going back for seconds.  Have healthy snacks on hand.  Make sure you are getting at least 150 minutes of physical activity each week in addition to your regular daily activities.  Make sure you follow-up with your urologist.  You still have blood in your urine.   Preventative Care for Adults - Female      MAINTAIN REGULAR HEALTH EXAMS:  A routine yearly physical is a good way to check in with your primary care provider about your health and preventive screening. It is also an opportunity to share updates about your health and any concerns you have, and receive a thorough all-over exam.   Most health insurance companies pay for at least some preventative services.  Check with your health plan for specific coverages.  WHAT PREVENTATIVE SERVICES DO WOMEN NEED?  Adult women should have their weight and blood pressure checked regularly.   Women age 81 and older should have their cholesterol levels checked regularly.  Women should be screened for cervical cancer with a Pap smear and pelvic exam beginning at age 83.  Breast cancer screening generally begins at age 76 with a mammogram and breast exam by your primary care provider.    Beginning at age 58 and continuing to age 45, women should be screened for colorectal cancer.  Certain people may need continued testing until age 51.  Updating vaccinations is part of preventative care.  Vaccinations help protect against diseases such as the flu.  Osteoporosis is a disease in which the bones lose minerals and strength as we age. Women ages 22 and over should discuss this with their  caregivers, as should women after menopause who have other risk factors.  Lab tests are generally done as part of preventative care to screen for anemia and blood disorders, to screen for problems with the kidneys and liver, to screen for bladder problems, to check blood sugar, and to check your cholesterol level.  Preventative services generally include counseling about diet, exercise, avoiding tobacco, drugs, excessive alcohol consumption, and sexually transmitted infections.    GENERAL RECOMMENDATIONS FOR GOOD HEALTH:  Healthy diet:  Eat a variety of foods, including fruit, vegetables, animal or vegetable protein, such as meat, fish, chicken, and eggs, or beans, lentils, tofu, and grains, such as rice.  Drink plenty of water daily.  Decrease saturated fat in the diet, avoid lots of red meat, processed foods, sweets, fast foods, and fried foods.  Exercise:  Aerobic exercise helps maintain good heart health. At least 30-40 minutes of moderate-intensity exercise is recommended. For example, a brisk walk that increases your heart rate and breathing. This should be done on most days of the week.   Find a type of exercise or a variety of exercises that you enjoy so that it becomes a part of your daily life.  Examples are running, walking, swimming, water aerobics, and biking.  For motivation and support, explore group exercise such as aerobic class, spin class, Zumba, Yoga,or  martial arts, etc.    Set exercise goals for yourself, such as a certain weight goal, walk or run in a  race such as a 5k walk/run.  Speak to your primary care provider about exercise goals.  Disease prevention:  If you smoke or chew tobacco, find out from your caregiver how to quit. It can literally save your life, no matter how long you have been a tobacco user. If you do not use tobacco, never begin.   Maintain a healthy diet and normal weight. Increased weight leads to problems with blood pressure and diabetes.    The Body Mass Index or BMI is a way of measuring how much of your body is fat. Having a BMI above 27 increases the risk of heart disease, diabetes, hypertension, stroke and other problems related to obesity. Your caregiver can help determine your BMI and based on it develop an exercise and dietary program to help you achieve or maintain this important measurement at a healthful level.  High blood pressure causes heart and blood vessel problems.  Persistent high blood pressure should be treated with medicine if weight loss and exercise do not work.   Fat and cholesterol leaves deposits in your arteries that can block them. This causes heart disease and vessel disease elsewhere in your body.  If your cholesterol is found to be high, or if you have heart disease or certain other medical conditions, then you may need to have your cholesterol monitored frequently and be treated with medication.   Ask if you should have a cardiac stress test if your history suggests this. A stress test is a test done on a treadmill that looks for heart disease. This test can find disease prior to there being a problem.  Menopause can be associated with physical symptoms and risks. Hormone replacement therapy is available to decrease these. You should talk to your caregiver about whether starting or continuing to take hormones is right for you.   Osteoporosis is a disease in which the bones lose minerals and strength as we age. This can result in serious bone fractures. Risk of osteoporosis can be identified using a bone density scan. Women ages 5 and over should discuss this with their caregivers, as should women after menopause who have other risk factors. Ask your caregiver whether you should be taking a calcium supplement and Vitamin D, to reduce the rate of osteoporosis.   Avoid drinking alcohol in excess (more than two drinks per day).  Avoid use of street drugs. Do not share needles with anyone. Ask for professional  help if you need assistance or instructions on stopping the use of alcohol, cigarettes, and/or drugs.  Brush your teeth twice a day with fluoride toothpaste, and floss once a day. Good oral hygiene prevents tooth decay and gum disease. The problems can be painful, unattractive, and can cause other health problems. Visit your dentist for a routine oral and dental check up and preventive care every 6-12 months.   Look at your skin regularly.  Use a mirror to look at your back. Notify your caregivers of changes in moles, especially if there are changes in shapes, colors, a size larger than a pencil eraser, an irregular border, or development of new moles.  Safety:  Use seatbelts 100% of the time, whether driving or as a passenger.  Use safety devices such as hearing protection if you work in environments with loud noise or significant background noise.  Use safety glasses when doing any work that could send debris in to the eyes.  Use a helmet if you ride a bike or motorcycle.  Use appropriate safety  gear for contact sports.  Talk to your caregiver about gun safety.  Use sunscreen with a SPF (or skin protection factor) of 15 or greater.  Lighter skinned people are at a greater risk of skin cancer. Don't forget to also wear sunglasses in order to protect your eyes from too much damaging sunlight. Damaging sunlight can accelerate cataract formation.   Practice safe sex. Use condoms. Condoms are used for birth control and to help reduce the spread of sexually transmitted infections (or STIs).  Some of the STIs are gonorrhea (the clap), chlamydia, syphilis, trichomonas, herpes, HPV (human papilloma virus) and HIV (human immunodeficiency virus) which causes AIDS. The herpes, HIV and HPV are viral illnesses that have no cure. These can result in disability, cancer and death.   Keep carbon monoxide and smoke detectors in your home functioning at all times. Change the batteries every 6 months or use a model that  plugs into the wall.   Vaccinations:  Stay up to date with your tetanus shots and other required immunizations. You should have a booster for tetanus every 10 years. Be sure to get your flu shot every year, since 5%-20% of the [place] population comes down with the flu. The flu vaccine changes each year, so being vaccinated once is not enough. Get your shot in the fall, before the flu season peaks.   Other vaccines to consider:  Human Papilloma Virus or HPV causes cancer of the cervix, and other infections that can be transmitted from person to person. There is a vaccine for HPV, and females should get immunized between the ages of 71 and 24. It requires a series of 3 shots.   Pneumococcal vaccine to protect against certain types of pneumonia.  This is normally recommended for adults age 73 or older.  However, adults younger than 43 years old with certain underlying conditions such as diabetes, heart or lung disease should also receive the vaccine.  Shingles vaccine to protect against Varicella Zoster if you are older than age 23, or younger than 43 years old with certain underlying illness.  Hepatitis A vaccine to protect against a form of infection of the liver by a virus acquired from food.  Hepatitis B vaccine to protect against a form of infection of the liver by a virus acquired from blood or body fluids, particularly if you work in health care.  If you plan to travel internationally, check with your local health department for specific vaccination recommendations.  Cancer Screening:  Breast cancer screening is essential to preventive care for women. All women age 72 and older should perform a breast self-exam every month. At age 70 and older, women should have their caregiver complete a breast exam each year. Women at ages 79 and older should have a mammogram (x-ray film) of the breasts. Your caregiver can discuss how often you need mammograms.    Cervical cancer screening includes taking  a Pap smear (sample of cells examined under a microscope) from the cervix (end of the uterus). It also includes testing for HPV (Human Papilloma Virus, which can cause cervical cancer). Screening and a pelvic exam should begin at age 80, or 3 years after a woman becomes sexually active. Screening should occur every year, with a Pap smear but no HPV testing, up to age 29. After age 8, you should have a Pap smear every 3 years with HPV testing, if no HPV was found previously.   Most routine colon cancer screening begins at the age of 34.  On a yearly basis, doctors may provide special easy to use take-home tests to check for hidden blood in the stool. Sigmoidoscopy or colonoscopy can detect the earliest forms of colon cancer and is life saving. These tests use a small camera at the end of a tube to directly examine the colon. Speak to your caregiver about this at age 18, when routine screening begins (and is repeated every 5 years unless early forms of pre-cancerous polyps or small growths are found).

## 2018-02-01 LAB — COMPREHENSIVE METABOLIC PANEL
ALT: 22 IU/L (ref 0–32)
AST: 18 IU/L (ref 0–40)
Albumin/Globulin Ratio: 1.6 (ref 1.2–2.2)
Albumin: 4.9 g/dL (ref 3.5–5.5)
Alkaline Phosphatase: 91 IU/L (ref 39–117)
BUN/Creatinine Ratio: 18 (ref 9–23)
BUN: 12 mg/dL (ref 6–24)
Bilirubin Total: 0.3 mg/dL (ref 0.0–1.2)
CO2: 26 mmol/L (ref 20–29)
Calcium: 9.7 mg/dL (ref 8.7–10.2)
Chloride: 103 mmol/L (ref 96–106)
Creatinine, Ser: 0.66 mg/dL (ref 0.57–1.00)
GFR calc Af Amer: 125 mL/min/{1.73_m2} (ref >59)
GFR calc non Af Amer: 109 mL/min/{1.73_m2} (ref >59)
Globulin, Total: 3 g/dL (ref 1.5–4.5)
Glucose: 74 mg/dL (ref 65–99)
Potassium: 3.5 mmol/L (ref 3.5–5.2)
Sodium: 144 mmol/L (ref 134–144)
Total Protein: 7.9 g/dL (ref 6.0–8.5)

## 2018-02-01 LAB — CBC WITH DIFFERENTIAL/PLATELET
Basophils Absolute: 0 10*3/uL (ref 0.0–0.2)
Basos: 0 %
EOS (ABSOLUTE): 0.1 10*3/uL (ref 0.0–0.4)
Eos: 1 %
Hematocrit: 41.1 % (ref 34.0–46.6)
Hemoglobin: 13.3 g/dL (ref 11.1–15.9)
Immature Grans (Abs): 0 10*3/uL (ref 0.0–0.1)
Immature Granulocytes: 0 %
Lymphocytes Absolute: 2.4 10*3/uL (ref 0.7–3.1)
Lymphs: 27 %
MCH: 27.5 pg (ref 26.6–33.0)
MCHC: 32.4 g/dL (ref 31.5–35.7)
MCV: 85 fL (ref 79–97)
Monocytes Absolute: 0.6 10*3/uL (ref 0.1–0.9)
Monocytes: 7 %
Neutrophils Absolute: 5.7 10*3/uL (ref 1.4–7.0)
Neutrophils: 65 %
Platelets: 268 10*3/uL (ref 150–379)
RBC: 4.83 x10E6/uL (ref 3.77–5.28)
RDW: 15 % (ref 12.3–15.4)
WBC: 8.8 10*3/uL (ref 3.4–10.8)

## 2018-02-01 LAB — LIPID PANEL
Chol/HDL Ratio: 2.2 ratio (ref 0.0–4.4)
Cholesterol, Total: 206 mg/dL — ABNORMAL HIGH (ref 100–199)
HDL: 93 mg/dL (ref >39)
LDL Calculated: 100 mg/dL — ABNORMAL HIGH (ref 0–99)
Triglycerides: 64 mg/dL (ref 0–149)
VLDL Cholesterol Cal: 13 mg/dL (ref 5–40)

## 2018-02-01 LAB — T4, FREE: Free T4: 0.91 ng/dL (ref 0.82–1.77)

## 2018-02-01 LAB — TSH: TSH: 3.54 u[IU]/mL (ref 0.450–4.500)

## 2018-02-03 ENCOUNTER — Other Ambulatory Visit: Payer: Self-pay | Admitting: Family Medicine

## 2018-02-03 DIAGNOSIS — Z853 Personal history of malignant neoplasm of breast: Secondary | ICD-10-CM

## 2018-02-04 ENCOUNTER — Other Ambulatory Visit: Payer: Self-pay
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
## 2018-02-05 ENCOUNTER — Ambulatory Visit (INDEPENDENT_AMBULATORY_CARE_PROVIDER_SITE_OTHER): Payer: BLUE CROSS/BLUE SHIELD | Admitting: Family Medicine

## 2018-02-05 ENCOUNTER — Encounter: Payer: Self-pay | Admitting: Family Medicine

## 2018-02-05 VITALS — BP 122/80 | HR 93 | Temp 99.9°F | Ht 64.0 in | Wt 198.2 lb

## 2018-02-05 DIAGNOSIS — J029 Acute pharyngitis, unspecified: Secondary | ICD-10-CM | POA: Diagnosis not present

## 2018-02-05 DIAGNOSIS — J020 Streptococcal pharyngitis: Secondary | ICD-10-CM | POA: Diagnosis not present

## 2018-02-05 LAB — POCT RAPID STREP A (OFFICE): Rapid Strep A Screen: POSITIVE — AB

## 2018-02-05 MED ORDER — AMOXICILLIN 875 MG PO TABS: 875.0000 mg | ORAL_TABLET | Freq: Two times a day (BID) | ORAL | 0 refills | Status: DC

## 2018-02-05 MED ORDER — METHYLPREDNISOLONE ACETATE 80 MG/ML IJ SUSP
80.0000 mg | Freq: Once | INTRAMUSCULAR | Status: AC
  Administered 2018-02-05: 80 mg via INTRAMUSCULAR

## 2018-02-05 MED ORDER — MAGIC MOUTHWASH W/LIDOCAINE: 5.0000 mL | Freq: Three times a day (TID) | ORAL | 0 refills | Status: DC

## 2018-02-05 NOTE — Addendum Note (Signed)
Addended by: Caprice Beaver T on: 02/05/2018 04:26 PM   Modules accepted: Orders

## 2018-02-05 NOTE — Progress Notes (Signed)
Chief Complaint  Patient presents with  . Acute Visit    101.4 last night, x1 day, nausea, sore throat    Subjective:  Joanne Garrett is a 42 y.o. female who presents for a 2 day history of nausea, fever, chills and sore throat. Reports severe pain with swallowing and decreased appetite.  Complains of anterior neck feeling swollen. No difficulty swallowing, breathing or controlling oral secretions. Normal voice.  She has RA and is on methotrexate once weekly.    Denies chills, bodyaches, ear pain, rhinorrhea, nasal congestion, cough, shortness of breath, abdominal pain, V/D.   Treatment to date: Tylenol.  + sick contacts.  No other aggravating or relieving factors.  No other c/o.  ROS as in subjective.   Objective: Vitals:   02/05/18 1439  BP: 122/80  Pulse: 93  Temp: 99.9 F (37.7 C)  SpO2: 96%    General appearance: Alert, WD/WN, no distress, mildly ill appearing                             Skin: warm, no rash, no pallor                           Head: no sinus tenderness                            Eyes: conjunctiva normal, corneas clear, PERRLA                            Ears: pearly TMs, external ear canals normal                          Nose: septum midline, turbinates without edema, erythema or discharge             Mouth/throat: MMM, tongue normal, moderate pharyngeal erythema, exudate present and tonsil edema 3+ bilaterally                           Neck: supple, moderate bilateral anterior cervical adenopathy and tenderness, no thyromegaly                          Heart: RRR, normal S1, S2, no murmurs                         Lungs: CTA bilaterally, no wheezes, rales, or rhonchi      Assessment: Acute streptococcal pharyngitis - Plan: Rapid Strep A, amoxicillin (AMOXIL) 875 MG tablet, magic mouthwash w/lidocaine SOLN, DISCONTINUED: magic mouthwash w/lidocaine SOLN  Sore throat - Plan: Rapid Strep A    Plan: Rapid strep swab positive.  Discussed diagnosis  and treatment of acute strep pharyngitis. Tonsillar edema bilaterally, without signs of abscess. Depo-medrol injection given in office. She did fine with this. Magic mouthwash prescribed and instructions for use provided.  Amoxil prescribed. Spoke with Jeannene Patella, [facility] pharmacist regarding potential antibiotic interaction and she advised there is a low risk potential, level C in regards to amoxil and methotrexate. Ok to treat with amoxil. Monitor for adverse reaction.   She will follow up next week and will go to the ED if any new or worsening symptoms arise specifically difficulty swallowing or breathing.   Work  note given for today and tomorrow. Advised that she should be fever free for 24 hours before returning to work.

## 2018-02-05 NOTE — Patient Instructions (Signed)
Start the antibiotic and if you notice any worsening symptoms then let us know or go to the ED.  If you were to have any difficulty swallowing or breathing then this is an emergency and you should be seen immediately.   Use the mouthwash as needed. Do not eat or drink for 30 minutes following each use.   Return next week for a follow up.

## 2018-02-06 ENCOUNTER — Ambulatory Visit (HOSPITAL_COMMUNITY): Payer: Self-pay | Admitting: Psychiatry

## 2018-02-07 ENCOUNTER — Telehealth: Payer: Self-pay | Admitting: Family Medicine

## 2018-02-07 ENCOUNTER — Encounter: Payer: Self-pay | Admitting: Family Medicine

## 2018-02-07 NOTE — Telephone Encounter (Signed)
Pt called and was still not feeling good was still having a fever, drainage congestion, sore throat, and was not able to go into work today per vickie, was ok to write a work note to return to work on Monday,

## 2018-02-12 ENCOUNTER — Encounter: Payer: Self-pay | Admitting: Family Medicine

## 2018-02-12 ENCOUNTER — Ambulatory Visit (INDEPENDENT_AMBULATORY_CARE_PROVIDER_SITE_OTHER): Payer: BLUE CROSS/BLUE SHIELD | Admitting: Family Medicine

## 2018-02-12 VITALS — BP 110/70 | HR 86 | Temp 98.5°F | Resp 16 | Ht 64.0 in | Wt 196.4 lb

## 2018-02-12 DIAGNOSIS — J020 Streptococcal pharyngitis: Secondary | ICD-10-CM | POA: Diagnosis not present

## 2018-02-12 NOTE — Progress Notes (Signed)
   Subjective:    Patient ID: Joanne Garrett, female    DOB: 1976-10-26, 42 y.o.   MRN: [id number]  HPI Chief Complaint  Patient presents with  . 1 week follow-up    swollen still, throat hurts, tired   She is here to follow-up on recent strep diagnosis and swollen tonsils from February 05, 2018.  She is currently taking Amoxicillin.  Reports feeling much better but still has some pain and fatigue. States this is here first episode of strep and reports only having  a couple of episodes of sore throat per year on average.  Denies ever seeing an ENT.   Denies fever, chills, dysphagia, shortness of breath, N/V/D.   She is currently taking methotrexate for RA and is immunocompromised.   Reviewed allergies, medications, past medical, surgical, family, and social history.   Review of Systems Pertinent positives and negatives in the history of present illness.     Objective:   Physical Exam  Constitutional: She appears well-developed and well-nourished. No distress.  HENT:  Nose: Nose normal.  Mouth/Throat: Uvula is midline and mucous membranes are normal. Posterior oropharyngeal erythema present.  Mild tonsillar erythema and edema resolving. Tonsils 2+ bilaterally. No exudate.   Eyes: Pupils are equal, round, and reactive to light. Conjunctivae are normal.  Neck: Normal range of motion. Neck supple.  Skin: Skin is warm and dry.   BP 110/70   Pulse 86   Temp 98.5 F (36.9 C) (Oral)   Resp 16   Ht 5\' 4"  (1.626 m)   Wt 196 lb 6.4 oz (89.1 kg)   LMP 06/11/2013   SpO2 96%   BMI 33.71 kg/m       Assessment & Plan:  Acute streptococcal pharyngitis  She appears to be improving and will complete antibiotic.  No obvious side effects.  She will follow-up if she is not continuing to improve or if she has a setback.

## 2018-02-16 ENCOUNTER — Other Ambulatory Visit: Payer: Self-pay | Admitting: Family Medicine

## 2018-02-18 ENCOUNTER — Other Ambulatory Visit (HOSPITAL_COMMUNITY): Payer: Self-pay | Admitting: Psychiatry

## 2018-02-18 MED ORDER — VENLAFAXINE HCL ER 75 MG PO CP24: 75.0000 mg | ORAL_CAPSULE | Freq: Every day | ORAL | 0 refills | Status: DC

## 2018-02-24 ENCOUNTER — Ambulatory Visit
Admission: RE | Admit: 2018-02-24 | Discharge: 2018-02-24 | Disposition: A | Discharge status: Home / Self Care | Payer: BLUE CROSS/BLUE SHIELD | Source: Ambulatory Visit | Attending: Family Medicine | Admitting: Family Medicine

## 2018-02-24 DIAGNOSIS — Z1231 Encounter for screening mammogram for malignant neoplasm of breast: Secondary | ICD-10-CM | POA: Diagnosis not present

## 2018-02-24 DIAGNOSIS — Z853 Personal history of malignant neoplasm of breast: Secondary | ICD-10-CM
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
## 2018-03-12 ENCOUNTER — Ambulatory Visit (INDEPENDENT_AMBULATORY_CARE_PROVIDER_SITE_OTHER): Payer: BLUE CROSS/BLUE SHIELD | Admitting: Psychiatry

## 2018-03-12 ENCOUNTER — Encounter (HOSPITAL_COMMUNITY): Payer: Self-pay | Admitting: Psychiatry

## 2018-03-12 VITALS — BP 123/86 | HR 62 | Ht 64.0 in | Wt 193.0 lb

## 2018-03-12 DIAGNOSIS — Z810 Family history of intellectual disabilities: Secondary | ICD-10-CM | POA: Diagnosis not present

## 2018-03-12 DIAGNOSIS — F331 Major depressive disorder, recurrent, moderate: Secondary | ICD-10-CM

## 2018-03-12 DIAGNOSIS — Z811 Family history of alcohol abuse and dependence: Secondary | ICD-10-CM | POA: Diagnosis not present

## 2018-03-12 DIAGNOSIS — Z818 Family history of other mental and behavioral disorders: Secondary | ICD-10-CM | POA: Diagnosis not present

## 2018-03-12 DIAGNOSIS — F4310 Post-traumatic stress disorder, unspecified: Secondary | ICD-10-CM

## 2018-03-12 DIAGNOSIS — R450 Nervousness: Secondary | ICD-10-CM

## 2018-03-12 DIAGNOSIS — F419 Anxiety disorder, unspecified: Secondary | ICD-10-CM | POA: Diagnosis not present

## 2018-03-12 MED ORDER — LORAZEPAM 0.5 MG PO TABS: ORAL_TABLET | ORAL | 0 refills | Status: DC

## 2018-03-12 MED ORDER — VENLAFAXINE HCL ER 150 MG PO CP24: 150.0000 mg | ORAL_CAPSULE | Freq: Every day | ORAL | 1 refills | Status: DC

## 2018-03-12 NOTE — Patient Instructions (Signed)
1. Increase venlafaxine 150 mg daily  2. Continue ativan 0.25-0.5 mg daily as needed for anxiety  3. Return to clinic in two month for 30 mins

## 2018-03-17 ENCOUNTER — Ambulatory Visit (HOSPITAL_COMMUNITY)
Admission: EM | Admit: 2018-03-17 | Discharge: 2018-03-17 | Disposition: A | Discharge status: Home / Self Care | Payer: BLUE CROSS/BLUE SHIELD | Attending: Family Medicine | Admitting: Family Medicine

## 2018-03-17 ENCOUNTER — Encounter (HOSPITAL_COMMUNITY): Payer: Self-pay | Admitting: Emergency Medicine

## 2018-03-17 DIAGNOSIS — Z923 Personal history of irradiation: Secondary | ICD-10-CM | POA: Diagnosis not present

## 2018-03-17 DIAGNOSIS — F419 Anxiety disorder, unspecified: Secondary | ICD-10-CM | POA: Diagnosis not present

## 2018-03-17 DIAGNOSIS — N200 Calculus of kidney: Secondary | ICD-10-CM | POA: Diagnosis not present

## 2018-03-17 DIAGNOSIS — Z9889 Other specified postprocedural states: Secondary | ICD-10-CM

## 2018-03-17 DIAGNOSIS — Z853 Personal history of malignant neoplasm of breast: Secondary | ICD-10-CM | POA: Diagnosis not present

## 2018-03-17 DIAGNOSIS — Z87442 Personal history of urinary calculi: Secondary | ICD-10-CM | POA: Diagnosis not present

## 2018-03-17 DIAGNOSIS — R3915 Urgency of urination: Secondary | ICD-10-CM | POA: Insufficient documentation

## 2018-03-17 DIAGNOSIS — R350 Frequency of micturition: Secondary | ICD-10-CM | POA: Diagnosis not present

## 2018-03-17 DIAGNOSIS — Z882 Allergy status to sulfonamides status: Secondary | ICD-10-CM | POA: Insufficient documentation

## 2018-03-17 DIAGNOSIS — N23 Unspecified renal colic: Secondary | ICD-10-CM | POA: Insufficient documentation

## 2018-03-17 DIAGNOSIS — E0590 Thyrotoxicosis, unspecified without thyrotoxic crisis or storm: Secondary | ICD-10-CM | POA: Diagnosis not present

## 2018-03-17 DIAGNOSIS — R319 Hematuria, unspecified: Secondary | ICD-10-CM

## 2018-03-17 DIAGNOSIS — Z90710 Acquired absence of both cervix and uterus: Secondary | ICD-10-CM | POA: Insufficient documentation

## 2018-03-17 DIAGNOSIS — M545 Low back pain, unspecified: Secondary | ICD-10-CM

## 2018-03-17 DIAGNOSIS — Z79899 Other long term (current) drug therapy: Secondary | ICD-10-CM | POA: Diagnosis not present

## 2018-03-17 DIAGNOSIS — M069 Rheumatoid arthritis, unspecified: Secondary | ICD-10-CM | POA: Insufficient documentation

## 2018-03-17 DIAGNOSIS — I10 Essential (primary) hypertension: Secondary | ICD-10-CM | POA: Diagnosis not present

## 2018-03-17 DIAGNOSIS — F329 Major depressive disorder, single episode, unspecified: Secondary | ICD-10-CM | POA: Diagnosis not present

## 2018-03-17 DIAGNOSIS — Z9111 Patient's noncompliance with dietary regimen: Secondary | ICD-10-CM | POA: Diagnosis not present

## 2018-03-17 LAB — POCT URINALYSIS DIP (DEVICE)
Bilirubin Urine: NEGATIVE
Glucose, UA: NEGATIVE mg/dL
Ketones, ur: NEGATIVE mg/dL
Nitrite: NEGATIVE
Protein, ur: NEGATIVE mg/dL
Specific Gravity, Urine: 1.015 (ref 1.005–1.030)
Urobilinogen, UA: 0.2 mg/dL (ref 0.0–1.0)
pH: 7 (ref 5.0–8.0)

## 2018-03-17 MED ORDER — TAMSULOSIN HCL 0.4 MG PO CAPS: 0.4000 mg | ORAL_CAPSULE | Freq: Every day | ORAL | 0 refills | Status: DC

## 2018-03-17 MED ORDER — HYDROCODONE-ACETAMINOPHEN 5-325 MG PO TABS: 2.0000 | ORAL_TABLET | Freq: Four times a day (QID) | ORAL | 0 refills | Status: DC | PRN

## 2018-03-17 NOTE — ED Provider Notes (Signed)
MRN: [id number] DOB: Mar 29, 1976  Subjective:   Joanne Garrett is a 42 y.o. female presenting for 2 day history of urinary frequency, urinary urgency and low back pain, nausea and right sided pelvic pain. Has tried hydrating with minimal relief. Patient reports history of renal stones, hematuria, had lithotripsy in 09/2018. She has a urologist, is scheduled to see a nephrologist. Denies fever, dysuria, hematuria, flank pain, abdominal pain, cloudy malordorous urine, genital rash and vaginal discharge, vomiting. She has a history of HTN, admits dietary non-compliance recently.  No current facility-administered medications for this encounter.   Current Outpatient Medications:  .  amLODipine (NORVASC) 10 MG tablet, Take 1 tablet (10 mg total) by mouth daily., Disp: 30 tablet, Rfl: 2 .  amLODipine (NORVASC) 10 MG tablet, TAKE 1 TABLET BY MOUTH DAILY, Disp: 30 tablet, Rfl: 5 .  amoxicillin (AMOXIL) 875 MG tablet, Take 1 tablet (875 mg total) by mouth 2 (two) times daily., Disp: 20 tablet, Rfl: 0 .  LORazepam (ATIVAN) 0.5 MG tablet, 0.25-0.5 mg daily as needed for anxiety, Disp: 30 tablet, Rfl: 0 .  magic mouthwash w/lidocaine SOLN, Take 5 mLs by mouth 3 (three) times daily., Disp: 120 mL, Rfl: 0 .  methotrexate (50 MG/ML) 1 G injection, Inject 40 mg into the vein once a week. , Disp: , Rfl:  .  Multiple Vitamin (MULTIVITAMIN) tablet, Take 1 tablet by mouth daily., Disp: , Rfl:  .  oxybutynin (DITROPAN) 5 MG tablet, TK 1 T PO Q 8 H PRF BLADDER SPASM, Disp: , Rfl: 11 .  valACYclovir (VALTREX) 1000 MG tablet, Take 1 tablet (1,000 mg total) by mouth 2 (two) times daily. X 5 days during an outbreak (Patient taking differently: Take 1,000 mg by mouth 2 (two) times daily as needed. X 5 days during an outbreak), Disp: 30 tablet, Rfl: 3 .  venlafaxine XR (EFFEXOR-XR) 150 MG 24 hr capsule, Take 1 capsule (150 mg total) by mouth daily., Disp: 30 capsule, Rfl: 1   Allergies  Allergen Reactions  . Shellfish  Allergy Itching and Other (See Comments)    HIVES IF GOES OVERBOARD, HAS HAD IV DYE AND HAD NO PROBLEMS  . Bactrim [Sulfamethoxazole-Trimethoprim] Hives and Other (See Comments)  . Chocolate Flavor Itching and Other (See Comments)    PLAIN CHOCOLATE IN ABUNDANCE    Past Medical History:  Diagnosis Date  . Anxiety   . 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 Depression   . Genital herpes   . Hematuria   . History of hyperthyroidism 02/2001   s/p  RAI 03-2001  . History of panic attacks   . History of radiation therapy    completed 09-23-2012  50Gy in 25 fractions and boost completed [phone]  . History of thyroid nodule   . Hypertension   . Renal calculus, right   . Rheumatoid arthritis ([place])   . Urgency of urination      Past Surgical History:  Procedure Laterality Date  . ANTERIOR CERVICAL DECOMP/DISCECTOMY FUSION  05-18-2015   [facility]  . BREAST LUMPECTOMY Left 07-03-2012    [facility]  . CYSTOSCOPY WITH RETROGRADE PYELOGRAM, URETEROSCOPY AND STENT PLACEMENT Right 09/09/2017   Procedure: CYSTOSCOPY WITH RIGHT RETROGRADE PYELOGRAM, URETEROSCOPY , STONE BASKETRY AND STENT PLACEMENT;  Surgeon: Lucas Mallow, MD;  Location[facility];  Service: Urology;  Laterality: Right;  .  KNEE ARTHROSCOPY Left 2009  . TOTAL ABDOMINAL HYSTERECTOMY W/ BILATERAL SALPINGOOPHORECTOMY  2014   Objective:   Vitals: BP (!) 143/100   Pulse 84   Temp 98.7 F (37.1 C)   Resp 18   LMP 06/11/2013   SpO2 100%   Physical Exam  Constitutional: She is oriented to person, place, and time. She appears well-developed and well-nourished.  HENT:  Mouth/Throat: Oropharynx is clear and moist.  Eyes: Right eye exhibits no discharge. Left eye exhibits no discharge.  Neck: Normal range of motion. Neck supple.  Cardiovascular: Normal rate,  regular rhythm and intact distal pulses. Exam reveals no gallop and no friction rub.  No murmur heard. Pulmonary/Chest: No respiratory distress. She has no wheezes. She has no rales.  Abdominal: Soft. Bowel sounds are normal. She exhibits no distension and no mass. There is tenderness (right pelvic). There is no rebound and no guarding.  No CVA tenderness.  Lymphadenopathy:    She has no cervical adenopathy.  Neurological: She is alert and oriented to person, place, and time.  Skin: Skin is warm and dry.  Psychiatric: She has a normal mood and affect.   Results for orders placed or performed during the hospital encounter of 03/17/18 (from the past 24 hour(s))  POCT urinalysis dip (device)     Status: Abnormal   Collection Time: 03/17/18  7:55 PM  Result Value Ref Range   Glucose, UA NEGATIVE NEGATIVE mg/dL   Bilirubin Urine NEGATIVE NEGATIVE   Ketones, ur NEGATIVE NEGATIVE mg/dL   Specific Gravity, Urine 1.015 1.005 - 1.030   Hgb urine dipstick TRACE (A) NEGATIVE   pH 7.0 5.0 - 8.0   Protein, ur NEGATIVE NEGATIVE mg/dL   Urobilinogen, UA 0.2 0.0 - 1.0 mg/dL   Nitrite NEGATIVE NEGATIVE   Leukocytes, UA TRACE (A) NEGATIVE   Assessment and Plan :   Renal stone  Urinary frequency  Urinary urgency  Acute bilateral low back pain without sciatica  Hematuria, unspecified type  Essential hypertension  Renal colic  History of lithotripsy  Patient is likely experiencing renal colic, urine culture pending. Will start patient on Flomax which she has taken before without any issues. Use hydrocodone for severe pain. Patient will contact her urologist for recheck. I offered her a work note but she refused this.    Jaynee Eagles, [place] 03/17/18 2035

## 2018-03-17 NOTE — ED Triage Notes (Signed)
Pt c/o urinary frequency since Saturday.

## 2018-03-17 NOTE — ED Notes (Signed)
Pt discharged by provider.

## 2018-03-17 NOTE — Discharge Instructions (Addendum)
You may take 500mg  Tylenol every 6 hours for pain and inflammation. Use hydrocodone for breakthrough or severe pain. Hydrate well with at least 2 liters (1 gallon) of water daily.

## 2018-03-19 LAB — URINE CULTURE

## 2018-03-21 DIAGNOSIS — R1031 Right lower quadrant pain: Secondary | ICD-10-CM | POA: Diagnosis not present

## 2018-03-21 DIAGNOSIS — R8271 Bacteriuria: Secondary | ICD-10-CM | POA: Diagnosis not present

## 2018-03-24 ENCOUNTER — Encounter: Payer: Self-pay | Admitting: Family Medicine

## 2018-03-24 ENCOUNTER — Ambulatory Visit (INDEPENDENT_AMBULATORY_CARE_PROVIDER_SITE_OTHER): Payer: BLUE CROSS/BLUE SHIELD | Admitting: Family Medicine

## 2018-03-24 ENCOUNTER — Ambulatory Visit (HOSPITAL_COMMUNITY)
Admission: RE | Admit: 2018-03-24 | Discharge: 2018-03-24 | Disposition: A | Discharge status: Home / Self Care | Payer: BLUE CROSS/BLUE SHIELD | Source: Ambulatory Visit | Attending: Family Medicine | Admitting: Family Medicine

## 2018-03-24 VITALS — BP 130/90 | HR 89 | Temp 98.4°F | Resp 16 | Ht 63.5 in | Wt 198.2 lb

## 2018-03-24 DIAGNOSIS — R270 Ataxia, unspecified: Secondary | ICD-10-CM

## 2018-03-24 DIAGNOSIS — R410 Disorientation, unspecified: Secondary | ICD-10-CM

## 2018-03-24 DIAGNOSIS — H538 Other visual disturbances: Secondary | ICD-10-CM

## 2018-03-24 DIAGNOSIS — H81399 Other peripheral vertigo, unspecified ear: Secondary | ICD-10-CM

## 2018-03-24 DIAGNOSIS — R319 Hematuria, unspecified: Secondary | ICD-10-CM

## 2018-03-24 DIAGNOSIS — Z8679 Personal history of other diseases of the circulatory system: Secondary | ICD-10-CM | POA: Insufficient documentation

## 2018-03-24 DIAGNOSIS — R42 Dizziness and giddiness: Secondary | ICD-10-CM | POA: Diagnosis not present

## 2018-03-24 DIAGNOSIS — Z9889 Other specified postprocedural states: Secondary | ICD-10-CM | POA: Insufficient documentation

## 2018-03-24 DIAGNOSIS — W19XXXA Unspecified fall, initial encounter: Secondary | ICD-10-CM

## 2018-03-24 DIAGNOSIS — Z853 Personal history of malignant neoplasm of breast: Secondary | ICD-10-CM | POA: Diagnosis not present

## 2018-03-24 LAB — POCT URINALYSIS DIP (PROADVANTAGE DEVICE)
Bilirubin, UA: NEGATIVE
Glucose, UA: NEGATIVE mg/dL
Ketones, POC UA: NEGATIVE mg/dL
Leukocytes, UA: NEGATIVE
Nitrite, UA: NEGATIVE
Protein Ur, POC: NEGATIVE mg/dL
Specific Gravity, Urine: 1.015
Urobilinogen, Ur: NEGATIVE
pH, UA: 6 (ref 5.0–8.0)

## 2018-03-24 LAB — GLUCOSE, POCT (MANUAL RESULT ENTRY): POC Glucose: 85 mg/dl (ref 70–99)

## 2018-03-24 MED ORDER — MECLIZINE HCL 25 MG PO TABS: 25.0000 mg | ORAL_TABLET | Freq: Two times a day (BID) | ORAL | 0 refills | Status: DC | PRN

## 2018-03-24 NOTE — Progress Notes (Signed)
Subjective:    Patient ID: Joanne Garrett, female    DOB: May 02, 1976, 42 y.o.   MRN: [id number]  HPI Chief Complaint  Patient presents with  . dizzy    lightheadedness that comes and goes, got dizzy and fell last wednesday.    She is here with complaints of a 4-week history of intermittent dizziness that she describes as lightheadedness. Reports having dizziness that lasts half a day to a whole day and occurring 3-4 days per week. States dizziness does not seem to be worse with position changes.    States she fell due to being lightheaded last Wednesday and injured her right elbow. Denies LOC.  Denies hitting her head.  Denies neck or back pain.  Reports scraping her right elbow and she has a bandage on this today.  She is up-to-date with Tdap.  Denies any difficulty with range of motion with right elbow.   States dizziness seems to be worsening. She often feels unsteady on her feet.   She also reports having moments of confusion over the past few days as well. States this morning she put something in the microwave to cook and never turned it on. She realized this much later. She is concerned about memory issues and intermittent confusion.   She also reports blurry vision and nausea associated with dizziness. Denies double vision. No loss of vision.   Denies fever, chills, sore throat, cough, chest pain, palpitations, shortness of breath, vomiting, diarrhea, constipation, abdominal pain, urinary symptoms, vaginal discharge.  History of hysterectomy.   Has not eaten today due to loss of appetite and her BS is 85 in the office.   States she went to UC last week with pain thinking she had a kidney stone. She was prescribed Flomax at the [facility] but states she has not taken it and does not plan on it.  She saw her urologist last Friday. States her urine was normal in the office with a catherization. States she also had an [place] and XR in the office and was given a Toradol injection for right flank  pain and suprapubic pressure. States this did improve her symptoms.   Has appt for thyroid US tomorrow. History of thyroid nodules.   Past Medical History:  Diagnosis Date  . Anxiety   . 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 Depression   . Genital herpes   . Hematuria   . History of hyperthyroidism 02/2001   s/p  RAI 03-2001  . History of panic attacks   . History of radiation therapy    completed 09-23-2012  50Gy in 25 fractions and boost completed [id number]  . History of thyroid nodule   . Hypertension   . Renal calculus, right   . Rheumatoid arthritis ([place])   . Urgency of urination    Past Surgical History:  Procedure Laterality Date  . ANTERIOR CERVICAL DECOMP/DISCECTOMY FUSION  05-18-2015   [facility]  . BREAST LUMPECTOMY Left 07-03-2012    [facility]  . CYSTOSCOPY WITH RETROGRADE PYELOGRAM, URETEROSCOPY AND STENT PLACEMENT Right 09/09/2017   Procedure: CYSTOSCOPY WITH RIGHT RETROGRADE PYELOGRAM, URETEROSCOPY , STONE BASKETRY AND STENT PLACEMENT;  Surgeon: Lucas Mallow, MD;  Location: [facility];  Service: Urology;  Laterality: Right;  . KNEE ARTHROSCOPY Left 2009  . TOTAL ABDOMINAL HYSTERECTOMY W/ BILATERAL SALPINGOOPHORECTOMY  2014  Outpatient Encounter Medications as of 03/24/2018  Medication Sig Note  . LORazepam (ATIVAN) 0.5 MG tablet 0.25-0.5 mg daily as needed for anxiety   . methotrexate (50 MG/ML) 1 G injection Inject 40 mg into the vein once a week.  08/30/2014: Thursday  . Multiple Vitamin (MULTIVITAMIN) tablet Take 1 tablet by mouth daily.   Marland Kitchen oxybutynin (DITROPAN) 5 MG tablet TK 1 T PO Q 8 H PRF BLADDER SPASM   . valACYclovir (VALTREX) 1000 MG tablet Take 1 tablet (1,000 mg total) by mouth 2 (two) times daily. X 5 days during an outbreak (Patient taking differently: Take 1,000 mg by mouth 2 (two)  times daily as needed. X 5 days during an outbreak)   . venlafaxine XR (EFFEXOR-XR) 150 MG 24 hr capsule Take 1 capsule (150 mg total) by mouth daily.   . [DISCONTINUED] amLODipine (NORVASC) 10 MG tablet Take 1 tablet (10 mg total) by mouth daily.   . [DISCONTINUED] tamsulosin (FLOMAX) 0.4 MG CAPS capsule Take 1 capsule (0.4 mg total) by mouth daily. 03/24/2018: has not taken  . amLODipine (NORVASC) 10 MG tablet TAKE 1 TABLET BY MOUTH DAILY   . meclizine (ANTIVERT) 25 MG tablet Take 1 tablet (25 mg total) by mouth 2 (two) times daily as needed for dizziness.   . [DISCONTINUED] amoxicillin (AMOXIL) 875 MG tablet Take 1 tablet (875 mg total) by mouth 2 (two) times daily.   . [DISCONTINUED] HYDROcodone-acetaminophen (NORCO/VICODIN) 5-325 MG tablet Take 2 tablets by mouth every 6 (six) hours as needed for severe pain.   . [DISCONTINUED] magic mouthwash w/lidocaine SOLN Take 5 mLs by mouth 3 (three) times daily.    No facility-administered encounter medications on file as of 03/24/2018.    Reviewed allergies, medications, past medical, surgical, family, and social history.    Review of Systems Pertinent positives and negatives in the history of present illness.     Objective:   Physical Exam  Constitutional: She is oriented to person, place, and time. She appears well-developed and well-nourished. She does not have a sickly appearance. No distress.  HENT:  Right Ear: Hearing, tympanic membrane and ear canal normal.  Left Ear: Hearing, tympanic membrane and ear canal normal.  Nose: Nose normal.  Mouth/Throat: Uvula is midline, oropharynx is clear and moist and mucous membranes are normal.  Eyes: Pupils are equal, round, and reactive to light. Conjunctivae, EOM and lids are normal. Right eye exhibits no nystagmus. Left eye exhibits no nystagmus.  Neck: Trachea normal, full passive range of motion without pain and phonation normal. Neck supple. No JVD present.  Cardiovascular: Normal rate,  regular rhythm, normal heart sounds, intact distal pulses and normal pulses. Exam reveals no gallop and no friction rub.  No murmur heard. No LE edema   Pulmonary/Chest: Effort normal and breath sounds normal.  Musculoskeletal:       Right elbow: She exhibits normal range of motion and no swelling.  Band-Aid in place  Neurological: She is alert and oriented to person, place, and time. She has normal strength and normal reflexes. No cranial nerve deficit or sensory deficit. She displays a negative Romberg sign. Gait abnormal. GCS eye subscore is 4. GCS verbal subscore is 5. GCS motor subscore is 6.  Cautious with gait and movement which may be affecting her normal motor function  No facial asymmetry, no pronator drift, normal finger to nose and heel to shin.    Skin: Skin is warm and dry. Capillary refill takes less than 2 seconds. No rash  noted.  Psychiatric: She has a normal mood and affect. Her speech is normal and behavior is normal. Judgment and thought content normal. Cognition and memory are normal.   BP 130/90 (BP Location: Right Arm, Patient Position: Standing, Cuff Size: Large)   Pulse 89   Temp 98.4 F (36.9 C) (Oral)   Resp 16   Ht 5' 3.5" (1.613 m)   Wt 198 lb 3.2 oz (89.9 kg)   LMP 06/11/2013   SpO2 98%   BMI 34.56 kg/m     PCOT glucose is 85    Assessment & Plan:  Dizziness - Plan: CBC with Differential/Platelet, Comprehensive metabolic panel, POCT glucose (manual entry), POCT Urinalysis DIP (Proadvantage Device), TSH, MR Brain Wo Contrast  Intermittent confusion - Plan: CBC with Differential/Platelet, Comprehensive metabolic panel, POCT glucose (manual entry), POCT Urinalysis DIP (Proadvantage Device), TSH, MR Brain Wo Contrast  Blurred vision, bilateral - Plan: POCT glucose (manual entry), MR Brain Wo Contrast  Other peripheral vertigo, unspecified ear - Plan: MR Brain Wo Contrast  Ataxia - Plan: CBC with Differential/Platelet, Comprehensive metabolic panel,  POCT glucose (manual entry), TSH, MR Brain Wo Contrast  Fall, initial encounter  Hematuria, unspecified type - Plan: Urine Culture  Glucose fingerstick 85. Orthostatic vitals done and she is not postural. Discussed that her neurological exam is pretty unremarkable however she does appear to be very cautious with ambulation therefore affecting her gait somewhat.  Suspect dizziness may be related to vertigo as she did have more difficulty when changing positions. Discuss trying meclizine to see if this would help her symptoms.  She is very concerned that she has been having issues with confusion and would prefer to have a MRI today.  I think this is reasonable given all of her symptoms.  Urinalysis dipstick 2+ blood. Urine culture ordered.  Asymptomatic but does have a history of renal stones.  She reports having a negative urinalysis dipstick at the urologist last week.  We will refer her back to urology if hematuria persists.  Follow-up pending labs and MRI results. Spent 40 minutes or longer face-to-face with patient and at least 50% was in counseling and coordination of care.

## 2018-03-24 NOTE — Patient Instructions (Addendum)
We will get the MRI of your brain. You may also want to try the meclizine and see if this helps.  It may be sedating so do not take this and drive.   If you notice any new or worsening symptoms such as weakness you will need to go to the emergency department or call 911.

## 2018-03-25 ENCOUNTER — Ambulatory Visit
Admission: RE | Admit: 2018-03-25 | Discharge: 2018-03-25 | Disposition: A | Discharge status: Home / Self Care | Payer: BLUE CROSS/BLUE SHIELD | Source: Ambulatory Visit | Attending: Family Medicine | Admitting: Family Medicine

## 2018-03-25 ENCOUNTER — Other Ambulatory Visit: Payer: Self-pay

## 2018-03-25 DIAGNOSIS — Z8639 Personal history of other endocrine, nutritional and metabolic disease: Secondary | ICD-10-CM

## 2018-03-25 DIAGNOSIS — E041 Nontoxic single thyroid nodule: Secondary | ICD-10-CM | POA: Diagnosis not present

## 2018-03-25 LAB — COMPREHENSIVE METABOLIC PANEL
ALT: 21 IU/L (ref 0–32)
AST: 23 IU/L (ref 0–40)
Albumin/Globulin Ratio: 1.6 (ref 1.2–2.2)
Albumin: 4.6 g/dL (ref 3.5–5.5)
Alkaline Phosphatase: 82 IU/L (ref 39–117)
BUN/Creatinine Ratio: 21 (ref 9–23)
BUN: 14 mg/dL (ref 6–24)
Bilirubin Total: 0.2 mg/dL (ref 0.0–1.2)
CO2: 25 mmol/L (ref 20–29)
Calcium: 9.6 mg/dL (ref 8.7–10.2)
Chloride: 104 mmol/L (ref 96–106)
Creatinine, Ser: 0.68 mg/dL (ref 0.57–1.00)
GFR calc Af Amer: 124 mL/min/{1.73_m2} (ref >59)
GFR calc non Af Amer: 107 mL/min/{1.73_m2} (ref >59)
Globulin, Total: 2.9 g/dL (ref 1.5–4.5)
Glucose: 77 mg/dL (ref 65–99)
Potassium: 3.6 mmol/L (ref 3.5–5.2)
Sodium: 144 mmol/L (ref 134–144)
Total Protein: 7.5 g/dL (ref 6.0–8.5)

## 2018-03-25 LAB — TSH: TSH: 0.712 u[IU]/mL (ref 0.450–4.500)

## 2018-03-25 LAB — URINE CULTURE

## 2018-03-25 LAB — CBC WITH DIFFERENTIAL/PLATELET
Basophils Absolute: 0 10*3/uL (ref 0.0–0.2)
Basos: 1 %
EOS (ABSOLUTE): 0.1 10*3/uL (ref 0.0–0.4)
Eos: 2 %
Hematocrit: 37.3 % (ref 34.0–46.6)
Hemoglobin: 12 g/dL (ref 11.1–15.9)
Immature Grans (Abs): 0 10*3/uL (ref 0.0–0.1)
Immature Granulocytes: 0 %
Lymphocytes Absolute: 1.9 10*3/uL (ref 0.7–3.1)
Lymphs: 30 %
MCH: 26.7 pg (ref 26.6–33.0)
MCHC: 32.2 g/dL (ref 31.5–35.7)
MCV: 83 fL (ref 79–97)
Monocytes Absolute: 0.7 10*3/uL (ref 0.1–0.9)
Monocytes: 11 %
Neutrophils Absolute: 3.6 10*3/uL (ref 1.4–7.0)
Neutrophils: 56 %
Platelets: 237 10*3/uL (ref 150–450)
RBC: 4.49 x10E6/uL (ref 3.77–5.28)
RDW: 16 % — ABNORMAL HIGH (ref 12.3–15.4)
WBC: 6.3 10*3/uL (ref 3.4–10.8)

## 2018-04-11 ENCOUNTER — Other Ambulatory Visit: Payer: Self-pay | Admitting: Urgent Care

## 2018-05-02 DIAGNOSIS — M0579 Rheumatoid arthritis with rheumatoid factor of multiple sites without organ or systems involvement: Secondary | ICD-10-CM | POA: Diagnosis not present

## 2018-05-02 DIAGNOSIS — Z79899 Other long term (current) drug therapy: Secondary | ICD-10-CM | POA: Diagnosis not present
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
## 2018-05-12 ENCOUNTER — Ambulatory Visit (HOSPITAL_COMMUNITY): Payer: BLUE CROSS/BLUE SHIELD | Admitting: Psychiatry

## 2018-05-20 ENCOUNTER — Ambulatory Visit: Payer: BLUE CROSS/BLUE SHIELD | Admitting: Medical

## 2018-06-02 ENCOUNTER — Telehealth (HOSPITAL_COMMUNITY): Payer: Self-pay | Admitting: Psychiatry

## 2018-06-02 MED ORDER — VENLAFAXINE HCL ER 150 MG PO CP24: 150.0000 mg | ORAL_CAPSULE | Freq: Every day | ORAL | 0 refills | Status: DC

## 2018-06-02 NOTE — Telephone Encounter (Signed)
Received refill request for venlafaxine. Ordered for a month. Please contact the patient to make follow up. I will not be able to do refill anymore without evaluation.

## 2018-06-02 NOTE — Telephone Encounter (Signed)
LVM per provider: venlafaxine. Ordered for a month. Informed  patient to make follow up. Provider unable to do refills   without evaluation

## 2018-06-18 DIAGNOSIS — R198 Other specified symptoms and signs involving the digestive system and abdomen: Secondary | ICD-10-CM | POA: Diagnosis not present

## 2018-06-18 DIAGNOSIS — R1031 Right lower quadrant pain: Secondary | ICD-10-CM | POA: Diagnosis not present

## 2018-06-18 DIAGNOSIS — R110 Nausea: Secondary | ICD-10-CM | POA: Diagnosis not present

## 2018-06-18 DIAGNOSIS — K625 Hemorrhage of anus and rectum: Secondary | ICD-10-CM | POA: Diagnosis not present

## 2018-06-26 ENCOUNTER — Ambulatory Visit (HOSPITAL_COMMUNITY): Payer: BLUE CROSS/BLUE SHIELD | Admitting: Psychiatry
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
## 2018-06-27 DIAGNOSIS — K2930 Chronic superficial gastritis without bleeding: Secondary | ICD-10-CM | POA: Diagnosis not present

## 2018-06-27 DIAGNOSIS — K625 Hemorrhage of anus and rectum: Secondary | ICD-10-CM | POA: Diagnosis not present

## 2018-06-27 DIAGNOSIS — R1031 Right lower quadrant pain: Secondary | ICD-10-CM | POA: Diagnosis not present

## 2018-06-27 DIAGNOSIS — R110 Nausea: Secondary | ICD-10-CM | POA: Diagnosis not present

## 2018-06-27 DIAGNOSIS — K2980 Duodenitis without bleeding: Secondary | ICD-10-CM | POA: Diagnosis not present

## 2018-06-27 DIAGNOSIS — K3189 Other diseases of stomach and duodenum: Secondary | ICD-10-CM | POA: Diagnosis not present

## 2018-07-02 DIAGNOSIS — K2980 Duodenitis without bleeding: Secondary | ICD-10-CM | POA: Diagnosis not present

## 2018-07-02 DIAGNOSIS — K2930 Chronic superficial gastritis without bleeding: Secondary | ICD-10-CM | POA: Diagnosis not present

## 2018-07-24 ENCOUNTER — Encounter (HOSPITAL_COMMUNITY): Payer: Self-pay | Admitting: Psychiatry

## 2018-07-24 ENCOUNTER — Ambulatory Visit (INDEPENDENT_AMBULATORY_CARE_PROVIDER_SITE_OTHER): Payer: BLUE CROSS/BLUE SHIELD | Admitting: Psychiatry

## 2018-07-24 VITALS — BP 124/88 | HR 83 | Ht 63.25 in | Wt 201.0 lb

## 2018-07-24 DIAGNOSIS — F331 Major depressive disorder, recurrent, moderate: Secondary | ICD-10-CM

## 2018-07-24 MED ORDER — VENLAFAXINE HCL ER 150 MG PO CP24: 150.0000 mg | ORAL_CAPSULE | Freq: Every day | ORAL | 0 refills | Status: DC

## 2018-07-24 MED ORDER — LORAZEPAM 0.5 MG PO TABS: ORAL_TABLET | ORAL | 2 refills | Status: DC
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
## 2018-07-24 NOTE — Patient Instructions (Signed)
1. Continue venlafaxine 150 mg daily  2. Continue ativan 0.25-0.5 mg daily as needed for anxiety  3.Return to clinic inthree months for 15 mins 

## 2018-08-11 ENCOUNTER — Ambulatory Visit (HOSPITAL_COMMUNITY): Payer: BLUE CROSS/BLUE SHIELD | Admitting: Psychiatry

## 2018-08-11 ENCOUNTER — Telehealth: Payer: Self-pay | Admitting: Family Medicine

## 2018-08-11 NOTE — Telephone Encounter (Signed)
Please handle this. She already has an appointment apparently.

## 2018-08-11 NOTE — Telephone Encounter (Signed)
I have faxed over referral to [facility]

## 2018-08-11 NOTE — Telephone Encounter (Signed)
Pt requesting referral to ENT, Dr. Constance Holster, for her appointment with him for tomorrow at 10am. She continues to struggle with vertigo and sharpe pain in ear that is causing the corresponding side of her face to hurt & be sensitive. Pt is at work today and having to wear sunshades to cope with symptoms. She can be reached at [phone]

## 2018-08-12 DIAGNOSIS — M542 Cervicalgia: Secondary | ICD-10-CM | POA: Insufficient documentation

## 2018-08-12 DIAGNOSIS — H9201 Otalgia, right ear: Secondary | ICD-10-CM | POA: Diagnosis not present

## 2018-08-15 ENCOUNTER — Other Ambulatory Visit: Payer: Self-pay | Admitting: Family Medicine

## 2018-08-15 NOTE — Telephone Encounter (Signed)
Is this okay to refill? 

## 2018-08-18 ENCOUNTER — Telehealth: Payer: Self-pay | Admitting: Family Medicine

## 2018-08-18 NOTE — Telephone Encounter (Signed)
   Saw Dr. Constance Holster for R ear pain and vertigo and per patient, He was no help. He told her that he did not see an infection or anything  She doesn't know what else to do, ? Second opinion  Please call

## 2018-08-18 NOTE — Telephone Encounter (Signed)
Looks like I saw her last in May? She should come in to be seen

## 2018-08-18 NOTE — Telephone Encounter (Signed)
Pt coming in tomorrow

## 2018-08-19 ENCOUNTER — Encounter: Payer: Self-pay | Admitting: Family Medicine

## 2018-08-19 ENCOUNTER — Ambulatory Visit (INDEPENDENT_AMBULATORY_CARE_PROVIDER_SITE_OTHER): Payer: BLUE CROSS/BLUE SHIELD | Admitting: Family Medicine

## 2018-08-19 VITALS — BP 120/84 | HR 76 | Temp 98.6°F | Resp 16 | Wt 205.6 lb

## 2018-08-19 DIAGNOSIS — R42 Dizziness and giddiness: Secondary | ICD-10-CM | POA: Diagnosis not present

## 2018-08-19 DIAGNOSIS — R9389 Abnormal findings on diagnostic imaging of other specified body structures: Secondary | ICD-10-CM | POA: Diagnosis not present

## 2018-08-19 DIAGNOSIS — H9201 Otalgia, right ear: Secondary | ICD-10-CM | POA: Diagnosis not present

## 2018-08-19 DIAGNOSIS — G43009 Migraine without aura, not intractable, without status migrainosus: Secondary | ICD-10-CM

## 2018-08-19 NOTE — Progress Notes (Signed)
Subjective:    Patient ID: Joanne Garrett, female    DOB: 04/05/1976, 42 y.o.   MRN: [id number]  HPI Chief Complaint  Patient presents with  . EAR PAIN    ear pain- dizzy- seen ENT and no relief- no ear infection   She is a 42 year old female with a history of RA, breast cancer, HTN, thyroid disorder, and herpes who is here with complaints of a 3 week history of sharp, stabbing right ear pain. Pain is waxing and waning. At times her pain is mild and dull as it is today. Nothing seems to aggravate or alleviate her symptoms. States she also had one episode of dizziness, photosensitivity, nausea that occurred yesterday at work. She took meclizine and symptoms improved.   She has been having intermittent dizziness over the past several months that at times is related to position changes but not always.  History of migraine headaches without aura and was under the care of a neurologist in the distant past. Migraines were without aura and have not been bothersome until recently. She has been taking Aleve for these and for her ear pain.  Denies vision changes, tinnitus, numbness, tingling or weakness.   Reports having some mild nasal congestion without rhinorrhea, tinnitus, sore throat, post nasal drainage, cough.   Denies rash, fatigue, chest pain, palpitations, shortness of breath, cough, abdominal pain, vomiting, diarrhea, urinary symptoms.   Recently saw ENT for the same and symptoms felt to possibly be related chronic neck pain. States she is seeing her neck and back specialist later this week.   States she saw her dentist one month ago and no sign of infection or TMJ.   Reviewed allergies, medications, past medical, surgical, family, and social history.    Review of Systems Pertinent positives and negatives in the history of present illness.     Objective:   Physical Exam  Constitutional: She is oriented to person, place, and time. She appears well-developed and well-nourished.  No distress.  HENT:  Right Ear: Hearing, tympanic membrane, external ear and ear canal normal. No decreased hearing is noted.  Left Ear: Hearing, tympanic membrane, external ear and ear canal normal. No decreased hearing is noted.  Nose: Nose normal. Right sinus exhibits no maxillary sinus tenderness and no frontal sinus tenderness. Left sinus exhibits no maxillary sinus tenderness and no frontal sinus tenderness.  Mouth/Throat: Uvula is midline, oropharynx is clear and moist and mucous membranes are normal.  Mild abnormal skin dysesthesia to right anterior ear without fullness, erythema.  Temporal artery without fullness, bounding or tenderness.  No mastoid tenderness.   Normal TMJ motion without TTP  Eyes: Pupils are equal, round, and reactive to light. Conjunctivae, EOM and lids are normal.  Neck: Normal range of motion. Neck supple.  Pulmonary/Chest: Effort normal.  Musculoskeletal:       Cervical back: Normal.  Lymphadenopathy:    She has no cervical adenopathy.  Neurological: She is alert and oriented to person, place, and time. She has normal strength. No cranial nerve deficit or sensory deficit. Gait normal.  Skin: Skin is warm and dry. Capillary refill takes less than 2 seconds. No rash noted. No pallor.  Psychiatric: She has a normal mood and affect. Her speech is normal and behavior is normal.   BP 120/84   Pulse 76   Temp 98.6 F (37 C) (Oral)   Resp 16   Wt 205 lb 9.6 oz (93.3 kg)   LMP 06/11/2013   SpO2 98%  BMI 36.13 kg/m       Assessment & Plan:  Acute otalgia, right - Plan: Ambulatory referral to Neurology  Dizziness - Plan: Ambulatory referral to Neurology  Migraine without aura and without status migrainosus, not intractable - Plan: Ambulatory referral to Neurology  Abnormal MRI - Plan: Ambulatory referral to Neurology  Discussed that there is no obvious explanation for her symptoms. Differentials include shingles however no rash and symptoms at 3 weeks  so unlikely. Does not appear to have sinus, ear or dental infection. No temporal arteritis or TMJ. She saw ENT and per notes they thought she should follow up with neck specialist so she will see them this week.  Advised that if she notices any new or worsening symptoms to call or return. She may continue with Aleve if helpful.    She continues having recurrent dizziness and has a history of migraine headaches that seem to be flaring up again. Has not seen her neurologist in approximately 15 years. Recent nonspecific findings on MRI brain. Will refer her to establish with neurology.

## 2018-08-20 NOTE — Addendum Note (Signed)
Addended by: Minette Headland A on: 08/20/2018 11:52 AM   Modules accepted: Orders

## 2018-08-22 ENCOUNTER — Encounter: Payer: Self-pay | Admitting: Family Medicine

## 2018-08-22 ENCOUNTER — Other Ambulatory Visit: Payer: Self-pay | Admitting: Family Medicine

## 2018-08-22 DIAGNOSIS — M542 Cervicalgia: Secondary | ICD-10-CM | POA: Diagnosis not present

## 2018-08-22 DIAGNOSIS — G500 Trigeminal neuralgia: Secondary | ICD-10-CM | POA: Diagnosis not present

## 2018-08-25 MED ORDER — MECLIZINE HCL 25 MG PO TABS: 25.0000 mg | ORAL_TABLET | Freq: Two times a day (BID) | ORAL | 0 refills | Status: DC | PRN

## 2018-08-25 NOTE — Telephone Encounter (Signed)
She is seeing a neurologist sooner I thought, Dr. Jaynee Eagles as requested by Dr. Rolena Infante for trigeminal neuralgia. Ok to refill for 30 days.

## 2018-08-25 NOTE — Telephone Encounter (Signed)
Is this ok to refill? Pt does have appt with neurology until December

## 2018-08-26 ENCOUNTER — Encounter: Payer: Self-pay | Admitting: Neurology

## 2018-08-26 ENCOUNTER — Other Ambulatory Visit: Payer: Self-pay

## 2018-08-26 ENCOUNTER — Encounter

## 2018-08-26 ENCOUNTER — Ambulatory Visit (INDEPENDENT_AMBULATORY_CARE_PROVIDER_SITE_OTHER): Payer: BLUE CROSS/BLUE SHIELD | Admitting: Neurology

## 2018-08-26 VITALS — BP 147/99 | HR 75 | Resp 18 | Ht 63.25 in | Wt 204.0 lb

## 2018-08-26 DIAGNOSIS — G500 Trigeminal neuralgia: Secondary | ICD-10-CM | POA: Diagnosis not present

## 2018-08-26 DIAGNOSIS — Z5181 Encounter for therapeutic drug level monitoring: Secondary | ICD-10-CM | POA: Diagnosis not present

## 2018-08-26 NOTE — Progress Notes (Signed)
Reason for visit: Neuralgia pain  Referring physician: Dr. [name] is a 42 y.o. female  History of present illness:   Joanne Garrett is a 42 year old black female with a history of rheumatoid arthritis on methotrexate and Humira.  The patient does have a history of migraine headaches, she indicates that her headaches are not very frequent or severe at this point, she has a mild headache about once a week. In the past, she was seen by Dr. Brett Fairy for this issue.  The patient had MRI of the brain done in May 2019 for onset of dizziness that occurred around that time.  MRI showed multiple nonspecific white matter changes, this was thought possibly related to her history of migraine.  The patient has done well over time until about 3-1/2 weeks ago when she had onset of severe shooting pain coming from the right ear and going into the face and the upper cheek area and around the right eye.  The episodes are not extremely frequent, they may occur once or twice a day but are only lasting only a second or 2.  The patient has not wanted to go on medications for this.  The patient does not note any particular activating factors for the pain such as talking or chewing or swallowing.  The patient denies any numbness of the face, arms, legs, she denies any weakness.  She does have some bladder problems that are chronic in nature, she denies any balance problems or falls.  She has not had any visual disturbances.  She is sent to this office for an evaluation.  Past Medical History:  Diagnosis Date  . Anxiety   . 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 Depression   . Genital herpes   . Hematuria   . History of hyperthyroidism 02/2001   s/p  RAI 03-2001  . History of panic attacks   . History of radiation therapy    completed  09-23-2012  50Gy in 25 fractions and boost completed [phone]  . History of thyroid nodule   . Hypertension   . Renal calculus, right   . Rheumatoid arthritis ([place])   . Urgency of urination     Past Surgical History:  Procedure Laterality Date  . ANTERIOR CERVICAL DECOMP/DISCECTOMY FUSION  05-18-2015   [facility]  . BREAST LUMPECTOMY Left 07-03-2012    [facility]  . CYSTOSCOPY WITH RETROGRADE PYELOGRAM, URETEROSCOPY AND STENT PLACEMENT Right 09/09/2017   Procedure: CYSTOSCOPY WITH RIGHT RETROGRADE PYELOGRAM, URETEROSCOPY , STONE BASKETRY AND STENT PLACEMENT;  Surgeon: Lucas Mallow, MD;  Location: [facility];  Service: Urology;  Laterality: Right;  . KNEE ARTHROSCOPY Left 2009  . TOTAL ABDOMINAL HYSTERECTOMY W/ BILATERAL SALPINGOOPHORECTOMY  2014    Family History  Problem Relation Age of Onset  . Hypertension Mother   . COPD Mother   . Depression Mother   . Hypertension Father   . Kidney disease Father   . Alcohol abuse Father   . Hypertension Brother   . Alzheimer's disease Paternal Grandmother     Social history:  reports that she has never smoked. She has never used smokeless tobacco. She reports that she does not drink alcohol or use drugs.  Medications:  Prior to Admission medications   Medication Sig Start Date End Date Taking? Authorizing Provider  amLODipine (NORVASC) 10 MG tablet TAKE 1 TABLET BY MOUTH DAILY 02/17/18  Yes Henson, Vickie L, NP-C  HUMIRA PEN 40 MG/0.4ML PNKT Inject 40 mg as directed every 14 (fourteen) days. 06/13/18  Yes [provider]  LORazepam (ATIVAN) 0.5 MG tablet 0.25-0.5 mg daily as needed for anxiety 07/24/18  Yes Hisada, Elie Goody, MD  meclizine (ANTIVERT) 25 MG tablet Take 1 tablet (25 mg total) by mouth 2 (two) times daily as needed for dizziness. 08/25/18  Yes Henson, Vickie L, NP-C  methotrexate (50 MG/ML) 1 G injection Inject 40 mg into the vein once a week.    Yes [provider]  Multiple Vitamin (MULTIVITAMIN)  tablet Take 1 tablet by mouth daily.   Yes [provider]  oxybutynin (DITROPAN) 5 MG tablet TK 1 T PO Q 8 H PRF BLADDER SPASM 01/09/18  Yes [provider]  valACYclovir (VALTREX) 1000 MG tablet Take 1 tablet (1,000 mg total) by mouth 2 (two) times daily as needed. X 5 days during an outbreak 08/15/18  Yes Henson, Vickie L, NP-C  venlafaxine XR (EFFEXOR-XR) 150 MG 24 hr capsule Take 1 capsule (150 mg total) by mouth daily. 07/24/18  Yes Norman Clay, MD      Allergies  Allergen Reactions  . Shellfish Allergy Itching and Other (See Comments)    HIVES IF GOES OVERBOARD, HAS HAD IV DYE AND HAD NO PROBLEMS  . Bactrim [Sulfamethoxazole-Trimethoprim] Hives and Other (See Comments)  . Chocolate Flavor Itching and Other (See Comments)    PLAIN CHOCOLATE IN ABUNDANCE    ROS:  Out of a complete 14 system review of symptoms, the patient complains only of the following symptoms, and all other reviewed systems are negative.  Dizziness, difficulty swallowing Blurred vision Urination problems, incontinence Joint pain Slurred speech, difficulty swallowing, dizziness Anxiety, insomnia Snoring  Blood pressure (!) 147/99, pulse 75, resp. rate 18, height 5' 3.25" (1.607 m), weight 204 lb (92.5 kg), last menstrual period 06/11/2013.  Physical Exam  General: The patient is alert and cooperative at the time of the examination.  The patient is moderately to markedly obese.  Eyes: Pupils are equal, round, and reactive to light. Discs are flat bilaterally.  Neck: The neck is supple, no carotid bruits are noted.  Respiratory: The respiratory examination is clear.  Cardiovascular: The cardiovascular examination reveals a regular rate and rhythm, no obvious murmurs or rubs are noted.  Skin: Extremities are without significant edema.  Neurologic Exam  Mental status: The patient is alert and oriented x 3 at the time of the examination. The patient has apparent normal recent and  remote memory, with an apparently normal attention span and concentration ability.  Cranial nerves: Facial symmetry is present. There is good sensation of the face to pinprick and soft touch bilaterally. The strength of the facial muscles and the muscles to head turning and shoulder shrug are normal bilaterally. Speech is well enunciated, no aphasia or dysarthria is noted. Extraocular movements are full. Visual fields are full. The tongue is midline, and the patient has symmetric elevation of the soft palate. No obvious hearing deficits are noted.  Motor: The motor testing reveals 5 over 5 strength of all 4 extremities. Good symmetric motor tone is noted throughout.  Sensory: Sensory testing is intact to pinprick, soft touch, vibration sensation, and position sense on all 4 extremities. No evidence of extinction is noted.  Coordination: Cerebellar testing reveals good finger-nose-finger and heel-to-shin bilaterally.  Gait and station: Gait is normal. Tandem gait is  normal. Romberg is negative. No drift is seen.  Reflexes: Deep tendon reflexes are symmetric and normal bilaterally. Toes are downgoing bilaterally.   MRI brain 03/24/18:  IMPRESSION: 1. No acute intracranial process. 2. Greater than expected number of white matter T2 hyperintensities, though nonspecific these are atypical for demyelination. Can be seen with migraines, chronic small vessel ischemic, old infection or shear injury.  * MRI scan images were reviewed online. I agree with the written report.    Assessment/Plan:  1.  History of migraine headaches  2.  Abnormal MRI of the brain  3.  Dizziness  4.  Trigeminal neuralgia, right V2 distribution  5.  Rheumatoid arthritis  The patient comes in today with a new symptom.  She has developed neuralgia type pain on the right.  Given the history of rheumatoid arthritis, and the fact that she is on Humira and has had an abnormal MRI of the brain previously, we will  recheck the MRI of the brain with and without gadolinium enhancement.  Humira can increase risk for optic neuritis and MS, the presence of another autoimmune disorder also increases this risk.  The patient does not wish to go on medications for the neuralgia pain currently, she will follow-up in 6 months.  If changes are noted on the MRI of the brain, lumbar puncture may be done in the future.  Jill Alexanders MD 08/26/2018 2:54 PM  [facility] [address] [place], [place] [address]  Phone [phone] Fax [phone]

## 2018-08-27 ENCOUNTER — Other Ambulatory Visit: Payer: Self-pay | Admitting: Internal Medicine

## 2018-08-27 ENCOUNTER — Telehealth: Payer: Self-pay | Admitting: Neurology

## 2018-08-27 LAB — COMPREHENSIVE METABOLIC PANEL
ALT: 21 IU/L (ref 0–32)
AST: 23 IU/L (ref 0–40)
Albumin/Globulin Ratio: 1.5 (ref 1.2–2.2)
Albumin: 4.6 g/dL (ref 3.5–5.5)
Alkaline Phosphatase: 81 IU/L (ref 39–117)
BUN/Creatinine Ratio: 12 (ref 9–23)
BUN: 8 mg/dL (ref 6–24)
Bilirubin Total: <0.2 mg/dL (ref 0.0–1.2)
CO2: 24 mmol/L (ref 20–29)
Calcium: 9.2 mg/dL (ref 8.7–10.2)
Chloride: 103 mmol/L (ref 96–106)
Creatinine, Ser: 0.65 mg/dL (ref 0.57–1.00)
GFR calc Af Amer: 126 mL/min/{1.73_m2} (ref >59)
GFR calc non Af Amer: 109 mL/min/{1.73_m2} (ref >59)
Globulin, Total: 3.1 g/dL (ref 1.5–4.5)
Glucose: 84 mg/dL (ref 65–99)
Potassium: 3.1 mmol/L — ABNORMAL LOW (ref 3.5–5.2)
Sodium: 146 mmol/L — ABNORMAL HIGH (ref 134–144)
Total Protein: 7.7 g/dL (ref 6.0–8.5)

## 2018-08-27 MED ORDER — POTASSIUM CHLORIDE CRYS ER 20 MEQ PO TBCR: 20.0000 meq | EXTENDED_RELEASE_TABLET | Freq: Every day | ORAL | 0 refills | Status: DC

## 2018-08-27 NOTE — Telephone Encounter (Signed)
MR Brain w/wo contrast Dr. Jannifer Franklin BCBS Auth: [place] via [facility] website. Patient is scheduled for 09/02/18 at [facility].

## 2018-08-27 NOTE — Telephone Encounter (Signed)
I called the patient.  The blood work shows a mildly elevated sodium level and a low potassium level, the patient has had episodes of low potassium multiple times in the past, she has been on potassium supplementation previously, the etiology of this is not clear.  The patient may need to see a nephrologist in the future.  She claims that her primary doctor will prescribe potassium supplementation and has done in the past.  I will send the report to the primary care physician.

## 2018-09-02 ENCOUNTER — Ambulatory Visit (INDEPENDENT_AMBULATORY_CARE_PROVIDER_SITE_OTHER): Payer: BLUE CROSS/BLUE SHIELD

## 2018-09-02 DIAGNOSIS — G500 Trigeminal neuralgia: Secondary | ICD-10-CM | POA: Diagnosis not present

## 2018-09-02 MED ORDER — GADOBENATE DIMEGLUMINE 529 MG/ML IV SOLN
20.0000 mL | Freq: Once | INTRAVENOUS | Status: AC | PRN
  Administered 2018-09-02: 20 mL via INTRAVENOUS

## 2018-09-03 ENCOUNTER — Ambulatory Visit: Payer: BLUE CROSS/BLUE SHIELD | Admitting: Family Medicine

## 2018-09-03 ENCOUNTER — Telehealth: Payer: Self-pay | Admitting: Neurology

## 2018-09-03 NOTE — Telephone Encounter (Signed)
  I called the patient. The MRI of the brain is normal, the WM lesions seen previously are not noted, may be due to a difference in the scanners used. No evidence of MS.   MRI brain 09/03/18:  IMPRESSION:   Unremarkable MRI brain (with and without).

## 2018-09-03 NOTE — Telephone Encounter (Signed)
Pt states she had her MRI on yesterday, she is asking to be scheduled an appointment with Dr Jannifer Franklin to compare MRI from yesterday to previous one.  Pt informed that the day the results will be reviewed by Dr is unknown, pt asking to be called by RN with results and a date to come in to compare the 2 MRI's

## 2018-09-03 NOTE — Telephone Encounter (Signed)
Spoke with pt.and explained that as soon as Dr. Jannifer Franklin as reviewed MRI results, he will either call pt. himself, as he often does, or ask Terrence Dupont or me to call her.  But, he does often call the pt. himself to review MRI results.  Once he has reviewed the results, if he needs to see pt. back to discuss, then we will call her to make that appt. However, she was just seen 10/22, and at this time there is no need to schedule another sooner appt.  She verbalized understanding of same/fim

## 2018-09-08 MED ORDER — GABAPENTIN 100 MG PO CAPS: 100.0000 mg | ORAL_CAPSULE | Freq: Three times a day (TID) | ORAL | 3 refills | Status: DC

## 2018-09-08 NOTE — Telephone Encounter (Signed)
I called the patient.  The patient indicates that the neuralgia pain has worsened and now she has decided to go on medications.  The patient will be sent in a prescription for gabapentin, she will call for any dose adjustments.

## 2018-09-08 NOTE — Telephone Encounter (Signed)
Pt called for the results of the MRI, message from Dr Jannifer Franklin was read to her.  Pt asking for a call back because she is still having same symptoms.  Please call

## 2018-09-08 NOTE — Telephone Encounter (Signed)
Noted/fim 

## 2018-09-08 NOTE — Addendum Note (Signed)
Addended by: Kathrynn Ducking on: 09/08/2018 11:45 AM   Modules accepted: Orders

## 2018-09-11 ENCOUNTER — Ambulatory Visit (INDEPENDENT_AMBULATORY_CARE_PROVIDER_SITE_OTHER): Payer: BLUE CROSS/BLUE SHIELD | Admitting: Family Medicine

## 2018-09-11 ENCOUNTER — Encounter: Payer: Self-pay | Admitting: Family Medicine

## 2018-09-11 VITALS — BP 120/70 | HR 87 | Temp 98.8°F | Resp 16 | Wt 206.2 lb

## 2018-09-11 DIAGNOSIS — I10 Essential (primary) hypertension: Secondary | ICD-10-CM | POA: Diagnosis not present

## 2018-09-11 DIAGNOSIS — E876 Hypokalemia: Secondary | ICD-10-CM

## 2018-09-11 LAB — BASIC METABOLIC PANEL
BUN/Creatinine Ratio: 14 (ref 9–23)
BUN: 9 mg/dL (ref 6–24)
CO2: 25 mmol/L (ref 20–29)
Calcium: 8.9 mg/dL (ref 8.7–10.2)
Chloride: 101 mmol/L (ref 96–106)
Creatinine, Ser: 0.65 mg/dL (ref 0.57–1.00)
GFR calc Af Amer: 126 mL/min/{1.73_m2} (ref >59)
GFR calc non Af Amer: 109 mL/min/{1.73_m2} (ref >59)
Glucose: 85 mg/dL (ref 65–99)
Potassium: 3.3 mmol/L — ABNORMAL LOW (ref 3.5–5.2)
Sodium: 144 mmol/L (ref 134–144)

## 2018-09-11 NOTE — Progress Notes (Signed)
   Subjective:    Patient ID: Joanne Garrett, female    DOB: 1975/11/24, 42 y.o.   MRN: [id number]  HPI Chief Complaint  Patient presents with  . potassium recheck    recheck potassium   She is here to recheck potassium. History of hypokalemia without a known cause.   HTN- taking amlodipine daily.   States she is taking 20 MEQ K-Dur daily.   Denies fever, chills, chest pain, palpitations, shortness of breath, abdominal pain, N/V/D.   States she saw Dr. Gloriann Loan at [facility] for hematuria work up.   Review of Systems Pertinent positives and negatives in the history of present illness.     Objective:   Physical Exam BP 120/70   Pulse 87   Temp 98.8 F (37.1 C) (Oral)   Resp 16   Wt 206 lb 3.2 oz (93.5 kg)   LMP 06/11/2013   SpO2 98%   BMI 36.24 kg/m   Alert and oriented and in no acute distress. Not otherwise examined.       Assessment & Plan:  Hypokalemia - Plan: Basic metabolic panel  Essential hypertension - Plan: Basic metabolic panel  Recheck potassium. Asymptomatic. Adjust dose as appropriate.  Consider referral to nephrologist for persistent hypokalemia.

## 2018-09-12 ENCOUNTER — Other Ambulatory Visit: Payer: Self-pay | Admitting: Internal Medicine

## 2018-09-12 DIAGNOSIS — E876 Hypokalemia: Secondary | ICD-10-CM

## 2018-09-12 DIAGNOSIS — Z79899 Other long term (current) drug therapy: Secondary | ICD-10-CM

## 2018-09-12 MED ORDER — POTASSIUM CHLORIDE CRYS ER 20 MEQ PO TBCR: EXTENDED_RELEASE_TABLET | ORAL | 0 refills | Status: DC

## 2018-09-12 NOTE — Progress Notes (Signed)
Patient has been informed of results. She will call back to schedule a recheck of her potassium.Marland KitchenMarland KitchenMarland Kitchen

## 2018-09-15 ENCOUNTER — Telehealth: Payer: Self-pay | Admitting: Neurology

## 2018-09-15 NOTE — Telephone Encounter (Signed)
Patient calling to get a sooner appointment with Dr Jannifer Franklin. She has an appointment on 03/10/2019 but is having problems with memory & confusion. Please call.

## 2018-09-15 NOTE — Telephone Encounter (Signed)
Spoke with [place]. She sts. she has been having more trouble with memory, maybe focus/concentration in the last couple of mos., and would like to speak with [facility] about this.  Appt. given 09/18/18, arrival time 0830 for a 9am appt/fim

## 2018-09-18 ENCOUNTER — Other Ambulatory Visit: Payer: Self-pay

## 2018-09-18 ENCOUNTER — Ambulatory Visit (INDEPENDENT_AMBULATORY_CARE_PROVIDER_SITE_OTHER): Payer: BLUE CROSS/BLUE SHIELD | Admitting: Neurology

## 2018-09-18 ENCOUNTER — Encounter: Payer: Self-pay | Admitting: Neurology

## 2018-09-18 VITALS — BP 126/86 | HR 82 | Resp 16 | Ht 63.25 in | Wt 206.0 lb

## 2018-09-18 DIAGNOSIS — R413 Other amnesia: Secondary | ICD-10-CM | POA: Diagnosis not present

## 2018-09-18 MED ORDER — GABAPENTIN 300 MG PO CAPS: 300.0000 mg | ORAL_CAPSULE | Freq: Two times a day (BID) | ORAL | 3 refills | Status: DC

## 2018-09-18 NOTE — Patient Instructions (Signed)
Go on melatonin 3 to 5 mg at night.  Increase the gabapentin to 300 mg twice a day.  Neurontin (gabapentin) may result in drowsiness, ankle swelling, gait instability, or possibly dizziness. Please contact our office if significant side effects occur with this medication.

## 2018-09-18 NOTE — Progress Notes (Signed)
Reason for visit: Trigeminal neuralgia, memory problems  Joanne Garrett is an 42 y.o. female  History of present illness:  Joanne Garrett is a 42 year old right-handed black female with a history of problems with neuralgia type pain in the right V2 distribution.  The patient is on gabapentin taking a total of 300 mg daily, she is still having twinges of pain but they are not severe in nature.  The patient also reports watering of the right eye with the episodes of pain.  The patient comes in today with a new problem.  She indicates that over the last month she has had some problems with memory, she does not clearly relate to onset of using gabapentin.  She also takes oxybutynin but claims that she only takes the tablet about once a week.  She is on Ativan but she has not used this in over 1 month.  The patient denies any fatigue issues, she has irregular sleep patterns however.  She will get 4 or 5 hours asleep at night, but she indicates that this seems to be enough for her.  Her body wants to stay awake at nighttime and sleep during the day, on weekends that is what she does.  On weekdays, she goes to bed around 9 PM but then wakes up 4 or 5 hours later and then stays awake.  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  She claims that she has good days and bad days with her cognitive functioning.  She denies any other new events of numbness or weakness or balance changes.  Past Medical History:  Diagnosis Date  . Anxiety   . 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 Depression   . Genital herpes   . Hematuria   . History of hyperthyroidism 02/2001   s/p  RAI 03-2001  . History of panic attacks   . History of radiation therapy    completed  09-23-2012  50Gy in 25 fractions and boost completed [phone]  . History of thyroid nodule   . Hypertension   . Renal calculus, right   . Rheumatoid arthritis ([place])   . Urgency of urination     Past Surgical History:  Procedure Laterality Date  . ANTERIOR CERVICAL DECOMP/DISCECTOMY FUSION  05-18-2015   [facility]  . BREAST LUMPECTOMY Left 07-03-2012    [facility]  . CYSTOSCOPY WITH RETROGRADE PYELOGRAM, URETEROSCOPY AND STENT PLACEMENT Right 09/09/2017   Procedure: CYSTOSCOPY WITH RIGHT RETROGRADE PYELOGRAM, URETEROSCOPY , STONE BASKETRY AND STENT PLACEMENT;  Surgeon: Lucas Mallow, MD;  Location: [facility];  Service: Urology;  Laterality: Right;  . KNEE ARTHROSCOPY Left 2009  . TOTAL ABDOMINAL HYSTERECTOMY W/ BILATERAL SALPINGOOPHORECTOMY  2014    Family History  Problem Relation Age of Onset  . Hypertension Mother   . COPD Mother   . Depression Mother   . Hypertension Father   . Kidney disease Father   . Alcohol abuse Father   . Hypertension Brother   . Alzheimer's disease Paternal Grandmother     Social history:  reports that she has never smoked. She has never used smokeless tobacco. She reports that she does not drink alcohol or use drugs.    Allergies  Allergen Reactions  . Shellfish  Allergy Itching and Other (See Comments)    HIVES IF GOES OVERBOARD, HAS HAD IV DYE AND HAD NO PROBLEMS  . Bactrim [Sulfamethoxazole-Trimethoprim] Hives and Other (See Comments)  . Chocolate Flavor Itching and Other (See Comments)    PLAIN CHOCOLATE IN ABUNDANCE    Medications:  Prior to Admission medications   Medication Sig Start Date End Date Taking? Authorizing Provider  amLODipine (NORVASC) 10 MG tablet TAKE 1 TABLET BY MOUTH DAILY 02/17/18  Yes Henson, Vickie L, NP-C  gabapentin (NEURONTIN) 100 MG capsule Take 1 capsule (100 mg total) by mouth 3 (three) times daily. 09/08/18  Yes Kathrynn Ducking, MD  HUMIRA PEN 40 MG/0.4ML PNKT Inject 40 mg as directed every 14  (fourteen) days. 06/13/18  Yes [provider]  LORazepam (ATIVAN) 0.5 MG tablet 0.25-0.5 mg daily as needed for anxiety 07/24/18  Yes Hisada, Elie Goody, MD  meclizine (ANTIVERT) 25 MG tablet Take 1 tablet (25 mg total) by mouth 2 (two) times daily as needed for dizziness. 08/25/18  Yes Henson, Vickie L, NP-C  methotrexate (50 MG/ML) 1 G injection Inject 40 mg into the vein once a week.    Yes [provider]  Multiple Vitamin (MULTIVITAMIN) tablet Take 1 tablet by mouth daily.   Yes [provider]  oxybutynin (DITROPAN) 5 MG tablet TK 1 T PO Q 8 H PRF BLADDER SPASM 01/09/18  Yes [provider]  potassium chloride SA (K-DUR,KLOR-CON) 20 MEQ tablet Take 2 tablets x 7 days, then 1 tablet daily 09/12/18  Yes Henson, Vickie L, NP-C  valACYclovir (VALTREX) 1000 MG tablet Take 1 tablet (1,000 mg total) by mouth 2 (two) times daily as needed. X 5 days during an outbreak 08/15/18  Yes Henson, Vickie L, NP-C  venlafaxine XR (EFFEXOR-XR) 150 MG 24 hr capsule Take 1 capsule (150 mg total) by mouth daily. 07/24/18  Yes Norman Clay, MD    ROS:  Out of a complete 14 system review of symptoms, the patient complains only of the following symptoms, and all other reviewed systems are negative.  Memory loss Headache  Blood pressure 126/86, pulse 82, resp. rate 16, height 5' 3.25" (1.607 m), weight 206 lb (93.4 kg), last menstrual period 06/11/2013.  Physical Exam  General: The patient is alert and cooperative at the time of the examination.  The patient is moderately to markedly obese.  Skin: No significant peripheral edema is noted.   Neurologic Exam  Mental status: The patient is alert and oriented x 3 at the time of the examination. The patient has apparent normal recent and remote memory, with an apparently normal attention span and concentration ability.  Mini-Mental status examination done today shows a total score 29/30.  The patient is able to name 11 four-legged  animals in 1 minute.   Cranial nerves: Facial symmetry is present. Speech is normal, no aphasia or dysarthria is noted. Extraocular movements are full. Visual fields are full.  Motor: The patient has good strength in all 4 extremities.  Sensory examination: Soft touch sensation is symmetric on the face, arms, and legs.  Coordination: The patient has good finger-nose-finger and heel-to-shin bilaterally.  Gait and station: The patient has a normal gait. Tandem gait is normal. Romberg is negative, but is slightly unsteady. No drift is seen.  Reflexes: Deep tendon reflexes are symmetric.   MRI brain 09/03/18:  IMPRESSION:   Unremarkable MRI brain (with and without).  * MRI scan images were reviewed online. I agree with the written report.  Assessment/Plan:  1.  Reported memory disturbance  2.  Right V2 neuralgia pain, trigeminal neuralgia versus SUNCT headache  The patient believes that she can tolerate a higher dose of gabapentin, she will go to 300 mg twice daily.  The patient has an irregular sleep pattern, she will start taking melatonin between 3 mg to 5 mg at night to help as a sleep onset regulator.  Blood work will be done today, the memory issues will be followed over time.  Previous MRI of the brain was completely normal.  The patient appears to have neuralgia type pain in the right V2 distribution, but she reports watering of the right eye with this.  For this reason, a SUNCT headache does need to be considered, this is usually in the V1 distribution but can be in the V2 or V3 distributions as well.  Jill Alexanders MD 09/18/2018 9:48 AM  [facility] [address] [place], [place] [address]  Phone [phone] Fax [phone]

## 2018-09-19 LAB — HIV ANTIBODY (ROUTINE TESTING W REFLEX): HIV Screen 4th Generation wRfx: NONREACTIVE

## 2018-09-19 LAB — RPR: RPR Ser Ql: NONREACTIVE

## 2018-09-19 LAB — SEDIMENTATION RATE: Sed Rate: 12 mm/hr (ref 0–32)

## 2018-09-19 LAB — VITAMIN B12: Vitamin B-12: 409 pg/mL (ref 232–1245)

## 2018-10-06 ENCOUNTER — Other Ambulatory Visit (HOSPITAL_COMMUNITY): Payer: Self-pay | Admitting: Psychiatry

## 2018-10-06 ENCOUNTER — Ambulatory Visit: Payer: Self-pay | Admitting: Neurology

## 2018-10-06 MED ORDER — VENLAFAXINE HCL ER 150 MG PO CP24: 150.0000 mg | ORAL_CAPSULE | Freq: Every day | ORAL | 0 refills | Status: DC

## 2018-10-08 ENCOUNTER — Ambulatory Visit: Payer: BLUE CROSS/BLUE SHIELD | Admitting: Neurology

## 2018-10-17 ENCOUNTER — Telehealth: Payer: Self-pay | Admitting: Neurology

## 2018-10-17 DIAGNOSIS — M0579 Rheumatoid arthritis with rheumatoid factor of multiple sites without organ or systems involvement: Secondary | ICD-10-CM | POA: Diagnosis not present

## 2018-10-17 DIAGNOSIS — Z79899 Other long term (current) drug therapy: Secondary | ICD-10-CM | POA: Diagnosis not present

## 2018-10-17 MED ORDER — ONDANSETRON HCL 4 MG PO TABS: 4.0000 mg | ORAL_TABLET | Freq: Three times a day (TID) | ORAL | 2 refills | Status: DC | PRN

## 2018-10-17 MED ORDER — GABAPENTIN 300 MG PO CAPS: 600.0000 mg | ORAL_CAPSULE | Freq: Two times a day (BID) | ORAL | 3 refills | Status: DC

## 2018-10-17 NOTE — Telephone Encounter (Signed)
I called the patient.  The patient still having frequent headaches, she is on gabapentin taking 3 mg twice daily, we will go up to 1 in the morning and 2 in the evening for a week and go to 2 twice daily.  I will send in a prescription for Zofran, I am not sure that she has true trigeminal neuralgia particular if she is having nausea with this.  If the headaches do not improve soon, I may add Topamax to the regimen, the patient could have a SUNCT headache.

## 2018-10-17 NOTE — Addendum Note (Signed)
Addended by: Kathrynn Ducking on: 10/17/2018 12:46 PM   Modules accepted: Orders
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
## 2018-10-17 NOTE — Telephone Encounter (Signed)
Pt is asking if something could be called in for nausea because her headaches are not getting any better.  Pt would like [facility] [phone] to be used   .  Please call

## 2018-10-23 ENCOUNTER — Ambulatory Visit (INDEPENDENT_AMBULATORY_CARE_PROVIDER_SITE_OTHER): Payer: BLUE CROSS/BLUE SHIELD | Admitting: Psychiatry

## 2018-10-23 ENCOUNTER — Encounter (HOSPITAL_COMMUNITY): Payer: Self-pay | Admitting: Psychiatry

## 2018-10-23 VITALS — BP 128/86 | HR 96 | Ht 63.25 in | Wt 211.0 lb

## 2018-10-23 DIAGNOSIS — F3341 Major depressive disorder, recurrent, in partial remission: Secondary | ICD-10-CM

## 2018-10-23 DIAGNOSIS — F4310 Post-traumatic stress disorder, unspecified: Secondary | ICD-10-CM | POA: Diagnosis not present

## 2018-10-23 NOTE — Patient Instructions (Signed)
1. Continue venlafaxine 150 mg daily  2. Continue ativan 0.25-0.5 mg daily as needed for anxiety  3.Return to clinic inthree months for 15 mins

## 2018-11-13 DIAGNOSIS — M79644 Pain in right finger(s): Secondary | ICD-10-CM | POA: Diagnosis not present

## 2018-11-19 DIAGNOSIS — K582 Mixed irritable bowel syndrome: Secondary | ICD-10-CM | POA: Diagnosis not present

## 2018-11-19 DIAGNOSIS — R110 Nausea: Secondary | ICD-10-CM | POA: Diagnosis not present

## 2018-11-19 DIAGNOSIS — K2950 Unspecified chronic gastritis without bleeding: Secondary | ICD-10-CM | POA: Diagnosis not present

## 2018-11-19 DIAGNOSIS — R1084 Generalized abdominal pain: Secondary | ICD-10-CM | POA: Diagnosis not present

## 2018-11-20 ENCOUNTER — Ambulatory Visit (INDEPENDENT_AMBULATORY_CARE_PROVIDER_SITE_OTHER): Payer: BLUE CROSS/BLUE SHIELD | Admitting: Family Medicine

## 2018-11-20 ENCOUNTER — Encounter: Payer: Self-pay | Admitting: Family Medicine

## 2018-11-20 VITALS — BP 140/90 | HR 74 | Wt 206.0 lb

## 2018-11-20 DIAGNOSIS — E876 Hypokalemia: Secondary | ICD-10-CM | POA: Diagnosis not present

## 2018-11-20 DIAGNOSIS — I10 Essential (primary) hypertension: Secondary | ICD-10-CM | POA: Diagnosis not present

## 2018-11-20 DIAGNOSIS — Z8639 Personal history of other endocrine, nutritional and metabolic disease: Secondary | ICD-10-CM

## 2018-11-20 DIAGNOSIS — E7800 Pure hypercholesterolemia, unspecified: Secondary | ICD-10-CM | POA: Diagnosis not present

## 2018-11-20 NOTE — Progress Notes (Signed)
Subjective:    Patient ID: Joanne Garrett, female    DOB: [birth year], 43 y.o.   MRN: [id number]  HPI Chief Complaint  Patient presents with  . bp up    bp yesterday was up at GI.    She is here to discuss recently elevated blood pressures with a history of controlled HTN.   States she is unsure when her BP started being elevated since she does not check it on a regular basis.  Reports seeing her gastroenterologist yesterday for abdominal pain and her blood pressure there was 160/110 approximately and this morning it was 150/100 at home.  Reports taking amlodipine 10 mg daily. She occasionally misses doses and prior to her GI appointment she reports having missed 2 days of the amlodipine.  Reports her diet has been fairly high in sodium.  She is not getting regular exercise or vigorous exercise.  She does walk her dog daily.  Denies fever, chills, dizziness, headache, vision changes, chest pain, palpitations, shortness of breath, abdominal pain, nausea, vomiting, diarrhea, urinary symptoms, LE edema.  [facility]. States she was diagnosed with IBS. States she had negative EGD and colonoscopy.   She has had hypokalemia on several visits without any explanation.  Started her on a potassium supplement and she needs a recheck of her potassium level. She has been referred to nephrology and does not yet have an appointment.  States she is no longer taking Humira and has a follow-up visit with her rheumatologist.  History of thyroid nodules with most recent ultrasound showing no follow-up needed.  IMPRESSION: 1. Small, heterogeneous thyroid gland consistent with the clinical history of prior radioactive iodine therapy. 2. Incidental note is made of small thyroid nodules and cysts bilaterally which do not meet criteria for biopsy or further evaluation. The 0.8 cm mixed cystic and solid nodule exophytic from the thyroid isthmus may be palpable.  The above is in keeping with the ACR  TI-RADS recommendations - J Am Coll Radiol 2017;14:587-595.  Electronically Signed   By: Jacqulynn Cadet M.D.   On: 03/25/2018 16:44  Reviewed allergies, medications, past medical, surgical, family, and social history.    Review of Systems Pertinent positives and negatives in the history of present illness.     Objective:   Physical Exam BP 140/90   Pulse 74   Wt 206 lb (93.4 kg)   LMP 06/11/2013   BMI 36.20 kg/m   Alert and oriented and in no distress.  Pharyngeal area is normal. Neck is supple with normal ROM. Cardiac exam shows a regular rhythm without murmurs or gallops. Lungs are clear to auscultation. Extremities without edema. Skin is warm and dry. DTRs symmetric and normal, no clonus. PERRLA, CNs intact.       Assessment & Plan:  Uncontrolled hypertension - Plan: CBC with Differential/Platelet, Comprehensive metabolic panel  Hypokalemia - Plan: Comprehensive metabolic panel  History of thyroid disorder - Plan: TSH, T4, free, T3  Elevated cholesterol - Plan: Lipid panel  Encouraged her to get a blood pressure cuff and start checking her blood pressure at home.  She is aware that her blood pressure is not at goal range and we discussed the option of adding a second medication.  She declines this for now and would prefer to try to improve her diet and increase her physical activity.  We will give her 2 to 4 weeks to see if her blood pressure improves.  She is aware of potential long-term health consequences related to uncontrolled  hypertension. Recheck labs and follow-up in 2 to 4 weeks

## 2018-11-20 NOTE — Patient Instructions (Signed)
Your blood pressure today is 144/94 and not in goal range. Goal blood pressure is less than 130/80.  Start checking your blood pressure daily and bring in your blood pressure machine and readings. If your blood pressures continue being significantly elevated as they are today then come back in 2 weeks.  We will call you with your lab results    DASH Eating Plan DASH stands for "Dietary Approaches to Stop Hypertension." The DASH eating plan is a healthy eating plan that has been shown to reduce high blood pressure (hypertension). It may also reduce your risk for type 2 diabetes, heart disease, and stroke. The DASH eating plan may also help with weight loss. What are tips for following this plan?  General guidelines  Avoid eating more than 2,300 mg (milligrams) of salt (sodium) a day. If you have hypertension, you may need to reduce your sodium intake to 1,500 mg a day.  Limit alcohol intake to no more than 1 drink a day for nonpregnant women and 2 drinks a day for men. One drink equals 12 oz of beer, 5 oz of wine, or 1 oz of hard liquor.  Work with your health care provider to maintain a healthy body weight or to lose weight. Ask what an ideal weight is for you.  Get at least 30 minutes of exercise that causes your heart to beat faster (aerobic exercise) most days of the week. Activities may include walking, swimming, or biking.  Work with your health care provider or diet and nutrition specialist (dietitian) to adjust your eating plan to your individual calorie needs. Reading food labels   Check food labels for the amount of sodium per serving. Choose foods with less than 5 percent of the Daily Value of sodium. Generally, foods with less than 300 mg of sodium per serving fit into this eating plan.  To find whole grains, look for the word "whole" as the first word in the ingredient list. Shopping  Buy products labeled as "low-sodium" or "no salt added."  Buy fresh foods. Avoid  canned foods and premade or frozen meals. Cooking  Avoid adding salt when cooking. Use salt-free seasonings or herbs instead of table salt or sea salt. Check with your health care provider or pharmacist before using salt substitutes.  Do not fry foods. Cook foods using healthy methods such as baking, boiling, grilling, and broiling instead.  Cook with heart-healthy oils, such as olive, canola, soybean, or sunflower oil. Meal planning  Eat a balanced diet that includes: ? 5 or more servings of fruits and vegetables each day. At each meal, try to fill half of your plate with fruits and vegetables. ? Up to 6-8 servings of whole grains each day. ? Less than 6 oz of lean meat, poultry, or fish each day. A 3-oz serving of meat is about the same size as a deck of cards. One egg equals 1 oz. ? 2 servings of low-fat dairy each day. ? A serving of nuts, seeds, or beans 5 times each week. ? Heart-healthy fats. Healthy fats called Omega-3 fatty acids are found in foods such as flaxseeds and coldwater fish, like sardines, salmon, and mackerel.  Limit how much you eat of the following: ? Canned or prepackaged foods. ? Food that is high in trans fat, such as fried foods. ? Food that is high in saturated fat, such as fatty meat. ? Sweets, desserts, sugary drinks, and other foods with added sugar. ? Full-fat dairy products.  Do not salt  foods before eating.  Try to eat at least 2 vegetarian meals each week.  Eat more home-cooked food and less restaurant, buffet, and fast food.  When eating at a restaurant, ask that your food be prepared with less salt or no salt, if possible. What foods are recommended? The items listed may not be a complete list. Talk with your dietitian about what dietary choices are best for you. Grains Whole-grain or whole-wheat bread. Whole-grain or whole-wheat pasta. Brown rice. [place]. Bulgur. Whole-grain and low-sodium cereals. Pita bread. Low-fat, low-sodium  crackers. Whole-wheat flour tortillas. Vegetables Fresh or frozen vegetables (raw, steamed, roasted, or grilled). Low-sodium or reduced-sodium tomato and vegetable juice. Low-sodium or reduced-sodium tomato sauce and tomato paste. Low-sodium or reduced-sodium canned vegetables. Fruits All fresh, dried, or frozen fruit. Canned fruit in natural juice (without added sugar). Meat and other protein foods Skinless chicken or [place]. Ground chicken or [place]. Pork with fat trimmed off. Fish and seafood. Egg whites. Dried beans, peas, or lentils. Unsalted nuts, nut butters, and seeds. Unsalted canned beans. Lean cuts of beef with fat trimmed off. Low-sodium, lean deli meat. Dairy Low-fat (1%) or fat-free (skim) milk. Fat-free, low-fat, or reduced-fat cheeses. Nonfat, low-sodium ricotta or cottage cheese. Low-fat or nonfat yogurt. Low-fat, low-sodium cheese. Fats and oils Soft margarine without trans fats. Vegetable oil. Low-fat, reduced-fat, or light mayonnaise and salad dressings (reduced-sodium). Canola, safflower, olive, soybean, and sunflower oils. Avocado. Seasoning and other foods Herbs. Spices. Seasoning mixes without salt. Unsalted popcorn and pretzels. Fat-free sweets. What foods are not recommended? The items listed may not be a complete list. Talk with your dietitian about what dietary choices are best for you. Grains Baked goods made with fat, such as croissants, muffins, or some breads. Dry pasta or rice meal packs. Vegetables Creamed or fried vegetables. Vegetables in a cheese sauce. Regular canned vegetables (not low-sodium or reduced-sodium). Regular canned tomato sauce and paste (not low-sodium or reduced-sodium). Regular tomato and vegetable juice (not low-sodium or reduced-sodium). Angie Fava. Olives. Fruits Canned fruit in a light or heavy syrup. Fried fruit. Fruit in cream or butter sauce. Meat and other protein foods Fatty cuts of meat. Ribs. Fried meat. Berniece Salines. Sausage. Bologna and  other processed lunch meats. Salami. Fatback. Hotdogs. Bratwurst. Salted nuts and seeds. Canned beans with added salt. Canned or smoked fish. Whole eggs or egg yolks. Chicken or [place] with skin. Dairy Whole or 2% milk, cream, and half-and-half. Whole or full-fat cream cheese. Whole-fat or sweetened yogurt. Full-fat cheese. Nondairy creamers. Whipped toppings. Processed cheese and cheese spreads. Fats and oils Butter. Stick margarine. Lard. Shortening. Ghee. Bacon fat. Tropical oils, such as coconut, palm kernel, or palm oil. Seasoning and other foods Salted popcorn and pretzels. Onion salt, garlic salt, seasoned salt, table salt, and sea salt. Worcestershire sauce. Tartar sauce. Barbecue sauce. Teriyaki sauce. Soy sauce, including reduced-sodium. Steak sauce. Canned and packaged gravies. Fish sauce. Oyster sauce. Cocktail sauce. Horseradish that you find on the shelf. Ketchup. Mustard. Meat flavorings and tenderizers. Bouillon cubes. Hot sauce and Tabasco sauce. Premade or packaged marinades. Premade or packaged taco seasonings. Relishes. Regular salad dressings. Where to find more information:  National Heart, Lung, and [place]: https://wilson-eaton.com/  American Heart Association: www.heart.org Summary  The DASH eating plan is a healthy eating plan that has been shown to reduce high blood pressure (hypertension). It may also reduce your risk for type 2 diabetes, heart disease, and stroke.  With the DASH eating plan, you should limit salt (sodium) intake to 2,300  mg a day. If you have hypertension, you may need to reduce your sodium intake to 1,500 mg a day.  When on the DASH eating plan, aim to eat more fresh fruits and vegetables, whole grains, lean proteins, low-fat dairy, and heart-healthy fats.  Work with your health care provider or diet and nutrition specialist (dietitian) to adjust your eating plan to your individual calorie needs. This information is not intended to replace advice  given to you by your health care provider. Make sure you discuss any questions you have with your health care provider. Document Released: 10/11/2011 Document Revised: 10/15/2016 Document Reviewed: 10/15/2016 Elsevier Interactive Patient Education  2019 [employer].

## 2018-11-21 LAB — COMPREHENSIVE METABOLIC PANEL
ALT: 18 IU/L (ref 0–32)
AST: 16 IU/L (ref 0–40)
Albumin/Globulin Ratio: 1.6 (ref 1.2–2.2)
Albumin: 4.5 g/dL (ref 3.5–5.5)
Alkaline Phosphatase: 84 IU/L (ref 39–117)
BUN/Creatinine Ratio: 19 (ref 9–23)
BUN: 13 mg/dL (ref 6–24)
Bilirubin Total: <0.2 mg/dL (ref 0.0–1.2)
CO2: 27 mmol/L (ref 20–29)
Calcium: 9.7 mg/dL (ref 8.7–10.2)
Chloride: 103 mmol/L (ref 96–106)
Creatinine, Ser: 0.67 mg/dL (ref 0.57–1.00)
GFR calc Af Amer: 125 mL/min/{1.73_m2} (ref >59)
GFR calc non Af Amer: 108 mL/min/{1.73_m2} (ref >59)
Globulin, Total: 2.9 g/dL (ref 1.5–4.5)
Glucose: 91 mg/dL (ref 65–99)
Potassium: 3.4 mmol/L — ABNORMAL LOW (ref 3.5–5.2)
Sodium: 145 mmol/L — ABNORMAL HIGH (ref 134–144)
Total Protein: 7.4 g/dL (ref 6.0–8.5)

## 2018-11-21 LAB — CBC WITH DIFFERENTIAL/PLATELET
Basophils Absolute: 0 10*3/uL (ref 0.0–0.2)
Basos: 1 %
EOS (ABSOLUTE): 0.2 10*3/uL (ref 0.0–0.4)
Eos: 3 %
Hematocrit: 39.3 % (ref 34.0–46.6)
Hemoglobin: 12.9 g/dL (ref 11.1–15.9)
Immature Grans (Abs): 0 10*3/uL (ref 0.0–0.1)
Immature Granulocytes: 0 %
Lymphocytes Absolute: 1.9 10*3/uL (ref 0.7–3.1)
Lymphs: 30 %
MCH: 27.9 pg (ref 26.6–33.0)
MCHC: 32.8 g/dL (ref 31.5–35.7)
MCV: 85 fL (ref 79–97)
Monocytes Absolute: 0.6 10*3/uL (ref 0.1–0.9)
Monocytes: 10 %
Neutrophils Absolute: 3.6 10*3/uL (ref 1.4–7.0)
Neutrophils: 56 %
Platelets: 228 10*3/uL (ref 150–450)
RBC: 4.63 x10E6/uL (ref 3.77–5.28)
RDW: 14.3 % (ref 11.7–15.4)
WBC: 6.3 10*3/uL (ref 3.4–10.8)

## 2018-11-21 LAB — T3: T3, Total: 119 ng/dL (ref 71–180)

## 2018-11-21 LAB — LIPID PANEL
Chol/HDL Ratio: 3 ratio (ref 0.0–4.4)
Cholesterol, Total: 226 mg/dL — ABNORMAL HIGH (ref 100–199)
HDL: 75 mg/dL (ref >39)
LDL Calculated: 131 mg/dL — ABNORMAL HIGH (ref 0–99)
Triglycerides: 101 mg/dL (ref 0–149)
VLDL Cholesterol Cal: 20 mg/dL (ref 5–40)

## 2018-11-21 LAB — T4, FREE: Free T4: 1.01 ng/dL (ref 0.82–1.77)

## 2018-11-21 LAB — TSH: TSH: 0.943 u[IU]/mL (ref 0.450–4.500)

## 2018-12-15 ENCOUNTER — Encounter: Payer: Self-pay | Admitting: Family Medicine

## 2018-12-15 ENCOUNTER — Ambulatory Visit (INDEPENDENT_AMBULATORY_CARE_PROVIDER_SITE_OTHER): Payer: BLUE CROSS/BLUE SHIELD | Admitting: Family Medicine

## 2018-12-15 VITALS — BP 130/82 | HR 88 | Temp 99.2°F | Resp 16 | Wt 205.4 lb

## 2018-12-15 DIAGNOSIS — R6889 Other general symptoms and signs: Secondary | ICD-10-CM | POA: Diagnosis not present

## 2018-12-15 DIAGNOSIS — J029 Acute pharyngitis, unspecified: Secondary | ICD-10-CM | POA: Diagnosis not present

## 2018-12-15 DIAGNOSIS — J069 Acute upper respiratory infection, unspecified: Secondary | ICD-10-CM | POA: Diagnosis not present

## 2018-12-15 LAB — POCT INFLUENZA A/B
Influenza A, POC: NEGATIVE
Influenza B, POC: NEGATIVE

## 2018-12-15 LAB — POCT RAPID STREP A (OFFICE): Rapid Strep A Screen: NEGATIVE

## 2018-12-15 NOTE — Progress Notes (Signed)
Chief Complaint  Patient presents with  . possible flu    saturday- sore throat, chills, fever, nausated, headache    Subjective:  Joanne Garrett is a 43 y.o. female who presents for a 3 day history of fever, chills, headache, sore throat, left ear pain, and nausea.   Diarrhea but this is not new.   Denies body aches, rhinorrhea, nasal congestion, chest pain, palpitations, shortness of breath, cough, vomiting, abdominal pain, urinary symptoms.   Treatment to date: 46.  + sick contacts.  No other aggravating or relieving factors.  No other c/o.  ROS as in subjective.   Objective: Vitals:   12/15/18 1331  BP: 130/82  Pulse: 88  Resp: 16  Temp: 99.2 F (37.3 C)  SpO2: 99%    General appearance: Alert, WD/WN, no distress, mildly ill appearing                             Skin: warm, no rash                           Head: no sinus tenderness                            Eyes: conjunctiva normal, corneas clear, PERRLA                            Ears: pearly TMs, external ear canals normal                          Nose: septum midline, turbinates swollen, with erythema and clear discharge             Mouth/throat: MMM, tongue normal, mild pharyngeal erythema                           Neck: supple, no adenopathy, no thyromegaly, nontender                          Heart: RRR, normal S1, S2, no murmurs                         Lungs: CTA bilaterally, no wheezes, rales, or rhonchi      Assessment: Acute URI  Flu-like symptoms - Plan: POCT Influenza A/B  Acute pharyngitis, unspecified etiology - Plan: POCT rapid strep A    Plan: Negative strep and flu.  Discussed diagnosis and treatment of URI. Suggested symptomatic OTC remedies. Nasal saline spray for congestion.  Tylenol or Ibuprofen OTC for fever and malaise.  Call/return in 2-3 days if symptoms aren't resolving.

## 2018-12-15 NOTE — Patient Instructions (Signed)
You are negative for flu and strep.   I suspect your symptoms are related to a viral illness and recommend treating your symptoms at this point.  Drink extra water, use salt water gargles for throat irritation and Tylenol or Ibuprofen for aches and pains.  If you develop cough then you may want to take Mucinex.  Call if you are not improving by days 7-10 of your illness or if you develop fever, wheezing or worsening symptoms.

## 2018-12-18 ENCOUNTER — Encounter: Payer: Self-pay | Admitting: Family Medicine

## 2018-12-18 ENCOUNTER — Telehealth: Payer: Self-pay | Admitting: Family Medicine

## 2018-12-18 DIAGNOSIS — M25562 Pain in left knee: Secondary | ICD-10-CM | POA: Diagnosis not present

## 2018-12-18 NOTE — Telephone Encounter (Signed)
This is fine.  As long as she does not have fever she can go to work tomorrow.  If she is not improving then she will need to be seen again.

## 2018-12-18 NOTE — Telephone Encounter (Signed)
Called and left message that note was ready

## 2018-12-18 NOTE — Telephone Encounter (Signed)
Pt called and states she is not any better she is still running a fever throwing up she has not been able to keep anything down,  States she is going to try some broth and see if she can keep that down, has had the diarrhea, has the chills not been able to get warm, has a headache, she is has not been to work and would like to know if she can get a work to extended until today and she is going to try and go back tomorrow, pt can be reached at [phone]

## 2018-12-22 ENCOUNTER — Ambulatory Visit: Payer: BLUE CROSS/BLUE SHIELD | Admitting: Family Medicine

## 2018-12-30 DIAGNOSIS — M0579 Rheumatoid arthritis with rheumatoid factor of multiple sites without organ or systems involvement: Secondary | ICD-10-CM | POA: Diagnosis not present

## 2018-12-30 DIAGNOSIS — Z79899 Other long term (current) drug therapy: Secondary | ICD-10-CM | POA: Diagnosis not present

## 2019-01-02 ENCOUNTER — Other Ambulatory Visit: Payer: Self-pay | Admitting: Family Medicine

## 2019-01-05 ENCOUNTER — Other Ambulatory Visit (HOSPITAL_COMMUNITY): Payer: Self-pay | Admitting: Psychiatry

## 2019-01-05 MED ORDER — VENLAFAXINE HCL ER 150 MG PO CP24: 150.0000 mg | ORAL_CAPSULE | Freq: Every day | ORAL | 0 refills | Status: DC

## 2019-01-13 ENCOUNTER — Ambulatory Visit: Payer: BLUE CROSS/BLUE SHIELD | Admitting: Family Medicine

## 2019-01-15 ENCOUNTER — Other Ambulatory Visit: Payer: Self-pay | Admitting: Family Medicine

## 2019-01-15 DIAGNOSIS — Z1231 Encounter for screening mammogram for malignant neoplasm of breast: Secondary | ICD-10-CM
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
## 2019-01-22 ENCOUNTER — Other Ambulatory Visit: Payer: Self-pay

## 2019-01-22 ENCOUNTER — Ambulatory Visit (INDEPENDENT_AMBULATORY_CARE_PROVIDER_SITE_OTHER): Payer: BLUE CROSS/BLUE SHIELD | Admitting: Psychiatry

## 2019-01-22 VITALS — BP 140/92 | HR 72 | Ht 63.0 in | Wt 202.0 lb

## 2019-01-22 DIAGNOSIS — G4709 Other insomnia: Secondary | ICD-10-CM | POA: Diagnosis not present

## 2019-01-22 DIAGNOSIS — Z818 Family history of other mental and behavioral disorders: Secondary | ICD-10-CM

## 2019-01-22 DIAGNOSIS — R450 Nervousness: Secondary | ICD-10-CM

## 2019-01-22 DIAGNOSIS — Z811 Family history of alcohol abuse and dependence: Secondary | ICD-10-CM

## 2019-01-22 DIAGNOSIS — F4310 Post-traumatic stress disorder, unspecified: Secondary | ICD-10-CM | POA: Diagnosis not present

## 2019-01-22 DIAGNOSIS — F3341 Major depressive disorder, recurrent, in partial remission: Secondary | ICD-10-CM | POA: Diagnosis not present

## 2019-01-22 MED ORDER — LORAZEPAM 0.5 MG PO TABS: ORAL_TABLET | ORAL | 2 refills | Status: DC

## 2019-01-22 MED ORDER — VENLAFAXINE HCL ER 150 MG PO CP24: 150.0000 mg | ORAL_CAPSULE | Freq: Every day | ORAL | 0 refills | Status: DC

## 2019-01-22 NOTE — Patient Instructions (Signed)
1.Continuevenlafaxine 150 mg daily  2. Continue ativan 0.25-0.5 mg daily as needed for anxiety  3.Return to clinic infour months for 15 mins

## 2019-01-29 DIAGNOSIS — R319 Hematuria, unspecified: Secondary | ICD-10-CM | POA: Diagnosis not present

## 2019-01-29 DIAGNOSIS — I10 Essential (primary) hypertension: Secondary | ICD-10-CM | POA: Diagnosis not present

## 2019-01-29 DIAGNOSIS — E876 Hypokalemia: Secondary | ICD-10-CM | POA: Diagnosis not present

## 2019-01-29 DIAGNOSIS — N200 Calculus of kidney: Secondary | ICD-10-CM | POA: Diagnosis not present

## 2019-02-02 DIAGNOSIS — E876 Hypokalemia: Secondary | ICD-10-CM | POA: Diagnosis not present

## 2019-02-04 DIAGNOSIS — M79672 Pain in left foot: Secondary | ICD-10-CM | POA: Insufficient documentation

## 2019-02-09 ENCOUNTER — Other Ambulatory Visit: Payer: Self-pay

## 2019-02-09 ENCOUNTER — Ambulatory Visit (INDEPENDENT_AMBULATORY_CARE_PROVIDER_SITE_OTHER): Payer: BLUE CROSS/BLUE SHIELD | Admitting: Family Medicine

## 2019-02-09 ENCOUNTER — Encounter: Payer: Self-pay | Admitting: Family Medicine

## 2019-02-09 VITALS — Temp 98.6°F | Wt 201.0 lb

## 2019-02-09 DIAGNOSIS — J029 Acute pharyngitis, unspecified: Secondary | ICD-10-CM | POA: Diagnosis not present

## 2019-02-09 NOTE — Progress Notes (Signed)
   Subjective:    Patient ID: Joanne Garrett, female    DOB: [birth year], 43 y.o.   MRN: [id number]  HPI   Documentation for virtual telephone encounter.  Documentation for virtual audio and video telecommunications through Zoom encounter:   The patient was located at home. The provider was located in the office. The patient did consent to this visit and is aware of possible charges through their insurance for this visit. The other persons participating in this telemedicine service were none. This virtual service is not related to other E/M service within previous 7 days. She complains of a 2-day history of sore throat but no fever, chills, cough, congestion, shortness of breath, headache, decreased appetite.  Smell and taste are normal.  She does have an underlying history of allergies but no present symptoms.   Review of Systems     Objective:   Physical Exam Alert and in no distress with normal voice       Assessment & Plan:  Sore throat I explained that since we are under the coronavirus mandate I recommend she stay home until the throat symptoms have cleared and if she gets any other symptoms as mentioned above she is to call.  She can return to work when the sore throat is gone and she has no other symptoms.  She is to gargle for the throat symptoms.  She was comfortable with this.

## 2019-02-11 ENCOUNTER — Telehealth: Payer: Self-pay | Admitting: Family Medicine

## 2019-02-11 MED ORDER — ALBUTEROL SULFATE HFA 108 (90 BASE) MCG/ACT IN AERS: 2.0000 | INHALATION_SPRAY | Freq: Four times a day (QID) | RESPIRATORY_TRACT | 0 refills | Status: DC | PRN

## 2019-02-11 NOTE — Telephone Encounter (Signed)
Pt called and states that some of her symptoms have changed since her earlier Virtual Visit with J[facility]. She states that her sore throat is better but she is having shortness of breath. She also advised that she continues to self quarantine. Pt can be reached at [phone] and uses [facility] at [facility].

## 2019-02-11 NOTE — Telephone Encounter (Signed)
The sore throat is better but she is having slight SOB that usually occurs this time of year.I will try Proventil and see if that will help

## 2019-02-16 ENCOUNTER — Telehealth: Payer: Self-pay | Admitting: Family Medicine

## 2019-02-16 MED ORDER — AZITHROMYCIN 500 MG PO TABS: 500.0000 mg | ORAL_TABLET | Freq: Every day | ORAL | 0 refills | Status: DC

## 2019-02-16 NOTE — Telephone Encounter (Signed)
Her know that I called an antibiotic in.  Have her let us know if it does not work.

## 2019-02-16 NOTE — Telephone Encounter (Signed)
Pt called, she spoke with you last week.  She is still having a sore throat.  She wants to go back to work, but is concerned she may have strep.  White bumps on tongue not on throat.  Please call pt with advice [phone]

## 2019-02-16 NOTE — Telephone Encounter (Signed)
Pt was advised Joanne Garrett 

## 2019-02-27 ENCOUNTER — Ambulatory Visit: Payer: Self-pay

## 2019-03-03 DIAGNOSIS — M0579 Rheumatoid arthritis with rheumatoid factor of multiple sites without organ or systems involvement: Secondary | ICD-10-CM | POA: Diagnosis not present

## 2019-03-06 DIAGNOSIS — N200 Calculus of kidney: Secondary | ICD-10-CM | POA: Diagnosis not present

## 2019-03-06 DIAGNOSIS — I10 Essential (primary) hypertension: Secondary | ICD-10-CM | POA: Diagnosis not present

## 2019-03-06 DIAGNOSIS — E876 Hypokalemia: Secondary | ICD-10-CM | POA: Diagnosis not present

## 2019-03-06 DIAGNOSIS — R319 Hematuria, unspecified: Secondary | ICD-10-CM | POA: Diagnosis not present

## 2019-03-07 ENCOUNTER — Other Ambulatory Visit: Payer: Self-pay | Admitting: Family Medicine

## 2019-03-09 ENCOUNTER — Telehealth: Payer: Self-pay

## 2019-03-09 ENCOUNTER — Encounter: Payer: Self-pay | Admitting: Physician Assistant

## 2019-03-09 ENCOUNTER — Telehealth: Payer: BLUE CROSS/BLUE SHIELD | Admitting: Physician Assistant

## 2019-03-09 DIAGNOSIS — N390 Urinary tract infection, site not specified: Secondary | ICD-10-CM

## 2019-03-09 DIAGNOSIS — R109 Unspecified abdominal pain: Secondary | ICD-10-CM | POA: Diagnosis not present

## 2019-03-09 NOTE — Progress Notes (Signed)
Based on what you shared with me, I feel your condition warrants further evaluation and I recommend that you be seen for a face to face office visit.   Joanne Garrett,  Your symptoms warrant a face to face evaluation to clarify if your symptoms are related to UTI, kidney infection , or kidney stones.     NOTE: If you entered your credit card information for this eVisit, you will not be charged. You may see a "hold" on your card for the $35 but that hold will drop off and you will not have a charge processed.  If you are having a true medical emergency please call 911.  If you need an urgent face to face visit, [place] has four urgent care centers for your convenience.    PLEASE NOTE: THE [facility] LOCATIONS AND URGENT CARE CLINICS DO NOT HAVE THE TESTING FOR CORONAVIRUS COVID19 AVAILABLE.  IF YOU FEEL YOU NEED THIS TEST YOU MUST GO TO A TRIAGE LOCATION AT [facility]   [email or web address] to reserve your spot online an avoid wait times  [facility] [address] [place], [place] [address] Modified hours of operation: Monday-Friday, 12 PM to 6 PM  Saturday & Sunday 10 AM to 4 PM *Across the street from [place] (New Address!) [address] [place], [place] [address] *Just off [address], across the road from [place] hours of operation: Monday-Friday, 12 PM to 6 PM  Closed Saturday & Sunday  [facility]'s modified hours of operation will be in effect from May 1 until May 31   The following sites will take your insurance:  . [facility] [place] a Provider at this Location  [address] [place], [place] [address] . 10 am to 8 pm Monday-Friday . 12 pm to 8 pm Saturday-Sunday   . [facility] at [place] a Provider at this  Location  [address] [place], [place], [place] [address] . 8 am to 8 pm Monday-Friday . 9 am to 6 pm Saturday . 11 am to 6 pm Sunday   . [facility] at [place] Get Driving Directions  [address] [place], [place] [address] . 8 am to 8 pm Monday-Friday . 8 am to 4 pm Saturday-Sunday   Your e-visit answers were reviewed by a board certified advanced clinical practitioner to complete your personal care plan.  Thank you for using e-Visits. I have spent 7 min in completion and review of this note- Lacy Duverney [facility]

## 2019-03-09 NOTE — Telephone Encounter (Signed)
She needs a virtual visit. Overdue for labs as well it seems. Ok to schedule for this week sometime.

## 2019-03-09 NOTE — Telephone Encounter (Signed)
Is this okay to refill? 

## 2019-03-09 NOTE — Telephone Encounter (Signed)
Left detailed message for pt to call back to schedule virtual visit

## 2019-03-09 NOTE — Telephone Encounter (Signed)
I contacted the pt and left a vm in regards to her 03/10/19 appt. Trying to verify if pt would be willing to do a virtual video visit.

## 2019-03-10 ENCOUNTER — Ambulatory Visit: Payer: BLUE CROSS/BLUE SHIELD | Admitting: Neurology

## 2019-03-11 ENCOUNTER — Other Ambulatory Visit: Payer: Self-pay

## 2019-03-11 ENCOUNTER — Encounter: Payer: Self-pay | Admitting: Family Medicine

## 2019-03-11 ENCOUNTER — Ambulatory Visit (INDEPENDENT_AMBULATORY_CARE_PROVIDER_SITE_OTHER): Payer: BLUE CROSS/BLUE SHIELD | Admitting: Family Medicine

## 2019-03-11 VITALS — Temp 98.6°F | Wt 203.0 lb

## 2019-03-11 DIAGNOSIS — R300 Dysuria: Secondary | ICD-10-CM | POA: Diagnosis not present

## 2019-03-11 DIAGNOSIS — R350 Frequency of micturition: Secondary | ICD-10-CM

## 2019-03-11 DIAGNOSIS — R3915 Urgency of urination: Secondary | ICD-10-CM | POA: Diagnosis not present

## 2019-03-11 MED ORDER — NITROFURANTOIN MONOHYD MACRO 100 MG PO CAPS: 100.0000 mg | ORAL_CAPSULE | Freq: Two times a day (BID) | ORAL | 0 refills | Status: DC

## 2019-03-11 NOTE — Progress Notes (Signed)
   Subjective:   Documentation for virtual audio and video telecommunications through [place] encounter:  The patient was located at home. 2 patient identifiers used.  The provider was located in the office. The patient did consent to this visit and is aware of possible charges through their insurance for this visit.  The other persons participating in this telemedicine service were none.     Patient ID: Joanne Garrett, female    DOB: 1976/03/24, 43 y.o.   MRN: [id number]  HPI Chief Complaint  Patient presents with  . bladder infection    urgency,burning before urinating, pain at right lower quadant. symptoms started last thursday.    Complains of 7 day history of urinary urgency, frequency, dysuria and some leakage. Right lower quadrant pain. Thinks this is a UTI. Denies recurrent UTI history or pyelonephritis.   Denies fever, chills, chest pain, N/V/D, back pain, vaginal discharge.   Started on new RA medication last week, Simponi infusion.  Reviewed allergies, medications, past medical, surgical, family, and social history.    Review of Systems Pertinent positives and negatives in the history of present illness.     Objective:   Physical Exam Temp 98.6 F (37 C) (Oral)   Wt 203 lb (92.1 kg)   LMP 06/11/2013   BMI 35.96 kg/m   Alert and oriented and in no acute distress.  Normal speech, mood and thought process.      Assessment & Plan:  Urinary urgency - Plan: nitrofurantoin, macrocrystal-monohydrate, (MACROBID) 100 MG capsule  Discussed limitations of virtual visit. She is nontoxic-appearing. Discussed the options of having her come in to leave a urine specimen versus empirically treating her for UTI.  She would prefer that we go ahead and treat her with an antibiotic and if she is not back to baseline or if her symptoms worsen she will follow-up.  Time spent on call was 14 minutes and in review of previous records 1 minutes total.  This virtual service is  not related to other E/M service within previous 7 days.

## 2019-04-09 ENCOUNTER — Telehealth: Payer: Self-pay | Admitting: Neurology

## 2019-04-09 DIAGNOSIS — M0579 Rheumatoid arthritis with rheumatoid factor of multiple sites without organ or systems involvement: Secondary | ICD-10-CM | POA: Diagnosis not present

## 2019-04-09 DIAGNOSIS — Z79899 Other long term (current) drug therapy: Secondary | ICD-10-CM | POA: Diagnosis not present

## 2019-04-09 NOTE — Telephone Encounter (Signed)
Due to current COVID 19 pandemic, our office is severely reducing in office visits until further notice, in order to minimize the risk to our patients and healthcare providers.   Called patient and confirmed a virtual visit for her 6/10 appointment. Patient verbalized understanding of the doxy.me process and I have sent her an e-mail with link and directions as well as my name and office number/hours for reference. Patient understands that she will receive a call from RN to update chart.  Pt understands that although there may be some limitations with this type of visit, we will take all precautions to reduce any security or privacy concerns.  Pt understands that this will be treated like an in office visit and we will file with pt's insurance, and there may be a patient responsible charge related to this service.

## 2019-04-14 NOTE — Telephone Encounter (Signed)
I contacted the pt and left a vm asking her to call me back so we could complete the pre charting for 04/15/19 vv.

## 2019-04-15 ENCOUNTER — Other Ambulatory Visit: Payer: Self-pay

## 2019-04-15 ENCOUNTER — Ambulatory Visit (INDEPENDENT_AMBULATORY_CARE_PROVIDER_SITE_OTHER): Payer: BC Managed Care – PPO | Admitting: Neurology

## 2019-04-15 ENCOUNTER — Encounter: Payer: Self-pay | Admitting: Neurology

## 2019-04-15 DIAGNOSIS — G500 Trigeminal neuralgia: Secondary | ICD-10-CM | POA: Diagnosis not present

## 2019-04-15 HISTORY — DX: Trigeminal neuralgia: G50.0

## 2019-04-15 MED ORDER — GABAPENTIN 600 MG PO TABS: 600.0000 mg | ORAL_TABLET | Freq: Two times a day (BID) | ORAL | 3 refills | Status: DC

## 2019-04-15 NOTE — Progress Notes (Signed)
     Virtual Visit via Video Note  I connected with Joanne Garrett on 04/15/19 at  8:00 AM EDT by a video enabled telemedicine application and verified that I am speaking with the correct person using two identifiers.  Location: Patient: The patient is at home. Provider: Physician in office.   I discussed the limitations of evaluation and management by telemedicine and the availability of in person appointments. The patient expressed understanding and agreed to proceed.  History of Present Illness: Joanne Garrett is a 43 year old right-handed black female with a history of rheumatoid arthritis, and a history of posttraumatic stress disorder.  The patient is being followed through this office for right-sided facial pain in the V2 distribution.  This may represent trigeminal neuralgia, but the patient does report some occasional watering of the right eye, and a SUNCT type headache may need to be considered as well.  At any rate, the patient seems to have gained benefit with the use of gabapentin, she currently is taking 600 mg twice daily and tolerates the medication well.  She has reported memory issues in the past but she claims that this is doing better for her.  She will be going for a sleep study in July 2020.  The patient does have some difficulty getting to sleep at night at times.  She used melatonin but this did not really help much.  She overall claims that she feels good.  She is followed through psychiatry for posttraumatic stress disorder.   Observations/Objective: The virtual evaluation reveals that the patient is alert and cooperative.  She is obese.  She has good facial symmetry, she is able to protrude the tongue in the midline with good lateral movement of the tongue.  Extraocular movements are full.  The patient has good finger-nose-finger and heel shin bilaterally.  Gait is normal.  Tandem gait is normal.  Romberg is negative.  No drift is seen.  Speech is well enunciated,  not aphasic or dysarthric.  Assessment and Plan: 1.  Right facial pain, trigeminal neuralgia, V2 distribution  2.  Reports of mild memory disturbance  3.  Posttraumatic stress disorder  The patient will be continued on the gabapentin, I will send a prescription for the 600 mg tablets taking 1 twice daily.  She will call for any dose adjustments if needed.  She still has occasional twinges of pain, the last episode was 3 weeks ago.  Overall, she has done much better with her right facial pain.  She will follow-up here in 6 months.  Follow Up Instructions: 51-month follow-up, may see nurse practitioner.   I discussed the assessment and treatment plan with the patient. The patient was provided an opportunity to ask questions and all were answered. The patient agreed with the plan and demonstrated an understanding of the instructions.   The patient was advised to call back or seek an in-person evaluation if the symptoms worsen or if the condition fails to improve as anticipated.  I provided 15 minutes of non-face-to-face time during this encounter.   Kathrynn Ducking, MD
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
## 2019-05-20 ENCOUNTER — Encounter: Payer: Self-pay | Admitting: Internal Medicine

## 2019-05-20 ENCOUNTER — Ambulatory Visit (INDEPENDENT_AMBULATORY_CARE_PROVIDER_SITE_OTHER): Payer: BC Managed Care – PPO | Admitting: Internal Medicine

## 2019-05-20 ENCOUNTER — Other Ambulatory Visit: Payer: Self-pay

## 2019-05-20 VITALS — BP 120/90 | HR 62 | Temp 98.4°F | Ht 63.0 in | Wt 207.4 lb

## 2019-05-20 DIAGNOSIS — F5101 Primary insomnia: Secondary | ICD-10-CM

## 2019-05-20 DIAGNOSIS — G4733 Obstructive sleep apnea (adult) (pediatric): Secondary | ICD-10-CM

## 2019-05-20 DIAGNOSIS — G4700 Insomnia, unspecified: Secondary | ICD-10-CM | POA: Insufficient documentation

## 2019-05-20 DIAGNOSIS — R0683 Snoring: Secondary | ICD-10-CM | POA: Diagnosis not present

## 2019-05-20 NOTE — Assessment & Plan Note (Signed)
History suggests a primary insomnia problem in addition to sleep apnea. She liked working 3rd shift, suggesting a weaker link of body clock to circadian schedule than normal. There have been some modd disturbances in the past as well. Plan- We may be able to help with this, once the sleep study result is available.

## 2019-05-20 NOTE — Patient Instructions (Signed)
Order- pease schedule unattended home sleep test     Dx OSA  Please call about 2 weeks after your sleep test to see if results and recommendations are ready. If appropriate, we may be able to start treatment before we see you next.

## 2019-05-20 NOTE — Progress Notes (Signed)
05/20/2019- 54 yoF for sleep evaluation. referred by PCP & Dr. Johnney Ou for snoring, no historical sleep study or CPAP use Medical problem list includes Hx Breast Ca/ XRT, Anxiety/ Depression, L Thyroid nodule, Rheumatoid Arthritis, R Tigeminal Neuralgia,  Body weight today 207 lbs Epworth score 5 She describes difficulty initiating and maintaining sleep. Told of loud snoring. Tired/ dragging through most days- no naps. Worked 3rd shift x 5 years, 8 yrs ago and liked it. Sleeps best in daytime if not working at [employer]. Never a good sleeper. Has tried melatonin, ambien, zaleplon in past. Occasional Dr Malachi Bonds, little coffee. Denies ENT surgery  Prior to Admission medications   Medication Sig Start Date End Date Taking? Authorizing Provider  acetaminophen (TYLENOL) 500 MG tablet Take 500 mg by mouth every 6 (six) hours as needed.   Yes [provider]  albuterol (PROVENTIL HFA;VENTOLIN HFA) 108 (90 Base) MCG/ACT inhaler Inhale 2 puffs into the lungs every 6 (six) hours as needed for wheezing or shortness of breath. 02/11/19  Yes Denita Lung, MD  amLODipine (NORVASC) 10 MG tablet TAKE 1 TABLET BY MOUTH DAILY 01/02/19  Yes Henson, Vickie L, NP-C  folic acid (FOLVITE) 1 MG tablet Take 1 mg by mouth daily. 10/20/18  Yes [provider]  gabapentin (NEURONTIN) 600 MG tablet Take 1 tablet (600 mg total) by mouth 2 (two) times daily. 04/15/19  Yes Kathrynn Ducking, MD  golimumab (Platter ARIA) 50 MG/4ML SOLN injection Inject into the vein. 03/03/19  Yes [provider]  LORazepam (ATIVAN) 0.5 MG tablet 0.25-0.5 mg daily as needed for anxiety 01/22/19  Yes Hisada, Elie Goody, MD  meclizine (ANTIVERT) 25 MG tablet Take 1 tablet (25 mg total) by mouth 2 (two) times daily as needed for dizziness. 08/25/18  Yes Henson, Vickie L, NP-C  methotrexate (50 MG/ML) 1 G injection Inject 40 mg into the vein once a week.    Yes [provider]  Multiple Vitamin (MULTIVITAMIN) tablet Take 1  tablet by mouth daily.   Yes [provider]  nitrofurantoin, macrocrystal-monohydrate, (MACROBID) 100 MG capsule Take 1 capsule (100 mg total) by mouth 2 (two) times daily. 03/11/19  Yes Henson, Vickie L, NP-C  ondansetron (ZOFRAN) 4 MG tablet Take 1 tablet (4 mg total) by mouth every 8 (eight) hours as needed for nausea or vomiting. 10/17/18  Yes Kathrynn Ducking, MD  oxybutynin (DITROPAN) 5 MG tablet TK 1 T PO Q 8 H PRF BLADDER SPASM 01/09/18  Yes [provider]  Peppermint Oil (IBGARD PO) Take by mouth.   Yes [provider]  potassium chloride SA (K-DUR) 20 MEQ tablet TAKE 2 TABLETS BY MOUTH FOR 7 DAYS THEN 1 TABLET DAILY 03/09/19  Yes Henson, Vickie L, NP-C  Probiotic Product (RESTORA PO) Take by mouth.   Yes [provider]  valACYclovir (VALTREX) 1000 MG tablet Take 1 tablet (1,000 mg total) by mouth 2 (two) times daily as needed. X 5 days during an outbreak 08/15/18  Yes Henson, Vickie L, NP-C  venlafaxine XR (EFFEXOR-XR) 150 MG 24 hr capsule Take 1 capsule (150 mg total) by mouth daily. 04/03/19 07/02/19 Yes Norman Clay, MD   Past Medical History:  Diagnosis Date  . Anxiety   . 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 Depression   . Genital herpes   . Hematuria   .  History of hyperthyroidism 02/2001   s/p  RAI 03-2001  . History of panic attacks   . History of radiation therapy    completed 09-23-2012  50Gy in 25 fractions and boost completed [id number]  . History of thyroid nodule   . Hypertension   . Renal calculus, right   . Rheumatoid arthritis ([place])   . Trigeminal neuralgia of right side of face 04/15/2019   V2  . Urgency of urination    Past Surgical History:  Procedure Laterality Date  . ANTERIOR CERVICAL DECOMP/DISCECTOMY FUSION  05-18-2015   [facility]  . BREAST LUMPECTOMY Left 07-03-2012     [facility]  . CYSTOSCOPY WITH RETROGRADE PYELOGRAM, URETEROSCOPY AND STENT PLACEMENT Right 09/09/2017   Procedure: CYSTOSCOPY WITH RIGHT RETROGRADE PYELOGRAM, URETEROSCOPY , STONE BASKETRY AND STENT PLACEMENT;  Surgeon: Lucas Mallow, MD;  Location: [facility];  Service: Urology;  Laterality: Right;  . KNEE ARTHROSCOPY Left 2009  . TOTAL ABDOMINAL HYSTERECTOMY W/ BILATERAL SALPINGOOPHORECTOMY  2014   Family History  Problem Relation Age of Onset  . Hypertension Mother   . COPD Mother   . Depression Mother   . Hypertension Father   . Kidney disease Father   . Alcohol abuse Father   . Hypertension Brother   . Alzheimer's disease Paternal Grandmother    Social History   Socioeconomic History  . Marital status: Single    Spouse name: Not on file  . Number of children: Not on file  . Years of education: Not on file  . Highest education level: Not on file  Occupational History  . Not on file  Social Needs  . Financial resource strain: Not on file  . Food insecurity    Worry: Not on file    Inability: Not on file  . Transportation needs    Medical: Not on file    Non-medical: Not on file  Tobacco Use  . Smoking status: Never Smoker  . Smokeless tobacco: Never Used  Substance and Sexual Activity  . Alcohol use: No  . Drug use: No  . Sexual activity: Not Currently    Birth control/protection: Surgical  Lifestyle  . Physical activity    Days per week: Not on file    Minutes per session: Not on file  . Stress: Not on file  Relationships  . Social Herbalist on phone: Not on file    Gets together: Not on file    Attends religious service: Not on file    Active member of club or organization: Not on file    Attends meetings of clubs or organizations: Not on file    Relationship status: Not on file  . Intimate partner violence    Fear of current or ex partner: Not on file    Emotionally abused: Not on file    Physically abused: Not on file     Forced sexual activity: Not on file  Other Topics Concern  . Not on file  Social History Narrative  . Not on file   ROS-see HPI   += positive Constitutional:    weight loss, night sweats, fevers, chills, fatigue, lassitude. HEENT:    headaches, difficulty swallowing, tooth/dental problems, sore throat,       sneezing, itching, ear ache, nasal congestion, post nasal drip, snoring CV:    chest pain, orthopnea, PND, swelling in lower extremities, anasarca,  Dizziness,+ palpitations Resp:   +shortness of breath with exertion or at rest.                productive cough,   non-productive cough, coughing up of blood.              change in color of mucus.  wheezing.   Skin:    rash or lesions. GI:  No-   heartburn, indigestion, abdominal pain, nausea, vomiting, diarrhea,                 change in bowel habits, loss of appetite GU: dysuria, change in color of urine, no urgency or frequency.   flank pain. MS:  + joint pain, stiffness, decreased range of motion, back pain. Neuro-     nothing unusual Psych:  change in mood or affect.  depression or +anxiety.   memory loss.  OBJ- Physical Exam General- Alert, Oriented, Affect-appropriate, Distress- none acute, + obese Skin- rash-none, lesions- none, excoriation- none Lymphadenopathy- none Head- atraumatic            Eyes- Gross vision intact, PERRLA, conjunctivae and secretions clear            Ears- Hearing, canals-normal            Nose- Clear, no-Septal dev, mucus, polyps, erosion, perforation             Throat- Mallampati IV , mucosa clear , drainage- none, tonsils- atrophic Neck- flexible , trachea midline, no stridor , thyroid nl, carotid no bruit Chest - symmetrical excursion , unlabored           Heart/CV- RRR , no murmur , no gallop  , no rub, nl s1 s2                           - JVD- none , edema- none, stasis changes- none, varices- none           Lung- clear to P&A, wheeze- none, cough- none ,  dullness-none, rub- none           Chest wall-  Abd-  Br/ Gen/ Rectal- Not done, not indicated Extrem- cyanosis- none, clubbing, none, atrophy- none, strength- nl Neuro- grossly intact to observation

## 2019-05-20 NOTE — Assessment & Plan Note (Signed)
Likely to have obstructive sleep apnea. We discussed the basics of sleep hygiene, diagnosis, medical concerns and treatment options. Plan- Sleep study, then likely CPAP

## 2019-05-25 ENCOUNTER — Ambulatory Visit (INDEPENDENT_AMBULATORY_CARE_PROVIDER_SITE_OTHER): Payer: BC Managed Care – PPO | Admitting: Psychiatry

## 2019-05-25 ENCOUNTER — Other Ambulatory Visit: Payer: Self-pay

## 2019-05-25 ENCOUNTER — Telehealth (HOSPITAL_COMMUNITY): Payer: Self-pay | Admitting: Psychiatry

## 2019-05-25 ENCOUNTER — Encounter (HOSPITAL_COMMUNITY): Payer: Self-pay | Admitting: Psychiatry

## 2019-05-25 ENCOUNTER — Encounter (HOSPITAL_COMMUNITY): Payer: BLUE CROSS/BLUE SHIELD | Admitting: Psychiatry

## 2019-05-25 DIAGNOSIS — F330 Major depressive disorder, recurrent, mild: Secondary | ICD-10-CM

## 2019-05-25 DIAGNOSIS — F4310 Post-traumatic stress disorder, unspecified: Secondary | ICD-10-CM | POA: Diagnosis not present

## 2019-05-25 MED ORDER — VENLAFAXINE HCL ER 75 MG PO CP24: ORAL_CAPSULE | ORAL | 0 refills | Status: DC

## 2019-05-25 MED ORDER — VENLAFAXINE HCL ER 150 MG PO CP24: ORAL_CAPSULE | ORAL | 0 refills | Status: DC

## 2019-05-25 NOTE — Patient Instructions (Signed)
1.Increasevenlafaxine 225 mg daily  2. Continue ativan 0.25-0.5 mg daily as needed for anxiety  3.Next appointment: 9/18 at 8:20

## 2019-05-25 NOTE — Progress Notes (Signed)
This encounter was created in error - please disregard.
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
## 2019-05-25 NOTE — Telephone Encounter (Signed)
Contacted the patient. She stated that she tried to get into link, which did not work. She is advised to try it again to see if it works. She did not sign in. Although this note writer called her at least a few times afterwards, she did not answer the phone or automatically connected to voice message. Left voice message to contact the office.

## 2019-05-26 ENCOUNTER — Telehealth: Payer: Self-pay | Admitting: Family Medicine

## 2019-05-26 NOTE — Telephone Encounter (Signed)
Dismissal letter in guarantor snapshot  °

## 2019-05-28 ENCOUNTER — Ambulatory Visit (HOSPITAL_COMMUNITY): Payer: BC Managed Care – PPO | Admitting: Psychiatry

## 2019-05-29 ENCOUNTER — Telehealth: Payer: Self-pay | Admitting: Internal Medicine

## 2019-05-29 NOTE — Telephone Encounter (Signed)
Called and spoke with pt. Stated to pt that we would check with [facility] to see if they might have an update on when they will be able to get her in for the HST appt and once we had more info, stated to her that we would call her back.  [facility], please advise on this for pt. Thanks!

## 2019-05-29 NOTE — Telephone Encounter (Signed)
Spoke to pt & made her aware we do have her sleep study.  There are a few pt's ahead of her to be called but she should be hearing from one of [place] next week.  Pt stated ok - she just wasn't aware of the turn around time.  Nothing further needed.

## 2019-06-10 DIAGNOSIS — M0579 Rheumatoid arthritis with rheumatoid factor of multiple sites without organ or systems involvement: Secondary | ICD-10-CM | POA: Diagnosis not present

## 2019-06-15 ENCOUNTER — Other Ambulatory Visit: Payer: Self-pay | Admitting: Family Medicine

## 2019-06-19 ENCOUNTER — Ambulatory Visit: Payer: BC Managed Care – PPO

## 2019-06-19 ENCOUNTER — Other Ambulatory Visit: Payer: Self-pay

## 2019-06-19 DIAGNOSIS — G4733 Obstructive sleep apnea (adult) (pediatric): Secondary | ICD-10-CM

## 2019-06-20 DIAGNOSIS — G4733 Obstructive sleep apnea (adult) (pediatric): Secondary | ICD-10-CM | POA: Diagnosis not present

## 2019-06-26 DIAGNOSIS — G4733 Obstructive sleep apnea (adult) (pediatric): Secondary | ICD-10-CM | POA: Diagnosis not present

## 2019-07-15 ENCOUNTER — Telehealth: Payer: Self-pay | Admitting: Neurology

## 2019-07-15 MED ORDER — CARBAMAZEPINE 100 MG PO CHEW: 100.0000 mg | CHEWABLE_TABLET | Freq: Two times a day (BID) | ORAL | 1 refills | Status: DC

## 2019-07-15 NOTE — Telephone Encounter (Signed)
I called the patient.  The patient has had some recent worsening of her facial pain and some pain with swallowing, sensation of decreased hearing in the right ear, she has been seen by ENT for this.  She has a sensation of swelling in the ear and in the throat.  She cannot take higher doses of the gabapentin, this makes her drowsy during the day, she is on 600 mg twice daily.  I will add low-dose carbamazepine to the regimen.

## 2019-07-15 NOTE — Telephone Encounter (Signed)
Pt states the current does of Gabapentin is not helping her Trigeminal neuralgia.  Please call to discuss something stronger for her to take

## 2019-07-15 NOTE — Addendum Note (Signed)
Addended by: Kathrynn Ducking on: 07/15/2019 02:09 PM   Modules accepted: Orders
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
## 2019-07-24 ENCOUNTER — Other Ambulatory Visit: Payer: Self-pay

## 2019-07-24 ENCOUNTER — Encounter (HOSPITAL_COMMUNITY): Payer: Self-pay | Admitting: Psychiatry

## 2019-07-24 ENCOUNTER — Ambulatory Visit (INDEPENDENT_AMBULATORY_CARE_PROVIDER_SITE_OTHER): Payer: BC Managed Care – PPO | Admitting: Psychiatry

## 2019-07-24 DIAGNOSIS — F4310 Post-traumatic stress disorder, unspecified: Secondary | ICD-10-CM | POA: Diagnosis not present

## 2019-07-24 DIAGNOSIS — F3341 Major depressive disorder, recurrent, in partial remission: Secondary | ICD-10-CM

## 2019-07-24 MED ORDER — VENLAFAXINE HCL ER 150 MG PO CP24: ORAL_CAPSULE | ORAL | 1 refills | Status: DC

## 2019-07-24 MED ORDER — VENLAFAXINE HCL ER 75 MG PO CP24: ORAL_CAPSULE | ORAL | 1 refills | Status: DC

## 2019-07-29 DIAGNOSIS — E876 Hypokalemia: Secondary | ICD-10-CM | POA: Diagnosis not present

## 2019-07-29 DIAGNOSIS — N200 Calculus of kidney: Secondary | ICD-10-CM | POA: Diagnosis not present

## 2019-07-29 DIAGNOSIS — R319 Hematuria, unspecified: Secondary | ICD-10-CM | POA: Diagnosis not present

## 2019-07-29 DIAGNOSIS — I10 Essential (primary) hypertension: Secondary | ICD-10-CM | POA: Diagnosis not present

## 2019-07-30 MED ORDER — CARBAMAZEPINE 200 MG PO TABS: 200.0000 mg | ORAL_TABLET | Freq: Two times a day (BID) | ORAL | 3 refills | Status: DC

## 2019-07-30 NOTE — Telephone Encounter (Signed)
I called the patient, she is still having some facial pain, we will go up on the carbamazepine taking 200 mg twice daily, if she is no better within the next week or so, she is to contact our office and we will consider getting blood levels and then going up higher on the dose.

## 2019-07-30 NOTE — Addendum Note (Signed)
Addended by: Kathrynn Ducking on: 07/30/2019 12:49 PM   Modules accepted: Orders

## 2019-07-30 NOTE — Telephone Encounter (Signed)
Pt has called to inform Trigeminal neuralgia is not any better, she is worse.  Pt is asking for a call from Dr Jannifer Franklin

## 2019-08-05 DIAGNOSIS — M0579 Rheumatoid arthritis with rheumatoid factor of multiple sites without organ or systems involvement: Secondary | ICD-10-CM | POA: Diagnosis not present

## 2019-08-21 DIAGNOSIS — M19072 Primary osteoarthritis, left ankle and foot: Secondary | ICD-10-CM | POA: Diagnosis not present

## 2019-08-23 ENCOUNTER — Other Ambulatory Visit: Payer: Self-pay

## 2019-08-23 ENCOUNTER — Ambulatory Visit (INDEPENDENT_AMBULATORY_CARE_PROVIDER_SITE_OTHER): Payer: BC Managed Care – PPO

## 2019-08-23 ENCOUNTER — Encounter (HOSPITAL_COMMUNITY): Payer: Self-pay

## 2019-08-23 ENCOUNTER — Ambulatory Visit (HOSPITAL_COMMUNITY)
Admission: EM | Admit: 2019-08-23 | Discharge: 2019-08-23 | Disposition: A | Discharge status: Home / Self Care | Payer: BC Managed Care – PPO | Attending: Emergency Medicine | Admitting: Emergency Medicine

## 2019-08-23 DIAGNOSIS — R05 Cough: Secondary | ICD-10-CM

## 2019-08-23 DIAGNOSIS — Z1159 Encounter for screening for other viral diseases: Secondary | ICD-10-CM

## 2019-08-23 DIAGNOSIS — M069 Rheumatoid arthritis, unspecified: Secondary | ICD-10-CM | POA: Diagnosis not present

## 2019-08-23 DIAGNOSIS — Z6830 Body mass index (BMI) 30.0-30.9, adult: Secondary | ICD-10-CM | POA: Diagnosis not present

## 2019-08-23 DIAGNOSIS — G4700 Insomnia, unspecified: Secondary | ICD-10-CM | POA: Insufficient documentation

## 2019-08-23 DIAGNOSIS — J209 Acute bronchitis, unspecified: Secondary | ICD-10-CM

## 2019-08-23 DIAGNOSIS — Z20828 Contact with and (suspected) exposure to other viral communicable diseases: Secondary | ICD-10-CM | POA: Insufficient documentation

## 2019-08-23 DIAGNOSIS — Z79899 Other long term (current) drug therapy: Secondary | ICD-10-CM | POA: Insufficient documentation

## 2019-08-23 DIAGNOSIS — F419 Anxiety disorder, unspecified: Secondary | ICD-10-CM | POA: Insufficient documentation

## 2019-08-23 DIAGNOSIS — A6000 Herpesviral infection of urogenital system, unspecified: Secondary | ICD-10-CM | POA: Diagnosis not present

## 2019-08-23 DIAGNOSIS — I10 Essential (primary) hypertension: Secondary | ICD-10-CM | POA: Diagnosis not present

## 2019-08-23 DIAGNOSIS — R059 Cough, unspecified: Secondary | ICD-10-CM

## 2019-08-23 DIAGNOSIS — R0602 Shortness of breath: Secondary | ICD-10-CM

## 2019-08-23 DIAGNOSIS — F4310 Post-traumatic stress disorder, unspecified: Secondary | ICD-10-CM | POA: Diagnosis not present

## 2019-08-23 DIAGNOSIS — E669 Obesity, unspecified: Secondary | ICD-10-CM | POA: Diagnosis not present

## 2019-08-23 MED ORDER — BENZONATATE 200 MG PO CAPS: 200.0000 mg | ORAL_CAPSULE | Freq: Three times a day (TID) | ORAL | 0 refills | Status: AC | PRN

## 2019-08-23 MED ORDER — AZITHROMYCIN 250 MG PO TABS: 250.0000 mg | ORAL_TABLET | Freq: Every day | ORAL | 0 refills | Status: DC

## 2019-08-23 MED ORDER — ALBUTEROL SULFATE HFA 108 (90 BASE) MCG/ACT IN AERS: 1.0000 | INHALATION_SPRAY | Freq: Four times a day (QID) | RESPIRATORY_TRACT | 0 refills | Status: DC | PRN

## 2019-08-23 MED ORDER — PREDNISONE 20 MG PO TABS: 20.0000 mg | ORAL_TABLET | Freq: Two times a day (BID) | ORAL | 0 refills | Status: AC

## 2019-08-23 NOTE — Discharge Instructions (Signed)
Please use albuterol inhaler 1 to 2 puffs every 4-6 hours as needed for shortness of breath Please also begin prednisone 20 mg twice daily for the next 5 days, please take with food Begin azithromycin-2 tablets today, 1 tablet for the following 4 days Tessalon every 8 hours as needed for cough Please continue to rest and drink plenty of fluids COVID swab pending  Please follow-up if developing worsening shortness of breath, chest pain, dizziness, lightheadedness, fevers, leg pain or leg swelling

## 2019-08-23 NOTE — ED Triage Notes (Signed)
Pt  Present SOB , symptoms started two days ago. Pt states that she has difficulty breathing when she walks or taking deep breaths.

## 2019-08-23 NOTE — ED Provider Notes (Signed)
[place]    CSN: [id number] Arrival date & time: 08/23/19  1121      History   Chief Complaint Chief Complaint  Patient presents with   Shortness of Breath    HPI Joanne Garrett is a 43 y.o. female history of breast cancer, hypertension, rheumatoid arthritis, presenting today for evaluation of cough and shortness of breath.  Over the past week she has had a cough.  Cough has been productive. Over the past 2 days she has developed worsening shortness of breath.  She has difficulty breathing and catching her breath with walking or talking.  She denies associated chest pain.  Initially had some mild throat itching and ear discomfort, but initially attributed symptoms to allergy/season changes.  She denies any fevers chills or body aches.  Has maintained normal appetite.  She denies any nausea vomiting or abdominal pain.  She denies any close sick contacts.  Denies any known exposure to COVID.  She has tried taking Benadryl, Delsym and TheraFlu.  Denies history of diabetes, tobacco use.  Denies previous DVT/PE.  Denies leg pain or leg swelling.  Denies birth control/estrogen use.  Denies recent travel or immobilization.    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 and completed radiation 09-30-2012 and completed anti-estrogen therapy   Depression    Genital herpes    Hematuria    History of hyperthyroidism 02/2001   s/p  RAI 03-2001   History of panic attacks    History of radiation therapy    completed 09-23-2012  50Gy in 25 fractions and boost completed [phone]   History of thyroid nodule    Hypertension    Renal calculus, right    Rheumatoid arthritis ([place])    Trigeminal neuralgia of right side of face 04/15/2019   V2   Urgency of urination     Patient Active Problem List   Diagnosis Date Noted    Snoring 05/20/2019   Insomnia 05/20/2019   Trigeminal neuralgia of right side of face 04/15/2019   MDD (major depressive disorder), recurrent, in partial remission ([place]) 01/22/2019   Obesity (BMI 30.0-34.9) 01/31/2018   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   Rheumatoid arthritis ([place])    PTSD (post-traumatic stress disorder) 09/17/2017   MDD (major depressive disorder), recurrent episode, moderate ([place]) 09/17/2017   HTN (hypertension) 01/16/2017   Genital herpes 01/16/2017   History of thyroid disorder 01/16/2017   Left thyroid nodule 01/16/2017   Breast pain, left 01/16/2017   History of breast cancer 01/16/2017    Past Surgical History:  Procedure Laterality Date   ANTERIOR CERVICAL DECOMP/DISCECTOMY FUSION  05-18-2015   [facility]   BREAST LUMPECTOMY Left 07-03-2012    [facility]   CYSTOSCOPY WITH RETROGRADE PYELOGRAM, URETEROSCOPY AND STENT PLACEMENT Right 09/09/2017   Procedure: CYSTOSCOPY WITH RIGHT RETROGRADE PYELOGRAM, URETEROSCOPY , STONE BASKETRY AND STENT PLACEMENT;  Surgeon: Lucas Mallow, MD;  Location: [facility];  Service: Urology;  Laterality: Right;   KNEE ARTHROSCOPY Left 2009   TOTAL ABDOMINAL HYSTERECTOMY W/ BILATERAL SALPINGOOPHORECTOMY  2014    OB History   No obstetric history on file.      Home Medications    Prior to Admission medications   Medication Sig Start Date End Date Taking? Authorizing Provider  acetaminophen (TYLENOL) 500 MG tablet Take 500 mg  by mouth every 6 (six) hours as needed.    [provider]  albuterol (VENTOLIN HFA) 108 (90 Base) MCG/ACT inhaler Inhale 1-2 puffs into the lungs every 6 (six) hours as needed for wheezing or shortness of breath. 08/23/19   Keilyn Haggard C, [name]  amLODipine (NORVASC) 10 MG tablet TAKE 1 TABLET BY MOUTH DAILY 01/02/19   Henson, Vickie L, NP-C  azithromycin (ZITHROMAX) 250 MG tablet Take 1 tablet (250 mg total) by mouth daily. Take first 2 tablets together,  then 1 every day until finished. 08/23/19   Beretta Ginsberg C, [name]  benzonatate (TESSALON) 200 MG capsule Take 1 capsule (200 mg total) by mouth 3 (three) times daily as needed for up to 7 days for cough. 08/23/19 08/30/19  Senan Urey C, [name]  carbamazepine (TEGRETOL) 200 MG tablet Take 1 tablet (200 mg total) by mouth 2 (two) times daily. 07/30/19   Kathrynn Ducking, MD  folic acid (FOLVITE) 1 MG tablet Take 1 mg by mouth daily. 10/20/18   [provider]  gabapentin (NEURONTIN) 600 MG tablet Take 1 tablet (600 mg total) by mouth 2 (two) times daily. 04/15/19   Kathrynn Ducking, MD  golimumab ([place] ARIA) 50 MG/4ML SOLN injection Inject into the vein. 03/03/19   [provider]  LORazepam (ATIVAN) 0.5 MG tablet 0.25-0.5 mg daily as needed for anxiety 01/22/19   Norman Clay, MD  meclizine (ANTIVERT) 25 MG tablet Take 1 tablet (25 mg total) by mouth 2 (two) times daily as needed for dizziness. 08/25/18   Henson, Vickie L, NP-C  methotrexate (50 MG/ML) 1 G injection Inject 40 mg into the vein once a week.     [provider]  Multiple Vitamin (MULTIVITAMIN) tablet Take 1 tablet by mouth daily.    [provider]  nitrofurantoin, macrocrystal-monohydrate, (MACROBID) 100 MG capsule Take 1 capsule (100 mg total) by mouth 2 (two) times daily. 03/11/19   Henson, Vickie L, NP-C  ondansetron (ZOFRAN) 4 MG tablet Take 1 tablet (4 mg total) by mouth every 8 (eight) hours as needed for nausea or vomiting. 10/17/18   Kathrynn Ducking, MD  oxybutynin (DITROPAN) 5 MG tablet TK 1 T PO Q 8 H PRF BLADDER SPASM 01/09/18   [provider]  Peppermint Oil (IBGARD PO) Take by mouth.    [provider]  potassium chloride SA (K-DUR) 20 MEQ tablet TAKE 2 TABLETS BY MOUTH FOR 7 DAYS THEN 1 TABLET DAILY 03/09/19   Henson, Vickie L, NP-C  predniSONE (DELTASONE) 20 MG tablet Take 1 tablet (20 mg total) by mouth 2 (two) times daily with a meal for 5 days. 08/23/19 08/28/19   Tal Neer C, [name]  Probiotic Product (RESTORA PO) Take by mouth.    [provider]  valACYclovir (VALTREX) 1000 MG tablet Take 1 tablet (1,000 mg total) by mouth 2 (two) times daily as needed. X 5 days during an outbreak 08/15/18   Harland Dingwall L, NP-C  venlafaxine XR (EFFEXOR-XR) 150 MG 24 hr capsule Take total of 225 mg daily (150 mg + 75 mg daily) 08/25/19   Norman Clay, MD  venlafaxine XR (EFFEXOR-XR) 75 MG 24 hr capsule 225 mg daily (150 mg + 75 mg daily) 08/25/19   Norman Clay, MD    Family History Family History  Problem Relation Age of Onset   Hypertension Mother    COPD Mother    Depression Mother    Hypertension Father    Kidney disease Father  Alcohol abuse Father    Hypertension Brother    Alzheimer's disease Paternal Grandmother     Social History Social History   Tobacco Use   Smoking status: Never Smoker   Smokeless tobacco: Never Used  Substance Use Topics   Alcohol use: No   Drug use: No     Allergies   Shellfish allergy, Bactrim [sulfamethoxazole-trimethoprim], and Chocolate flavor   Review of Systems Review of Systems  Constitutional: Negative for activity change, appetite change, chills, fatigue and fever.  HENT: Positive for congestion. Negative for ear pain, rhinorrhea, sinus pressure, sore throat and trouble swallowing.   Eyes: Negative for discharge and redness.  Respiratory: Positive for cough and shortness of breath. Negative for chest tightness.   Cardiovascular: Negative for chest pain and leg swelling.  Gastrointestinal: Negative for abdominal pain, diarrhea, nausea and vomiting.  Musculoskeletal: Negative for myalgias.  Skin: Negative for rash.  Neurological: Negative for dizziness, light-headedness and headaches.     Physical Exam Triage Vital Signs ED Triage Vitals  Enc Vitals Group     BP 08/23/19 1140 (!) 144/96     Pulse Rate 08/23/19 1140 95     Resp --      Temp 08/23/19 1140 98.4 F  (36.9 C)     Temp Source 08/23/19 1140 Skin     SpO2 08/23/19 1140 99 %     Weight --      Height --      Head Circumference --      Peak Flow --      Pain Score 08/23/19 1143 0     Pain Loc --      Pain Edu? --      Excl. in [place]? --    No data found.  Updated Vital Signs BP (!) 144/96 (BP Location: Left Arm)    Pulse 95    Temp 98.4 F (36.9 C) (Skin)    LMP 06/11/2013    SpO2 99%   Visual Acuity Right Eye Distance:   Left Eye Distance:   Bilateral Distance:    Right Eye Near:   Left Eye Near:    Bilateral Near:     Physical Exam Vitals signs and nursing note reviewed.  Constitutional:      General: She is not in acute distress.    Appearance: She is well-developed.  HENT:     Head: Normocephalic and atraumatic.     Ears:     Comments: Bilateral ears without tenderness to palpation of external auricle, tragus and mastoid, EAC's without erythema or swelling, TM's with good bony landmarks and cone of light. Non erythematous.     Nose:     Comments: Nasal mucosa erythematous, turbinates swollen and left nares    Mouth/Throat:     Comments: Oral mucosa pink and moist, no tonsillar enlargement or exudate. Posterior pharynx patent and nonerythematous, no uvula deviation or swelling. Normal phonation.  Eyes:     Conjunctiva/sclera: Conjunctivae normal.  Neck:     Musculoskeletal: Neck supple.  Cardiovascular:     Rate and Rhythm: Normal rate and regular rhythm.     Heart sounds: No murmur.  Pulmonary:     Effort: No respiratory distress.     Breath sounds: Normal breath sounds.     Comments: Patient does appear short of breath with talking, pauses frequently to catch her breath  Coughing with deep inspiration, faint wheezing with cough, wheezing otherwise not noted, no other adventitious sounds auscultated Abdominal:  Palpations: Abdomen is soft.     Tenderness: There is no abdominal tenderness.  Musculoskeletal:     Comments: Bilateral lower leg symmetric, no  calf tenderness or swelling bilaterally  Skin:    General: Skin is warm and dry.  Neurological:     Mental Status: She is alert.      UC Treatments / Results  Labs (all labs ordered are listed, but only abnormal results are displayed) Labs Reviewed  NOVEL CORONAVIRUS, NAA (HOSP ORDER, SEND-OUT TO REF LAB; TAT 18-24 HRS)    EKG   Radiology Dg Chest 2 View  Result Date: 08/23/2019 CLINICAL DATA:  Productive cough.  Shortness of breath. EXAM: CHEST - 2 VIEW COMPARISON:  None. FINDINGS: The heart size and mediastinal contours are within normal limits. Both lungs are clear. The visualized skeletal structures are unremarkable. IMPRESSION: No active cardiopulmonary disease. Electronically Signed   By: Dorise Bullion III M.D   On: 08/23/2019 12:42    Procedures Procedures (including critical care time)  Medications Ordered in UC Medications - No data to display  Initial Impression / Assessment and Plan / UC Course  I have reviewed the triage vital signs and the nursing notes.  Pertinent labs & imaging results that were available during my care of the patient were reviewed by me and considered in my medical decision making (see chart for details).     PERC negative.  EKG normal sinus rhythm, no acute signs of ischemia or infarction, no signs of pulmonary strain, chest x-ray negative.  We will go ahead and treat for bronchitis today.  Providing azithromycin, prednisone, albuterol and Tessalon.  COVID swab pending.  Advised patient to continue to monitor her shortness of breath and monitor for improvement with the medicines provided, follow-up if not resolving.  Developing worsening shortness of breath, chest discomfort, dizziness or lightheadedness to follow-up in emergency room to rule out PE.  Negative risk factors, also cough is productive PE seems less likely at this time.  Discussed strict return precautions. Patient verbalized understanding and is agreeable with plan.  Final  Clinical Impressions(s) / UC Diagnoses   Final diagnoses:  Cough  SOB (shortness of breath)     Discharge Instructions     Please use albuterol inhaler 1 to 2 puffs every 4-6 hours as needed for shortness of breath Please also begin prednisone 20 mg twice daily for the next 5 days, please take with food Begin azithromycin-2 tablets today, 1 tablet for the following 4 days Tessalon every 8 hours as needed for cough Please continue to rest and drink plenty of fluids COVID swab pending  Please follow-up if developing worsening shortness of breath, chest pain, dizziness, lightheadedness, fevers, leg pain or leg swelling    ED Prescriptions    Medication Sig Dispense Auth. Provider   albuterol (VENTOLIN HFA) 108 (90 Base) MCG/ACT inhaler Inhale 1-2 puffs into the lungs every 6 (six) hours as needed for wheezing or shortness of breath. 8 g Denajah Farias C, [name]   predniSONE (DELTASONE) 20 MG tablet Take 1 tablet (20 mg total) by mouth 2 (two) times daily with a meal for 5 days. 10 tablet Janiaya Ryser C, [name]   azithromycin (ZITHROMAX) 250 MG tablet Take 1 tablet (250 mg total) by mouth daily. Take first 2 tablets together, then 1 every day until finished. 6 tablet Dawon Troop C, [name]   benzonatate (TESSALON) 200 MG capsule Take 1 capsule (200 mg total) by mouth 3 (three) times daily as needed for  up to 7 days for cough. 28 capsule Braydn Carneiro, [place] C, [name]     PDMP not reviewed this encounter.   Talley Kreiser, [place] C, [name] 08/23/19 1420

## 2019-08-24 LAB — NOVEL CORONAVIRUS, NAA (HOSP ORDER, SEND-OUT TO REF LAB; TAT 18-24 HRS): SARS-CoV-2, NAA: NOT DETECTED

## 2019-08-26 ENCOUNTER — Other Ambulatory Visit: Payer: Self-pay

## 2019-08-26 ENCOUNTER — Emergency Department (HOSPITAL_COMMUNITY)
Admission: EM | Admit: 2019-08-26 | Discharge: 2019-08-26 | Disposition: A | Discharge status: Home / Self Care | Payer: BC Managed Care – PPO | Attending: Emergency Medicine | Admitting: Emergency Medicine

## 2019-08-26 ENCOUNTER — Encounter (HOSPITAL_COMMUNITY): Payer: Self-pay

## 2019-08-26 ENCOUNTER — Emergency Department (HOSPITAL_COMMUNITY): Payer: BC Managed Care – PPO

## 2019-08-26 DIAGNOSIS — J209 Acute bronchitis, unspecified: Secondary | ICD-10-CM

## 2019-08-26 DIAGNOSIS — I10 Essential (primary) hypertension: Secondary | ICD-10-CM | POA: Diagnosis not present

## 2019-08-26 DIAGNOSIS — Z79899 Other long term (current) drug therapy: Secondary | ICD-10-CM | POA: Insufficient documentation

## 2019-08-26 DIAGNOSIS — Z853 Personal history of malignant neoplasm of breast: Secondary | ICD-10-CM | POA: Insufficient documentation

## 2019-08-26 DIAGNOSIS — R0602 Shortness of breath: Secondary | ICD-10-CM | POA: Diagnosis not present

## 2019-08-26 DIAGNOSIS — R05 Cough: Secondary | ICD-10-CM | POA: Diagnosis present

## 2019-08-26 NOTE — ED Provider Notes (Signed)
[facility] DEPT Provider Note   CSN: [id number] Arrival date & time: 08/26/19  [id number]     History   Chief Complaint Chief Complaint  Patient presents with  . Shortness of Breath  . Cough    HPI Joanne Garrett is a 43 y.o. female.     HPI  43 year old female presents with cough and shortness of breath.  Has been ongoing for about 5 days.  Went to urgent care about 3 days ago and was prescribed azithromycin, Tessalon, prednisone, and albuterol.  Use the albuterol once or twice a day.  It does seem to help but also makes her jittery.  The cough has yellow sputum.  There is no fever or chest pain.  She feels like her symptoms essentially have not improved.  She went to work and was having a hard time talking on the telephone due to shortness of breath so her work sent her to here.  No leg swelling or history of CHF. They tested her for COVID and was negative.  The azithromycin has made her stomach upset but she is able to tolerate it and has 1 more day left.  Past Medical History:  Diagnosis Date  . Anxiety   . 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 Depression   . Genital herpes   . Hematuria   . History of hyperthyroidism 02/2001   s/p  RAI 03-2001  . History of panic attacks   . History of radiation therapy    completed 09-23-2012  50Gy in 25 fractions and boost completed [phone]  . History of thyroid nodule   . Hypertension   . Renal calculus, right   . Rheumatoid arthritis ([place])   . Trigeminal neuralgia of right side of face 04/15/2019   V2  . Urgency of urination     Patient Active Problem List   Diagnosis Date Noted  . Snoring 05/20/2019  . Insomnia 05/20/2019  . Trigeminal neuralgia of right side of face 04/15/2019  . MDD (major depressive disorder), recurrent, in partial  remission ([place]) 01/22/2019  . Obesity (BMI 30.0-34.9) 01/31/2018  . 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  . Rheumatoid arthritis ([place])   . PTSD (post-traumatic stress disorder) 09/17/2017  . MDD (major depressive disorder), recurrent episode, moderate ([place]) 09/17/2017  . HTN (hypertension) 01/16/2017  . Genital herpes 01/16/2017  . History of thyroid disorder 01/16/2017  . Left thyroid nodule 01/16/2017  . Breast pain, left 01/16/2017  . History of breast cancer 01/16/2017    Past Surgical History:  Procedure Laterality Date  . ANTERIOR CERVICAL DECOMP/DISCECTOMY FUSION  05-18-2015   [facility]  . BREAST LUMPECTOMY Left 07-03-2012    [facility]  . CYSTOSCOPY WITH RETROGRADE PYELOGRAM, URETEROSCOPY AND STENT PLACEMENT Right 09/09/2017   Procedure: CYSTOSCOPY WITH RIGHT RETROGRADE PYELOGRAM, URETEROSCOPY , STONE BASKETRY AND STENT PLACEMENT;  Surgeon: Lucas Mallow, MD;  Location: [facility];  Service: Urology;  Laterality: Right;  . KNEE ARTHROSCOPY Left 2009  . TOTAL ABDOMINAL HYSTERECTOMY W/ BILATERAL SALPINGOOPHORECTOMY  2014     OB History   No obstetric history on file.      Home Medications    Prior to Admission medications   Medication Sig Start Date End Date Taking? Authorizing Provider  acetaminophen (TYLENOL) 500 MG tablet Take 500 mg by mouth every 6 (six)  hours as needed.    [provider]  albuterol (VENTOLIN HFA) 108 (90 Base) MCG/ACT inhaler Inhale 1-2 puffs into the lungs every 6 (six) hours as needed for wheezing or shortness of breath. 08/23/19   Wieters, Hallie C, [name]  amLODipine (NORVASC) 10 MG tablet TAKE 1 TABLET BY MOUTH DAILY 01/02/19   Henson, Vickie L, NP-C  azithromycin (ZITHROMAX) 250 MG tablet Take 1 tablet (250 mg total) by mouth daily. Take first 2 tablets together, then 1 every day until finished. 08/23/19   Wieters, Hallie C, [name]  benzonatate (TESSALON) 200 MG capsule Take 1 capsule (200 mg total) by mouth 3 (three)  times daily as needed for up to 7 days for cough. 08/23/19 08/30/19  Wieters, Hallie C, [name]  carbamazepine (TEGRETOL) 200 MG tablet Take 1 tablet (200 mg total) by mouth 2 (two) times daily. 07/30/19   Kathrynn Ducking, MD  folic acid (FOLVITE) 1 MG tablet Take 1 mg by mouth daily. 10/20/18   [provider]  gabapentin (NEURONTIN) 600 MG tablet Take 1 tablet (600 mg total) by mouth 2 (two) times daily. 04/15/19   Kathrynn Ducking, MD  golimumab (Witt ARIA) 50 MG/4ML SOLN injection Inject into the vein. 03/03/19   [provider]  LORazepam (ATIVAN) 0.5 MG tablet 0.25-0.5 mg daily as needed for anxiety 01/22/19   Norman Clay, MD  meclizine (ANTIVERT) 25 MG tablet Take 1 tablet (25 mg total) by mouth 2 (two) times daily as needed for dizziness. 08/25/18   Henson, Vickie L, NP-C  methotrexate (50 MG/ML) 1 G injection Inject 40 mg into the vein once a week.     [provider]  Multiple Vitamin (MULTIVITAMIN) tablet Take 1 tablet by mouth daily.    [provider]  nitrofurantoin, macrocrystal-monohydrate, (MACROBID) 100 MG capsule Take 1 capsule (100 mg total) by mouth 2 (two) times daily. 03/11/19   Henson, Vickie L, NP-C  ondansetron (ZOFRAN) 4 MG tablet Take 1 tablet (4 mg total) by mouth every 8 (eight) hours as needed for nausea or vomiting. 10/17/18   Kathrynn Ducking, MD  oxybutynin (DITROPAN) 5 MG tablet TK 1 T PO Q 8 H PRF BLADDER SPASM 01/09/18   [provider]  Peppermint Oil (IBGARD PO) Take by mouth.    [provider]  potassium chloride SA (K-DUR) 20 MEQ tablet TAKE 2 TABLETS BY MOUTH FOR 7 DAYS THEN 1 TABLET DAILY 03/09/19   Henson, Vickie L, NP-C  predniSONE (DELTASONE) 20 MG tablet Take 1 tablet (20 mg total) by mouth 2 (two) times daily with a meal for 5 days. 08/23/19 08/28/19  Wieters, Hallie C, [name]  Probiotic Product (RESTORA PO) Take by mouth.    [provider]  valACYclovir (VALTREX) 1000 MG tablet Take 1 tablet  (1,000 mg total) by mouth 2 (two) times daily as needed. X 5 days during an outbreak 08/15/18   Harland Dingwall L, NP-C  venlafaxine XR (EFFEXOR-XR) 150 MG 24 hr capsule Take total of 225 mg daily (150 mg + 75 mg daily) 08/25/19   Norman Clay, MD  venlafaxine XR (EFFEXOR-XR) 75 MG 24 hr capsule 225 mg daily (150 mg + 75 mg daily) 08/25/19   Norman Clay, MD    Family History Family History  Problem Relation Age of Onset  . Hypertension Mother   . COPD Mother   . Depression Mother   . Hypertension Father   . Kidney disease Father   . Alcohol abuse Father   .  Hypertension Brother   . Alzheimer's disease Paternal Grandmother     Social History Social History   Tobacco Use  . Smoking status: Never Smoker  . Smokeless tobacco: Never Used  Substance Use Topics  . Alcohol use: No  . Drug use: No     Allergies   Shellfish allergy, Bactrim [sulfamethoxazole-trimethoprim], and Chocolate flavor   Review of Systems Review of Systems  Constitutional: Negative for fever.  HENT: Positive for postnasal drip. Negative for sore throat.   Respiratory: Positive for cough, shortness of breath and wheezing.   Cardiovascular: Negative for chest pain and leg swelling.  All other systems reviewed and are negative.    Physical Exam Updated Vital Signs BP (!) 162/108 (BP Location: Left Arm)   Pulse 87   Temp 98.6 F (37 C) (Oral)   Resp (!) 27   Ht 5\' 3"  (1.6 m)   Wt 92.1 kg   LMP 06/11/2013   SpO2 99%   BMI 35.96 kg/m   Physical Exam Vitals signs and nursing note reviewed.  Constitutional:      General: She is not in acute distress.    Appearance: She is well-developed. She is not ill-appearing or diaphoretic.  HENT:     Head: Normocephalic and atraumatic.     Right Ear: External ear normal.     Left Ear: External ear normal.     Nose: Nose normal.  Eyes:     General:        Right eye: No discharge.        Left eye: No discharge.  Cardiovascular:     Rate and Rhythm:  Normal rate and regular rhythm.     Heart sounds: Normal heart sounds.  Pulmonary:     Effort: Pulmonary effort is normal. Tachypnea present. No accessory muscle usage.     Breath sounds: Normal breath sounds. No decreased breath sounds, wheezing, rhonchi or rales.  Abdominal:     Palpations: Abdomen is soft.     Tenderness: There is no abdominal tenderness.  Musculoskeletal:     Right lower leg: No edema.     Left lower leg: No edema.  Skin:    General: Skin is warm and dry.  Neurological:     Mental Status: She is alert.  Psychiatric:        Mood and Affect: Mood is not anxious.      ED Treatments / Results  Labs (all labs ordered are listed, but only abnormal results are displayed) Labs Reviewed - No data to display  EKG EKG Interpretation  Date/Time:  Wednesday August 26 2019 10:06:52 EDT Ventricular Rate:  85 PR Interval:    QRS Duration: 94 QT Interval:  373 QTC Calculation: 444 R Axis:   53 Text Interpretation:  Sinus rhythm Baseline wander in lead(s) V2 No old tracing to compare Confirmed by Dorie Rank [phone]) on 08/26/2019 10:10:05 AM   Radiology Dg Chest 2 View  Result Date: 08/26/2019 CLINICAL DATA:  Shortness of breath EXAM: CHEST - 2 VIEW COMPARISON:  08/23/2019 FINDINGS: Heart and mediastinal contours are within normal limits. No focal opacities or effusions. No acute bony abnormality. IMPRESSION: No active cardiopulmonary disease. Electronically Signed   By: Rolm Baptise M.D.   On: 08/26/2019 11:06    Procedures Procedures (including critical care time)  Medications Ordered in ED Medications - No data to display   Initial Impression / Assessment and Plan / ED Course  I have reviewed the triage vital signs and the  nursing notes.  Pertinent labs & imaging results that were available during my care of the patient were reviewed by me and considered in my medical decision making (see chart for details).        Presents with what is likely  bronchitis.  She has a little bit of increased work of breathing but she feels well enough to go home.  I do not hear any wheezing currently though I have encouraged her she can use the albuterol every 4 hours as needed for cough or shortness of breath.  Given her hypertension I offered to check some labs but at this point my suspicion of CHF or anemia causing her shortness of breath is pretty low.  There is no peripheral edema.  I doubt ACS.  PE seems pretty unlikely.  She has had negative x-rays x2 which suggests this is unlikely to be pneumonia.  This is probably viral and so I discussed supportive care.  Otherwise, she was already tested for the novel coronavirus and was negative so I do not think repeat testing is needed and she agreed.  Discharged home with return precautions.  Joanne Garrett was evaluated in Emergency Department on 08/26/2019 for the symptoms described in the history of present illness. She was evaluated in the context of the global COVID-19 pandemic, which necessitated consideration that the patient might be at risk for infection with the SARS-CoV-2 virus that causes COVID-19. Institutional protocols and algorithms that pertain to the evaluation of patients at risk for COVID-19 are in a state of rapid change based on information released by regulatory bodies including the CDC and federal and state organizations. These policies and algorithms were followed during the patient's care in the ED.   Final Clinical Impressions(s) / ED Diagnoses   Final diagnoses:  Acute bronchitis, unspecified organism    ED Discharge Orders    None       Sherwood Gambler, MD 08/26/19 1228

## 2019-08-26 NOTE — ED Triage Notes (Signed)
Patient reports that she has had a productive cough with yellow sputum and SOB x 1 week. Patient went to an UC 3 days ago and was prescribed a Z-pack, albuterol inhaler, Tessalon Perles, and Prednisone. Patient states she did not finish the Z-Pack because she was vomiting when she took it. Patient states she is not any better and was told to follow up. Patient had a negative Covid-19 test at the UC 3 days ago.

## 2019-08-26 NOTE — Discharge Instructions (Signed)
Use the albuterol inhaler 1-2 puffs every 4 hours as needed for cough or shortness of breath.  If you develop high fever, worsening shortness of breath, chest pain, vomiting, blood in your sputum, or any other new/concerning symptoms then return to the ER for evaluation.

## 2019-08-27 ENCOUNTER — Ambulatory Visit: Payer: BC Managed Care – PPO

## 2019-08-31 ENCOUNTER — Emergency Department (HOSPITAL_COMMUNITY): Payer: BC Managed Care – PPO

## 2019-08-31 ENCOUNTER — Emergency Department (HOSPITAL_COMMUNITY)
Admission: EM | Admit: 2019-08-31 | Discharge: 2019-09-01 | Disposition: A | Discharge status: Home / Self Care | Payer: BC Managed Care – PPO | Attending: Emergency Medicine | Admitting: Emergency Medicine

## 2019-08-31 ENCOUNTER — Other Ambulatory Visit: Payer: Self-pay

## 2019-08-31 ENCOUNTER — Encounter (HOSPITAL_COMMUNITY): Payer: Self-pay | Admitting: Emergency Medicine

## 2019-08-31 DIAGNOSIS — R319 Hematuria, unspecified: Secondary | ICD-10-CM | POA: Diagnosis not present

## 2019-08-31 DIAGNOSIS — Z5321 Procedure and treatment not carried out due to patient leaving prior to being seen by health care provider: Secondary | ICD-10-CM | POA: Diagnosis not present

## 2019-08-31 DIAGNOSIS — N132 Hydronephrosis with renal and ureteral calculous obstruction: Secondary | ICD-10-CM | POA: Diagnosis not present

## 2019-08-31 DIAGNOSIS — N200 Calculus of kidney: Secondary | ICD-10-CM | POA: Diagnosis not present

## 2019-08-31 DIAGNOSIS — R1030 Lower abdominal pain, unspecified: Secondary | ICD-10-CM | POA: Diagnosis present

## 2019-08-31 LAB — URINALYSIS, MICROSCOPIC (REFLEX)

## 2019-08-31 LAB — CBC
HCT: 39.8 % (ref 36.0–46.0)
Hemoglobin: 12.8 g/dL (ref 12.0–15.0)
MCH: 27.2 pg (ref 26.0–34.0)
MCHC: 32.2 g/dL (ref 30.0–36.0)
MCV: 84.7 fL (ref 80.0–100.0)
Platelets: 245 10*3/uL (ref 150–400)
RBC: 4.7 MIL/uL (ref 3.87–5.11)
RDW: 13.1 % (ref 11.5–15.5)
WBC: 10.9 10*3/uL — ABNORMAL HIGH (ref 4.0–10.5)
nRBC: 0 % (ref 0.0–0.2)

## 2019-08-31 LAB — COMPREHENSIVE METABOLIC PANEL
ALT: 25 U/L (ref 0–44)
AST: 20 U/L (ref 15–41)
Albumin: 4.1 g/dL (ref 3.5–5.0)
Alkaline Phosphatase: 66 U/L (ref 38–126)
Anion gap: 12 (ref 5–15)
BUN: 8 mg/dL (ref 6–20)
CO2: 25 mmol/L (ref 22–32)
Calcium: 9.4 mg/dL (ref 8.9–10.3)
Chloride: 103 mmol/L (ref 98–111)
Creatinine, Ser: 0.59 mg/dL (ref 0.44–1.00)
GFR calc Af Amer: >60 mL/min (ref >60)
GFR calc non Af Amer: >60 mL/min (ref >60)
Glucose, Bld: 95 mg/dL (ref 70–99)
Potassium: 3.1 mmol/L — ABNORMAL LOW (ref 3.5–5.1)
Sodium: 140 mmol/L (ref 135–145)
Total Bilirubin: 0.1 mg/dL — ABNORMAL LOW (ref 0.3–1.2)
Total Protein: 7.4 g/dL (ref 6.5–8.1)

## 2019-08-31 LAB — URINALYSIS, ROUTINE W REFLEX MICROSCOPIC
Bilirubin Urine: NEGATIVE
Glucose, UA: NEGATIVE mg/dL
Ketones, ur: NEGATIVE mg/dL
Nitrite: NEGATIVE
Protein, ur: 30 mg/dL — AB
Specific Gravity, Urine: 1.015 (ref 1.005–1.030)
pH: 7 (ref 5.0–8.0)

## 2019-08-31 LAB — LIPASE, BLOOD: Lipase: 27 U/L (ref 11–51)

## 2019-08-31 MED ORDER — SODIUM CHLORIDE 0.9% FLUSH: 3.0000 mL | Freq: Once | INTRAVENOUS | Status: DC

## 2019-08-31 NOTE — ED Triage Notes (Signed)
Patient reports low abdominal pain with hematuria onset this morning , denies emesis or diarrhea , no fever or chills , reports history of kidney stones .

## 2019-09-01 ENCOUNTER — Ambulatory Visit (INDEPENDENT_AMBULATORY_CARE_PROVIDER_SITE_OTHER)
Admission: EM | Admit: 2019-09-01 | Discharge: 2019-09-01 | Disposition: A | Discharge status: Home / Self Care | Payer: BC Managed Care – PPO | Source: Home / Self Care | Attending: Emergency Medicine | Admitting: Emergency Medicine

## 2019-09-01 ENCOUNTER — Encounter (HOSPITAL_COMMUNITY): Payer: Self-pay

## 2019-09-01 DIAGNOSIS — N200 Calculus of kidney: Secondary | ICD-10-CM | POA: Diagnosis not present

## 2019-09-01 DIAGNOSIS — N390 Urinary tract infection, site not specified: Secondary | ICD-10-CM | POA: Diagnosis not present

## 2019-09-01 MED ORDER — KETOROLAC TROMETHAMINE 30 MG/ML IJ SOLN
30.0000 mg | Freq: Once | INTRAMUSCULAR | Status: AC
  Administered 2019-09-01: 30 mg via INTRAMUSCULAR

## 2019-09-01 MED ORDER — CEFTRIAXONE SODIUM 1 G IJ SOLR
INTRAMUSCULAR | Status: AC
  Filled 2019-09-01: qty 10

## 2019-09-01 MED ORDER — IBUPROFEN 600 MG PO TABS: 600.0000 mg | ORAL_TABLET | Freq: Four times a day (QID) | ORAL | 0 refills | Status: DC | PRN

## 2019-09-01 MED ORDER — KETOROLAC TROMETHAMINE 30 MG/ML IJ SOLN
INTRAMUSCULAR | Status: AC
  Filled 2019-09-01: qty 1

## 2019-09-01 MED ORDER — TAMSULOSIN HCL 0.4 MG PO CAPS: 0.4000 mg | ORAL_CAPSULE | Freq: Every day | ORAL | 0 refills | Status: AC

## 2019-09-01 MED ORDER — CEFTRIAXONE SODIUM 1 G IJ SOLR
1.0000 g | Freq: Once | INTRAMUSCULAR | Status: AC
  Administered 2019-09-01: 1 g via INTRAMUSCULAR

## 2019-09-01 MED ORDER — LIDOCAINE HCL (PF) 1 % IJ SOLN
INTRAMUSCULAR | Status: AC
  Filled 2019-09-01: qty 2

## 2019-09-01 MED ORDER — CEPHALEXIN 500 MG PO CAPS: 500.0000 mg | ORAL_CAPSULE | Freq: Four times a day (QID) | ORAL | 0 refills | Status: AC

## 2019-09-01 MED ORDER — OXYBUTYNIN CHLORIDE 5 MG PO TABS: 5.0000 mg | ORAL_TABLET | Freq: Three times a day (TID) | ORAL | 0 refills | Status: AC | PRN

## 2019-09-01 NOTE — ED Triage Notes (Signed)
Pt states she has pelvis pressure and blood in her urine. Lower back pain. Pt state she has been in the ER 13 hrs today.

## 2019-09-01 NOTE — Discharge Instructions (Addendum)
Call Dr. Gloriann Loan or Johnney Ou today and see if they agree with our current plan and whether anything else needs to be done.  Take 600 mg ibuprofen combined with 1 g of Tylenol 3-4 times a day as needed for pain, push fluids, start the Flomax.  Finish the Keflex, even if you feel better.  We will call you if your urine culture comes back positive for UTI that requires a different antibiotic.

## 2019-09-01 NOTE — ED Provider Notes (Signed)
HPI  SUBJECTIVE:  Joanne Garrett is a 43 y.o. female who presents with dull, achy, intermittent minutes long left-sided low back pain that radiates to the front.  She reports low abdominal pain.  She reports constant pelvic pressure, gross hematuria, urinary urgency.  This started yesterday.  She states that the back pain seems to be getting better.  No nausea, vomiting, fevers, dysuria, frequency, cloudy or odorous urine.  No aggravating or alleviating factors.  She has not tried anything for this. Patient was in the ED last night, got labs and a CT.  She left before being seen due to the wait and came here.  No leukocytosis, normal CMP.  Urine had many bacteria, trace leukocytes, large blood.  It was not a contaminated specimen.  CT showed left-sided obstructive uropathy secondary to cluster stones measuring up to 4 mm.  Mild left-sided hydronephrosis.  Bilateral nephrolithiasis.  She has a past medical history of rheumatoid arthritis, hypertension, UTI, pyelonephritis, nephrolithiasis status post stent and lithotripsy in 09/2017.  She had a Proteus UTI in 2017, it was resistant to Macrobid, otherwise pansensitive.  All of her other urine cultures have grown back mixed urogenital flora.  LMP: Status post hysterectomy.  PMD: Dr. Raenette Rover.  Urology: Dr. Gloriann Loan.  Nephrology: Dr. Johnney Ou.  Past Medical History:  Diagnosis Date  . Anxiety   . 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 Depression   . Genital herpes   . Hematuria   . History of hyperthyroidism 02/2001   s/p  RAI 03-2001  . History of panic attacks   . History of radiation therapy    completed 09-23-2012  50Gy in 25 fractions and boost completed [phone]  . History of thyroid nodule   . Hypertension   . Renal calculus, right   . Rheumatoid arthritis ([place])   . Trigeminal neuralgia  of right side of face 04/15/2019   V2  . Urgency of urination     Past Surgical History:  Procedure Laterality Date  . ABDOMINAL HYSTERECTOMY    . ANTERIOR CERVICAL DECOMP/DISCECTOMY FUSION  05-18-2015   [facility]  . BREAST LUMPECTOMY Left 07-03-2012    [facility]  . CYSTOSCOPY WITH RETROGRADE PYELOGRAM, URETEROSCOPY AND STENT PLACEMENT Right 09/09/2017   Procedure: CYSTOSCOPY WITH RIGHT RETROGRADE PYELOGRAM, URETEROSCOPY , STONE BASKETRY AND STENT PLACEMENT;  Surgeon: Lucas Mallow, MD;  Location: [facility];  Service: Urology;  Laterality: Right;  . KNEE ARTHROSCOPY Left 2009  . TOTAL ABDOMINAL HYSTERECTOMY W/ BILATERAL SALPINGOOPHORECTOMY  2014    Family History  Problem Relation Age of Onset  . Hypertension Mother   . COPD Mother   . Depression Mother   . Hypertension Father   . Kidney disease Father   . Alcohol abuse Father   . Hypertension Brother   . Alzheimer's disease Paternal Grandmother     Social History   Tobacco Use  . Smoking status: Never Smoker  . Smokeless tobacco: Never Used  Substance Use Topics  . Alcohol use: No  . Drug use: No    No current facility-administered medications for this encounter.   Current Outpatient Medications:  .  albuterol (VENTOLIN HFA) 108 (90 Base) MCG/ACT inhaler, Inhale 1-2 puffs into the lungs every 6 (six) hours as needed for wheezing or shortness of breath., Disp: 8 g, Rfl: 0 .  amLODipine (  NORVASC) 10 MG tablet, TAKE 1 TABLET BY MOUTH DAILY, Disp: 30 tablet, Rfl: 5 .  carbamazepine (TEGRETOL) 200 MG tablet, Take 1 tablet (200 mg total) by mouth 2 (two) times daily., Disp: 60 tablet, Rfl: 3 .  cephALEXin (KEFLEX) 500 MG capsule, Take 1 capsule (500 mg total) by mouth 4 (four) times daily for 7 days., Disp: 28 capsule, Rfl: 0 .  folic acid (FOLVITE) 1 MG tablet, Take 1 mg by mouth daily., Disp: , Rfl:  .  gabapentin (NEURONTIN) 600 MG tablet, Take 1 tablet (600 mg total) by mouth 2 (two) times daily., Disp: 180  tablet, Rfl: 3 .  golimumab (SIMPONI ARIA) 50 MG/4ML SOLN injection, Inject into the vein., Disp: , Rfl:  .  ibuprofen (ADVIL) 600 MG tablet, Take 1 tablet (600 mg total) by mouth every 6 (six) hours as needed., Disp: 30 tablet, Rfl: 0 .  LORazepam (ATIVAN) 0.5 MG tablet, 0.25-0.5 mg daily as needed for anxiety, Disp: 30 tablet, Rfl: 2 .  meclizine (ANTIVERT) 25 MG tablet, Take 1 tablet (25 mg total) by mouth 2 (two) times daily as needed for dizziness., Disp: 30 tablet, Rfl: 0 .  methotrexate (50 MG/ML) 1 G injection, Inject 40 mg into the vein once a week. , Disp: , Rfl:  .  Multiple Vitamin (MULTIVITAMIN) tablet, Take 1 tablet by mouth daily., Disp: , Rfl:  .  ondansetron (ZOFRAN) 4 MG tablet, Take 1 tablet (4 mg total) by mouth every 8 (eight) hours as needed for nausea or vomiting., Disp: 30 tablet, Rfl: 2 .  oxybutynin (DITROPAN) 5 MG tablet, Take 1 tablet (5 mg total) by mouth every 8 (eight) hours as needed for up to 10 days for bladder spasms., Disp: 30 tablet, Rfl: 0 .  Peppermint Oil (IBGARD PO), Take by mouth., Disp: , Rfl:  .  potassium chloride SA (K-DUR) 20 MEQ tablet, TAKE 2 TABLETS BY MOUTH FOR 7 DAYS THEN 1 TABLET DAILY, Disp: 30 tablet, Rfl: 0 .  Probiotic Product (RESTORA PO), Take by mouth., Disp: , Rfl:  .  tamsulosin (FLOMAX) 0.4 MG CAPS capsule, Take 1 capsule (0.4 mg total) by mouth at bedtime for 7 days., Disp: 7 capsule, Rfl: 0 .  valACYclovir (VALTREX) 1000 MG tablet, Take 1 tablet (1,000 mg total) by mouth 2 (two) times daily as needed. X 5 days during an outbreak, Disp: 30 tablet, Rfl: 2 .  venlafaxine XR (EFFEXOR-XR) 150 MG 24 hr capsule, Take total of 225 mg daily (150 mg + 75 mg daily), Disp: 90 capsule, Rfl: 1 .  venlafaxine XR (EFFEXOR-XR) 75 MG 24 hr capsule, 225 mg daily (150 mg + 75 mg daily), Disp: 90 capsule, Rfl: 1  Allergies  Allergen Reactions  . Shellfish Allergy Itching and Other (See Comments)    HIVES IF GOES OVERBOARD, HAS HAD IV DYE AND HAD NO  PROBLEMS  . Bactrim [Sulfamethoxazole-Trimethoprim] Hives and Other (See Comments)  . Chocolate Flavor Itching and Other (See Comments)    PLAIN CHOCOLATE IN ABUNDANCE     ROS  As noted in HPI.   Physical Exam  BP (!) 146/95 (BP Location: Right Arm)   Pulse 79   Temp 98.4 F (36.9 C) (Oral)   Resp 18   Wt 92.5 kg   LMP 06/11/2013   SpO2 99%   BMI 36.14 kg/m   Constitutional: Well developed, well nourished, no acute distress Eyes:  EOMI, conjunctiva normal bilaterally HENT: Normocephalic, atraumatic,mucus membranes moist Respiratory: Normal inspiratory effort Cardiovascular: Normal  rate GI: nondistended, soft.  Positive suprapubic, left flank tenderness.  No guarding, rebound.  Active bowel sounds. Back: Positive mild left CVAT. skin: No rash, skin intact Musculoskeletal: no deformities Neurologic: Alert & oriented x 3, no focal neuro deficits Psychiatric: Speech and behavior appropriate   ED Course   Medications  ketorolac (TORADOL) 30 MG/ML injection 30 mg (30 mg Intramuscular Given 09/01/19 1106)  cefTRIAXone (ROCEPHIN) injection 1 g (1 g Intramuscular Given 09/01/19 1106)  ketorolac (TORADOL) 30 MG/ML injection (has no administration in time range)  cefTRIAXone (ROCEPHIN) 1 g injection (has no administration in time range)  lidocaine (PF) (XYLOCAINE) 1 % injection (has no administration in time range)    Orders Placed This Encounter  Procedures  . Urine culture    Standing Status:   Standing    Number of Occurrences:   1    Results for orders placed or performed during the hospital encounter of 08/31/19 (from the past 24 hour(s))  Urinalysis, Routine w reflex microscopic     Status: Abnormal   Collection Time: 08/31/19  8:08 PM  Result Value Ref Range   Color, Urine AMBER (A) YELLOW   APPearance HAZY (A) CLEAR   Specific Gravity, Urine 1.015 1.005 - 1.030   pH 7.0 5.0 - 8.0   Glucose, UA NEGATIVE NEGATIVE mg/dL   Hgb urine dipstick LARGE (A) NEGATIVE    Bilirubin Urine NEGATIVE NEGATIVE   Ketones, ur NEGATIVE NEGATIVE mg/dL   Protein, ur 30 (A) NEGATIVE mg/dL   Nitrite NEGATIVE NEGATIVE   Leukocytes,Ua TRACE (A) NEGATIVE  Urinalysis, Microscopic (reflex)     Status: Abnormal   Collection Time: 08/31/19  8:08 PM  Result Value Ref Range   RBC / HPF 11-20 0 - 5 RBC/hpf   WBC, UA 0-5 0 - 5 WBC/hpf   Bacteria, UA MANY (A) NONE SEEN   Squamous Epithelial / LPF 0-5 0 - 5  Lipase, blood     Status: None   Collection Time: 08/31/19  8:44 PM  Result Value Ref Range   Lipase 27 11 - 51 U/L  Comprehensive metabolic panel     Status: Abnormal   Collection Time: 08/31/19  8:44 PM  Result Value Ref Range   Sodium 140 135 - 145 mmol/L   Potassium 3.1 (L) 3.5 - 5.1 mmol/L   Chloride 103 98 - 111 mmol/L   CO2 25 22 - 32 mmol/L   Glucose, Bld 95 70 - 99 mg/dL   BUN 8 6 - 20 mg/dL   Creatinine, Ser 0.59 0.44 - 1.00 mg/dL   Calcium 9.4 8.9 - 10.3 mg/dL   Total Protein 7.4 6.5 - 8.1 g/dL   Albumin 4.1 3.5 - 5.0 g/dL   AST 20 15 - 41 U/L   ALT 25 0 - 44 U/L   Alkaline Phosphatase 66 38 - 126 U/L   Total Bilirubin 0.1 (L) 0.3 - 1.2 mg/dL   GFR calc non Af Amer >60 >60 mL/min   GFR calc Af Amer >60 >60 mL/min   Anion gap 12 5 - 15  CBC     Status: Abnormal   Collection Time: 08/31/19  8:44 PM  Result Value Ref Range   WBC 10.9 (H) 4.0 - 10.5 K/uL   RBC 4.70 3.87 - 5.11 MIL/uL   Hemoglobin 12.8 12.0 - 15.0 g/dL   HCT 39.8 36.0 - 46.0 %   MCV 84.7 80.0 - 100.0 fL   MCH 27.2 26.0 - 34.0 pg   MCHC  32.2 30.0 - 36.0 g/dL   RDW 13.1 11.5 - 15.5 %   Platelets 245 150 - 400 K/uL   nRBC 0.0 0.0 - 0.2 %   Ct Renal Stone Study  Result Date: 08/31/2019 CLINICAL DATA:  Flank pain EXAM: CT ABDOMEN AND PELVIS WITHOUT CONTRAST TECHNIQUE: Multidetector CT imaging of the abdomen and pelvis was performed following the standard protocol without IV contrast. COMPARISON:  12/20/2016 FINDINGS: Lower chest: The lung bases are clear. The heart size is normal.  Hepatobiliary: There is decreased hepatic attenuation suggestive of hepatic steatosis. Normal gallbladder.There is no biliary ductal dilation. Pancreas: Normal contours without ductal dilatation. No peripancreatic fluid collection. Spleen: No splenic laceration or hematoma. Adrenals/Urinary Tract: --Adrenal glands: No adrenal hemorrhage. --Right kidney/ureter: There are multiple nonobstructing right-sided kidney stones. No right-sided hydronephrosis --Left kidney/ureter: . There is mild left-sided hydronephrosis secondary to a cluster of stones in the mid to distal left ureter measuring up to approximately 4 mm. Additional nonobstructing stones are noted throughout the left kidney. --Urinary bladder: Unremarkable. Stomach/Bowel: --Stomach/Duodenum: No hiatal hernia or other gastric abnormality. Normal duodenal course and caliber. --Small bowel: No dilatation or inflammation. --Colon: No focal abnormality. --Appendix: Normal. Vascular/Lymphatic: Normal course and caliber of the major abdominal vessels. --No retroperitoneal lymphadenopathy. --No mesenteric lymphadenopathy. --No pelvic or inguinal lymphadenopathy. Reproductive: Status post hysterectomy. No adnexal mass. Other: No ascites or free air. The abdominal wall is normal. Musculoskeletal. No acute displaced fractures. IMPRESSION: 1. Left-sided obstructive uropathy secondary to a cluster of stones in the mid to distal left ureter measuring up to approximately 4 mm. 2. Bilateral nephrolithiasis. 3. Hepatic steatosis. Electronically Signed   By: Constance Holster M.D.   On: 08/31/2019 22:59    ED Clinical Impression  1. Left nephrolithiasis   2. Complicated UTI (urinary tract infection)      ED Assessment/Plan  Radiology report and labs done in the ED reviewed.   Patient with left-sided nephrolithiasis.  Will try Flomax, NSAIDs.  30 mg of Toradol IM here.  Home with Flomax, ibuprofen 600 mg combined with 1 g of Tylenol 3-4 times a day.  Also  refilling oxybutynin.  Patient wants to try passing these at home.  She is to call her urologist and/or nephrologist to see what else needs to be done.  She has many bacteria in her urine, sending this off for culture.  Concern for secondary infection, giving 1 g of Rocephin, will send home with Keflex for complicated UTI, [id number] mg 4 times daily for 7 days.   Discussed labs, imaging, MDM, treatment plan, and plan for follow-up with patient. Discussed sn/sx that should prompt return to the ED. patient agrees with plan.   Meds ordered this encounter  Medications  . ketorolac (TORADOL) 30 MG/ML injection 30 mg  . cefTRIAXone (ROCEPHIN) injection 1 g  . oxybutynin (DITROPAN) 5 MG tablet    Sig: Take 1 tablet (5 mg total) by mouth every 8 (eight) hours as needed for up to 10 days for bladder spasms.    Dispense:  30 tablet    Refill:  0  . tamsulosin (FLOMAX) 0.4 MG CAPS capsule    Sig: Take 1 capsule (0.4 mg total) by mouth at bedtime for 7 days.    Dispense:  7 capsule    Refill:  0  . ibuprofen (ADVIL) 600 MG tablet    Sig: Take 1 tablet (600 mg total) by mouth every 6 (six) hours as needed.    Dispense:  30 tablet  Refill:  0  . cephALEXin (KEFLEX) 500 MG capsule    Sig: Take 1 capsule (500 mg total) by mouth 4 (four) times daily for 7 days.    Dispense:  28 capsule    Refill:  0    *This clinic note was created using [employer]. Therefore, there may be occasional mistakes despite careful proofreading.   ?    Melynda Ripple, MD 09/02/19 [phone]

## 2019-09-02 LAB — URINE CULTURE

## 2019-09-10 ENCOUNTER — Ambulatory Visit: Payer: BC Managed Care – PPO | Admitting: Internal Medicine

## 2019-09-11 DIAGNOSIS — R8271 Bacteriuria: Secondary | ICD-10-CM | POA: Diagnosis not present

## 2019-09-11 DIAGNOSIS — N23 Unspecified renal colic: Secondary | ICD-10-CM | POA: Diagnosis not present

## 2019-09-11 DIAGNOSIS — N202 Calculus of kidney with calculus of ureter: Secondary | ICD-10-CM | POA: Diagnosis not present

## 2019-09-11 DIAGNOSIS — N132 Hydronephrosis with renal and ureteral calculous obstruction: Secondary | ICD-10-CM | POA: Diagnosis not present

## 2019-09-19 ENCOUNTER — Other Ambulatory Visit: Payer: Self-pay | Admitting: Neurology

## 2019-09-19 ENCOUNTER — Other Ambulatory Visit: Payer: Self-pay | Admitting: Family Medicine

## 2019-09-30 DIAGNOSIS — N202 Calculus of kidney with calculus of ureter: Secondary | ICD-10-CM | POA: Diagnosis not present

## 2019-10-07 DIAGNOSIS — R1011 Right upper quadrant pain: Secondary | ICD-10-CM | POA: Diagnosis not present

## 2019-10-09 DIAGNOSIS — N200 Calculus of kidney: Secondary | ICD-10-CM | POA: Diagnosis not present

## 2019-10-12 ENCOUNTER — Other Ambulatory Visit: Payer: Self-pay | Admitting: Surgery

## 2019-10-12 DIAGNOSIS — R1011 Right upper quadrant pain: Secondary | ICD-10-CM

## 2019-10-19 DIAGNOSIS — M0579 Rheumatoid arthritis with rheumatoid factor of multiple sites without organ or systems involvement: Secondary | ICD-10-CM | POA: Diagnosis not present

## 2019-10-19 DIAGNOSIS — Z79899 Other long term (current) drug therapy: Secondary | ICD-10-CM | POA: Diagnosis not present

## 2019-10-21 ENCOUNTER — Ambulatory Visit: Payer: BC Managed Care – PPO | Admitting: Neurology

## 2019-10-26 DIAGNOSIS — Z79899 Other long term (current) drug therapy: Secondary | ICD-10-CM | POA: Diagnosis not present

## 2019-10-26 DIAGNOSIS — M0579 Rheumatoid arthritis with rheumatoid factor of multiple sites without organ or systems involvement: Secondary | ICD-10-CM | POA: Diagnosis not present

## 2019-10-27 ENCOUNTER — Ambulatory Visit
Admission: RE | Admit: 2019-10-27 | Discharge: 2019-10-27 | Disposition: A | Discharge status: Home / Self Care | Payer: BC Managed Care – PPO | Source: Ambulatory Visit | Attending: Surgery | Admitting: Surgery

## 2019-10-27 DIAGNOSIS — R1011 Right upper quadrant pain: Secondary | ICD-10-CM

## 2019-11-04 ENCOUNTER — Other Ambulatory Visit: Payer: Self-pay | Admitting: Family Medicine

## 2019-11-04 ENCOUNTER — Other Ambulatory Visit: Payer: Self-pay | Admitting: Neurology

## 2019-11-05 ENCOUNTER — Telehealth (HOSPITAL_COMMUNITY): Payer: Self-pay

## 2019-11-05 MED ORDER — LORAZEPAM 0.5 MG PO TABS: ORAL_TABLET | ORAL | 0 refills | Status: DC

## 2019-11-05 NOTE — Telephone Encounter (Signed)
30 day prescription sent to pharmacy.  

## 2019-11-05 NOTE — Telephone Encounter (Signed)
Medication refill request - Fax received from pt's [facility] for a refill of her prescribed Lorazepam pt takes only as needed.  Medication was continued 08/25/19 by Dr. Modesta Messing but no refills as pt had plenty at the time and takes PRN.  Patient's next appointment is with Dr. Toy Care, covering for Dr. Modesta Messing while out on leave on 12/07/19.

## 2019-11-11 ENCOUNTER — Telehealth: Payer: Self-pay

## 2019-11-11 NOTE — Telephone Encounter (Signed)
Disregard med refill request pt. Has been dismissed.

## 2019-11-11 NOTE — Telephone Encounter (Signed)
Received a fax from [facility] for a refill on Valtrex 1GM pt.

## 2019-11-20 ENCOUNTER — Ambulatory Visit (HOSPITAL_COMMUNITY): Payer: BC Managed Care – PPO | Admitting: Psychiatry

## 2019-12-07 ENCOUNTER — Ambulatory Visit (INDEPENDENT_AMBULATORY_CARE_PROVIDER_SITE_OTHER): Payer: BC Managed Care – PPO | Admitting: Psychiatry

## 2019-12-07 ENCOUNTER — Other Ambulatory Visit: Payer: Self-pay

## 2019-12-07 ENCOUNTER — Encounter: Payer: Self-pay | Admitting: Psychiatry

## 2019-12-07 DIAGNOSIS — F3341 Major depressive disorder, recurrent, in partial remission: Secondary | ICD-10-CM

## 2019-12-07 DIAGNOSIS — F4310 Post-traumatic stress disorder, unspecified: Secondary | ICD-10-CM

## 2019-12-07 MED ORDER — LORAZEPAM 0.5 MG PO TABS: ORAL_TABLET | ORAL | 1 refills | Status: DC

## 2019-12-07 MED ORDER — VENLAFAXINE HCL ER 150 MG PO CP24: ORAL_CAPSULE | ORAL | 0 refills | Status: DC

## 2019-12-07 MED ORDER — VENLAFAXINE HCL ER 75 MG PO CP24: ORAL_CAPSULE | ORAL | 0 refills | Status: DC

## 2019-12-07 NOTE — Progress Notes (Signed)
[place] MD OP Progress Note  I connected with  Joanne Garrett on 12/07/19 by a video enabled telemedicine application and verified that I am speaking with the correct person using two identifiers.   I discussed the limitations of evaluation and management by telemedicine. The patient expressed understanding and agreed to proceed.    12/07/2019 9:54 AM Joanne Garrett  MRN:  [id number]  Chief Complaint: " I am okay, bit stressed though."  HPI: Patient reported overall she is doing fine however the college related stress at work is getting to her.  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  This has increased stress levels significantly.  She informed that she is currently receiving IV infusion therapy for rheumatoid arthritis and as a result is pushing for being able to work remotely.  She informed that she plans to take the vaccine COVID-19 after she has completed her infusion therapy. She stated that she has been taking Ativan more frequently lately but for now is managing fine. Her sleep has not been that great but knows that this is due to the stress.  She has reached out to her therapist for a follow-up appointment and would like to work on the issue by talk therapy.  Visit Diagnosis:    ICD-10-CM   1. MDD (major depressive disorder), recurrent, in partial remission ([place])  F33.41   2. PTSD (post-traumatic stress disorder)  F43.10     Past Psychiatric History: depression, PTSD  Past Medical History:  Past Medical History:  Diagnosis Date  . Anxiety   . 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 Depression   . Genital herpes   . Hematuria   . History of hyperthyroidism 02/2001   s/p  RAI 03-2001  . History of panic attacks   . History of radiation therapy    completed  09-23-2012  50Gy in 25 fractions and boost completed [phone]  . History of thyroid nodule   . Hypertension   . Renal calculus, right   . Rheumatoid arthritis ([place])   . Trigeminal neuralgia of right side of face 04/15/2019   V2  . Urgency of urination     Past Surgical History:  Procedure Laterality Date  . ABDOMINAL HYSTERECTOMY    . ANTERIOR CERVICAL DECOMP/DISCECTOMY FUSION  05-18-2015   [facility]  . BREAST LUMPECTOMY Left 07-03-2012    [facility]  . CYSTOSCOPY WITH RETROGRADE PYELOGRAM, URETEROSCOPY AND STENT PLACEMENT Right 09/09/2017   Procedure: CYSTOSCOPY WITH RIGHT RETROGRADE PYELOGRAM, URETEROSCOPY , STONE BASKETRY AND STENT PLACEMENT;  Surgeon: Lucas Mallow, MD;  Location: [facility];  Service: Urology;  Laterality: Right;  . KNEE ARTHROSCOPY Left 2009  . TOTAL ABDOMINAL HYSTERECTOMY W/ BILATERAL SALPINGOOPHORECTOMY  2014    Family Psychiatric History: see below  Family History:  Family History  Problem Relation Age of Onset  . Hypertension Mother   . COPD Mother   . Depression Mother   . Hypertension Father   . Kidney disease Father   . Alcohol abuse Father   . Hypertension Brother   . Alzheimer's disease Paternal Grandmother     Social History:  Social History   Socioeconomic History  . Marital status: Single    Spouse name: Not on file  . Number of children: Not on file  . Years  of education: Not on file  . Highest education level: Not on file  Occupational History  . Not on file  Tobacco Use  . Smoking status: Never Smoker  . Smokeless tobacco: Never Used  Substance and Sexual Activity  . Alcohol use: No  . Drug use: No  . Sexual activity: Not Currently    Birth control/protection: Surgical  Other Topics Concern  . Not on file  Social History Narrative  . Not on file   Social Determinants of Health   Financial Resource Strain:   . Difficulty of Paying Living Expenses: Not on file  Food Insecurity:   . Worried About Paediatric nurse in the Last Year: Not on file  . Ran Out of Food in the Last Year: Not on file  Transportation Needs:   . Lack of Transportation (Medical): Not on file  . Lack of Transportation (Non-Medical): Not on file  Physical Activity:   . Days of Exercise per Week: Not on file  . Minutes of Exercise per Session: Not on file  Stress:   . Feeling of Stress : Not on file  Social Connections:   . Frequency of Communication with Friends and Family: Not on file  . Frequency of Social Gatherings with Friends and Family: Not on file  . Attends Religious Services: Not on file  . Active Member of Clubs or Organizations: Not on file  . Attends Archivist Meetings: Not on file  . Marital Status: Not on file    Allergies:  Allergies  Allergen Reactions  . Shellfish Allergy Itching and Other (See Comments)    HIVES IF GOES OVERBOARD, HAS HAD IV DYE AND HAD NO PROBLEMS  . Bactrim [Sulfamethoxazole-Trimethoprim] Hives and Other (See Comments)  . Chocolate Flavor Itching and Other (See Comments)    PLAIN CHOCOLATE IN ABUNDANCE    Metabolic Disorder Labs: No results found for: HGBA1C, MPG No results found for: PROLACTIN Lab Results  Component Value Date   CHOL 226 (H) 11/20/2018   TRIG 101 11/20/2018   HDL 75 11/20/2018   CHOLHDL 3.0 11/20/2018   VLDL 14 01/16/2017   LDLCALC 131 (H) 11/20/2018   LDLCALC 100 (H) 01/31/2018   Lab Results  Component Value Date   TSH 0.943 11/20/2018   TSH 0.712 03/24/2018    Therapeutic Level Labs: No results found for: LITHIUM No results found for: VALPROATE No components found for:  CBMZ  Current Medications: Current Outpatient Medications  Medication Sig Dispense Refill  . albuterol (VENTOLIN HFA) 108 (90 Base) MCG/ACT inhaler Inhale 1-2 puffs into the lungs every 6 (six) hours as needed for wheezing or shortness of breath. 8 g 0  . amLODipine (NORVASC) 10 MG tablet TAKE 1 TABLET BY MOUTH DAILY 30 tablet 5  . carbamazepine  (TEGRETOL) 200 MG tablet Take 1 tablet (200 mg total) by mouth 2 (two) times daily. 60 tablet 3  . folic acid (FOLVITE) 1 MG tablet Take 1 mg by mouth daily.    Marland Kitchen gabapentin (NEURONTIN) 600 MG tablet Take 1 tablet (600 mg total) by mouth 2 (two) times daily. 180 tablet 3  . golimumab (SIMPONI ARIA) 50 MG/4ML SOLN injection Inject into the vein.    Marland Kitchen ibuprofen (ADVIL) 600 MG tablet Take 1 tablet (600 mg total) by mouth every 6 (six) hours as needed. 30 tablet 0  . LORazepam (ATIVAN) 0.5 MG tablet 0.25-0.5 mg daily as needed for anxiety 30 tablet 0  . meclizine (ANTIVERT) 25 MG tablet  Take 1 tablet (25 mg total) by mouth 2 (two) times daily as needed for dizziness. 30 tablet 0  . methotrexate (50 MG/ML) 1 G injection Inject 40 mg into the vein once a week.     . Multiple Vitamin (MULTIVITAMIN) tablet Take 1 tablet by mouth daily.    . ondansetron (ZOFRAN) 4 MG tablet Take 1 tablet (4 mg total) by mouth every 8 (eight) hours as needed for nausea or vomiting. 30 tablet 2  . Peppermint Oil (IBGARD PO) Take by mouth.    . potassium chloride SA (K-DUR) 20 MEQ tablet TAKE 2 TABLETS BY MOUTH FOR 7 DAYS THEN 1 TABLET DAILY 30 tablet 0  . Probiotic Product (RESTORA PO) Take by mouth.    . valACYclovir (VALTREX) 1000 MG tablet Take 1 tablet (1,000 mg total) by mouth 2 (two) times daily as needed. X 5 days during an outbreak 30 tablet 2  . venlafaxine XR (EFFEXOR-XR) 150 MG 24 hr capsule Take total of 225 mg daily (150 mg + 75 mg daily) 90 capsule 1  . venlafaxine XR (EFFEXOR-XR) 75 MG 24 hr capsule 225 mg daily (150 mg + 75 mg daily) 90 capsule 1   No current facility-administered medications for this visit.    Musculoskeletal: Strength & Muscle Tone: unable to assess due to telemed visit Gait & Station: unable to assess due to telemed visit Patient leans: unable to assess due to telemed visit    Psychiatric Specialty Exam: Review of Systems  There were no vitals taken for this visit.There is no  height or weight on file to calculate BMI.  General Appearance: Well Groomed  Eye Contact:  Good  Speech:  Clear and Coherent and Normal Rate  Volume:  Normal  Mood:  Slightly depressed  Affect:  Congruent  Thought Process:  Goal Directed, Linear and Descriptions of Associations: Intact  Orientation:  Full (Time, Place, and Person)  Thought Content: Logical   Suicidal Thoughts:  No  Homicidal Thoughts:  No  Memory:  Recent;   Good Remote;   Good  Judgement:  Good  Insight:  Good  Psychomotor Activity:  Normal  Concentration:  Concentration: Good and Attention Span: Good  Recall:  Good  Fund of Knowledge: Good  Language: Good  Akathisia:  Negative  Handed:  Right  AIMS (if indicated): not done  Assets:  Communication Skills Desire for Improvement Financial Resources/Insurance Housing  ADL's:  Intact  Cognition: WNL  Sleep:  Fair     Screenings: GAD-7     Office Visit from 02/13/2017 in [facility]  Total GAD-7 Score  18    Mini-Mental     Office Visit from 09/18/2018 in [facility]  Total Score (max 30 points )  29    PHQ2-9     Office Visit from 01/31/2018 in [facility] Visit from 02/13/2017 in [facility] Visit from 01/16/2017 in [place]  PHQ-2 Total Score  0  1  0  PHQ-9 Total Score  --  11  --       Assessment and Plan: 43 y.o. year old female with a history of PTSD, depression,trigeminal neuralgia vs SUNCT,breast cancer/carcinoma in situ,hypertension, rheumatoid arthritis, history of hyperthyroidism , who seen for follow-up.  Patient reported feeling stressed due to COVID-19 related issues at work.  1. MDD (major depressive disorder), recurrent, in partial remission ([facility])  - venlafaxine XR (EFFEXOR-XR) 150 MG 24 hr capsule; Take total of 225 mg daily (  150 mg + 75 mg daily)  Dispense: 90 capsule; Refill: 0 - venlafaxine XR (EFFEXOR-XR) 75 MG 24 hr capsule; 225 mg daily  (150 mg + 75 mg daily)  Dispense: 90 capsule; Refill: 0 - LORazepam (ATIVAN) 0.5 MG tablet; 0.25-0.5 mg daily as needed for anxiety  Dispense: 30 tablet; Refill: 1  2. PTSD (post-traumatic stress disorder)  - venlafaxine XR (EFFEXOR-XR) 150 MG 24 hr capsule; Take total of 225 mg daily (150 mg + 75 mg daily)  Dispense: 90 capsule; Refill: 0 - venlafaxine XR (EFFEXOR-XR) 75 MG 24 hr capsule; 225 mg daily (150 mg + 75 mg daily)  Dispense: 90 capsule; Refill: 0 - LORazepam (ATIVAN) 0.5 MG tablet; 0.25-0.5 mg daily as needed for anxiety  Dispense: 30 tablet; Refill: 1   Continue same medication regimen. Follow up in 2 months. Pt is restarting therapy sessions.   [place] Crane, MD 12/07/2019, 9:54 AM

## 2019-12-08 ENCOUNTER — Telehealth (HOSPITAL_COMMUNITY): Payer: Self-pay | Admitting: *Deleted

## 2019-12-08 NOTE — Telephone Encounter (Signed)
Yes, I understand. Please advice her to use her best judgment. She had told me yesterday during her appointment that she plans to reach out to her therapist to resume therapy sessions. Will recommend that she does that asap.

## 2019-12-08 NOTE — Telephone Encounter (Signed)
Spoke with patient & reminded to f/u with therapist. Notified unable to see therapist until 01/12/20. And informed that she does have FMLA

## 2019-12-08 NOTE — Telephone Encounter (Signed)
Spoke with patient & I did inform/offer earliest available with you & Dr Modesta Messing.  She stated that her anxiety today @ work they pushed all her buttons & she's either going to a walk  or just take some days off work. I did inform/offer earliest available with you & Dr Modesta Messing

## 2019-12-08 NOTE — Telephone Encounter (Signed)
Patient called requesting a appt. With  psychiatrist/ Informed she just had a appt yesterday with a psychiatrist Dr Toy Care. Then asked her what's going on? She stated "oh my god it's work: my anxiety with a little bit of anger sneaking in"

## 2019-12-08 NOTE — Telephone Encounter (Signed)
I just spoke with her yesterday and understood her concerns due to [place] 19 related stress at work. How may I help her ? Offer earliest available appointment with Dr. Modesta Messing or myself.

## 2019-12-23 ENCOUNTER — Ambulatory Visit
Admission: RE | Admit: 2019-12-23 | Discharge: 2019-12-23 | Disposition: A | Discharge status: Home / Self Care | Payer: BC Managed Care – PPO | Source: Ambulatory Visit | Attending: Family Medicine | Admitting: Family Medicine

## 2019-12-23 ENCOUNTER — Other Ambulatory Visit: Payer: Self-pay

## 2019-12-23 DIAGNOSIS — Z1231 Encounter for screening mammogram for malignant neoplasm of breast: Secondary | ICD-10-CM

## 2020-01-06 DIAGNOSIS — B009 Herpesviral infection, unspecified: Secondary | ICD-10-CM | POA: Insufficient documentation

## 2020-01-15 ENCOUNTER — Ambulatory Visit: Payer: PRIVATE HEALTH INSURANCE | Attending: Internal Medicine

## 2020-01-15 DIAGNOSIS — Z23 Encounter for immunization: Secondary | ICD-10-CM

## 2020-01-15 NOTE — Progress Notes (Signed)
   Covid-19 Vaccination Clinic  Name:  Joanne Garrett    MRN: [id number] DOB: December 19, 1975  01/15/2020  [name] was observed post Covid-19 immunization for 15 minutes without incident. She was provided with Vaccine Information Sheet and instruction to access the V-Safe system.   [name] was instructed to call 911 with any severe reactions post vaccine: Marland Kitchen Difficulty breathing  . Swelling of face and throat  . A fast heartbeat  . A bad rash all over body  . Dizziness and weakness   Immunizations Administered    Name Date Dose VIS Date Route   Pfizer COVID-19 Vaccine 01/15/2020  8:17 AM 0.3 mL 10/16/2019 Intramuscular   Manufacturer: [place]   Lot: [id number]   [place]: [id number]

## 2020-01-20 ENCOUNTER — Other Ambulatory Visit (HOSPITAL_COMMUNITY): Payer: Self-pay | Admitting: Nurse Practitioner

## 2020-01-20 DIAGNOSIS — K760 Fatty (change of) liver, not elsewhere classified: Secondary | ICD-10-CM

## 2020-01-20 DIAGNOSIS — Z79631 Long term (current) use of antimetabolite agent: Secondary | ICD-10-CM

## 2020-01-20 DIAGNOSIS — Z79899 Other long term (current) drug therapy: Secondary | ICD-10-CM
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
## 2020-01-28 ENCOUNTER — Ambulatory Visit (HOSPITAL_COMMUNITY): Payer: PRIVATE HEALTH INSURANCE

## 2020-01-28 ENCOUNTER — Other Ambulatory Visit (HOSPITAL_COMMUNITY): Payer: PRIVATE HEALTH INSURANCE

## 2020-01-31 ENCOUNTER — Other Ambulatory Visit: Payer: Self-pay | Admitting: Radiology

## 2020-02-02 ENCOUNTER — Encounter (HOSPITAL_COMMUNITY): Payer: Self-pay

## 2020-02-02 ENCOUNTER — Other Ambulatory Visit (HOSPITAL_COMMUNITY): Payer: Self-pay | Admitting: Nurse Practitioner

## 2020-02-02 ENCOUNTER — Ambulatory Visit (HOSPITAL_COMMUNITY)
Admission: RE | Admit: 2020-02-02 | Discharge: 2020-02-02 | Disposition: A | Discharge status: Home / Self Care | Payer: No Typology Code available for payment source | Source: Ambulatory Visit | Attending: Nurse Practitioner | Admitting: Nurse Practitioner

## 2020-02-02 ENCOUNTER — Other Ambulatory Visit: Payer: Self-pay

## 2020-02-02 DIAGNOSIS — Z853 Personal history of malignant neoplasm of breast: Secondary | ICD-10-CM | POA: Diagnosis not present

## 2020-02-02 DIAGNOSIS — M069 Rheumatoid arthritis, unspecified: Secondary | ICD-10-CM | POA: Insufficient documentation

## 2020-02-02 DIAGNOSIS — F329 Major depressive disorder, single episode, unspecified: Secondary | ICD-10-CM | POA: Insufficient documentation

## 2020-02-02 DIAGNOSIS — Z79899 Other long term (current) drug therapy: Secondary | ICD-10-CM

## 2020-02-02 DIAGNOSIS — K760 Fatty (change of) liver, not elsewhere classified: Secondary | ICD-10-CM

## 2020-02-02 DIAGNOSIS — F419 Anxiety disorder, unspecified: Secondary | ICD-10-CM | POA: Diagnosis not present

## 2020-02-02 DIAGNOSIS — Z79631 Long term (current) use of antimetabolite agent: Secondary | ICD-10-CM

## 2020-02-02 DIAGNOSIS — Z923 Personal history of irradiation: Secondary | ICD-10-CM | POA: Diagnosis not present

## 2020-02-02 DIAGNOSIS — I10 Essential (primary) hypertension: Secondary | ICD-10-CM | POA: Diagnosis not present

## 2020-02-02 HISTORY — PX: IR TRANSCATHETER BX: IMG713

## 2020-02-02 HISTORY — PX: IR VENOGRAM HEPATIC W HEMODYNAMIC EVALUATION: IMG692

## 2020-02-02 HISTORY — PX: IR US GUIDE VASC ACCESS RIGHT: IMG2390

## 2020-02-02 LAB — COMPREHENSIVE METABOLIC PANEL
ALT: 23 U/L (ref 0–44)
AST: 22 U/L (ref 15–41)
Albumin: 4.5 g/dL (ref 3.5–5.0)
Alkaline Phosphatase: 83 U/L (ref 38–126)
Anion gap: 11 (ref 5–15)
BUN: 10 mg/dL (ref 6–20)
CO2: 27 mmol/L (ref 22–32)
Calcium: 8.8 mg/dL — ABNORMAL LOW (ref 8.9–10.3)
Chloride: 105 mmol/L (ref 98–111)
Creatinine, Ser: 0.61 mg/dL (ref 0.44–1.00)
GFR calc Af Amer: >60 mL/min (ref >60)
GFR calc non Af Amer: >60 mL/min (ref >60)
Glucose, Bld: 96 mg/dL (ref 70–99)
Potassium: 3 mmol/L — ABNORMAL LOW (ref 3.5–5.1)
Sodium: 143 mmol/L (ref 135–145)
Total Bilirubin: 0.5 mg/dL (ref 0.3–1.2)
Total Protein: 7.9 g/dL (ref 6.5–8.1)

## 2020-02-02 LAB — CBC WITH DIFFERENTIAL/PLATELET
Abs Immature Granulocytes: 0.01 10*3/uL (ref 0.00–0.07)
Basophils Absolute: 0 10*3/uL (ref 0.0–0.1)
Basophils Relative: 1 %
Eosinophils Absolute: 0.2 10*3/uL (ref 0.0–0.5)
Eosinophils Relative: 2 %
HCT: 41.9 % (ref 36.0–46.0)
Hemoglobin: 13.3 g/dL (ref 12.0–15.0)
Immature Granulocytes: 0 %
Lymphocytes Relative: 35 %
Lymphs Abs: 2.4 10*3/uL (ref 0.7–4.0)
MCH: 27.6 pg (ref 26.0–34.0)
MCHC: 31.7 g/dL (ref 30.0–36.0)
MCV: 86.9 fL (ref 80.0–100.0)
Monocytes Absolute: 0.5 10*3/uL (ref 0.1–1.0)
Monocytes Relative: 8 %
Neutro Abs: 3.8 10*3/uL (ref 1.7–7.7)
Neutrophils Relative %: 54 %
Platelets: 253 10*3/uL (ref 150–400)
RBC: 4.82 MIL/uL (ref 3.87–5.11)
RDW: 13.8 % (ref 11.5–15.5)
WBC: 6.9 10*3/uL (ref 4.0–10.5)
nRBC: 0 % (ref 0.0–0.2)

## 2020-02-02 LAB — PROTIME-INR
INR: 1 (ref 0.8–1.2)
Prothrombin Time: 12.6 seconds (ref 11.4–15.2)

## 2020-02-02 MED ORDER — LIDOCAINE HCL (PF) 1 % IJ SOLN
INTRAMUSCULAR | Status: AC | PRN
  Administered 2020-02-02: 2 mL

## 2020-02-02 MED ORDER — LIDOCAINE HCL 1 % IJ SOLN
INTRAMUSCULAR | Status: AC
  Filled 2020-02-02: qty 20

## 2020-02-02 MED ORDER — MIDAZOLAM HCL 2 MG/2ML IJ SOLN
INTRAMUSCULAR | Status: AC | PRN
  Administered 2020-02-02: 0.5 mg via INTRAVENOUS
  Administered 2020-02-02: 1 mg via INTRAVENOUS

## 2020-02-02 MED ORDER — MIDAZOLAM HCL 2 MG/2ML IJ SOLN
INTRAMUSCULAR | Status: AC
  Filled 2020-02-02: qty 2

## 2020-02-02 MED ORDER — FENTANYL CITRATE (PF) 100 MCG/2ML IJ SOLN
INTRAMUSCULAR | Status: AC
  Filled 2020-02-02: qty 2

## 2020-02-02 MED ORDER — FENTANYL CITRATE (PF) 100 MCG/2ML IJ SOLN
INTRAMUSCULAR | Status: AC | PRN
  Administered 2020-02-02 (×2): 25 ug via INTRAVENOUS

## 2020-02-02 MED ORDER — IOHEXOL 300 MG/ML  SOLN
50.0000 mL | Freq: Once | INTRAMUSCULAR | Status: AC | PRN
  Administered 2020-02-02: 35 mL via INTRAVENOUS

## 2020-02-02 MED ORDER — SODIUM CHLORIDE 0.9 % IV SOLN: INTRAVENOUS | Status: DC

## 2020-02-02 NOTE — Procedures (Signed)
Interventional Radiology Procedure Note  Procedure: Hepatic venography, wedged and free hepatic vein pressure measurements and transjugular liver biopsy  Complications: None  Estimated Blood Loss: < 10 mL  Findings: Normal right and middle hepatic vein patency.  Free hepatic vein pressure 9/6 (7) mm Hg Wedged hepatic vein pressure 11/8 (9) mm Hg  Transvenous 19 G core biopsy of liver x 3 via middle hepatic vein. No complications.  Venetia Night. Kathlene Cote, M.D Pager:  [phone]

## 2020-02-02 NOTE — Discharge Instructions (Signed)
Liver Biopsy, Care After These instructions give you information on caring for yourself after your procedure. Your doctor may also give you more specific instructions. Call your doctor if you have any problems or questions after your procedure. What can I expect after the procedure? After the procedure, it is common to have:  Pain and soreness where the biopsy was done.  Bruising around the area where the biopsy was done.  Sleepiness and be tired for a few days. Follow these instructions at home: Medicines  Take over-the-counter and prescription medicines only as told by your doctor.  If you were prescribed an antibiotic medicine, take it as told by your doctor. Do not stop taking the antibiotic even if you start to feel better.  Do not take medicines such as aspirin and ibuprofen. These medicines can thin your blood. Do not take these medicines unless your doctor tells you to take them.  If you are taking prescription pain medicine, take actions to prevent or treat constipation. Your doctor may recommend that you: ? Drink enough fluid to keep your pee (urine) clear or pale yellow. ? Take over-the-counter or prescription medicines. ? Eat foods that are high in fiber, such as fresh fruits and vegetables, whole grains, and beans. ? Limit foods that are high in fat and processed sugars, such as fried and sweet foods. Caring for your cut  Follow instructions from your doctor about how to take care of your cuts from surgery (incisions). Make sure you: ? Wash your hands with soap and water before you change your bandage (dressing). If you cannot use soap and water, use hand sanitizer. ? Change your bandage as told by your doctor. ? Leave stitches (sutures), skin glue, or skin tape (adhesive) strips in place. They may need to stay in place for 2 weeks or longer. If tape strips get loose and curl up, you may trim the loose edges. Do not remove tape strips completely unless your doctor says it is  okay.  Check your cuts every day for signs of infection. Check for: ? Redness, swelling, or more pain. ? Fluid or blood. ? Pus or a bad smell. ? Warmth.  Do not take baths, swim, or use a hot tub until your doctor says it is okay to do so. Activity   Rest at home for 1-2 days or as told by your doctor. ? Avoid sitting for a long time without moving. Get up to take short walks every 1-2 hours.  Return to your normal activities as told by your doctor. Ask what activities are safe for you.  Do not do these things in the first 24 hours: ? Drive. ? Use machinery. ? Take a bath or shower.  Do not lift more than 10 pounds (4.5 kg) or play contact sports for the first 2 weeks. General instructions   Do not drink alcohol in the first week after the procedure.  Have someone stay with you for at least 24 hours after the procedure.  Get your test results. Ask your doctor or the department that is doing the test: ? When will my results be ready? ? How will I get my results? ? What are my treatment options? ? What other tests do I need? ? What are my next steps?  Keep all follow-up visits as told by your doctor. This is important. Contact a doctor if:  A cut bleeds and leaves more than just a small spot of blood.  A cut is red, puffs up (  swells), or hurts more than before.  Fluid or something else comes from a cut.  A cut smells bad.  You have a fever or chills. Get help right away if:  You have swelling, bloating, or pain in your belly (abdomen).  You get dizzy or faint.  You have a rash.  You feel sick to your stomach (nauseous) or throw up (vomit).  You have trouble breathing, feel short of breath, or feel faint.  Your chest hurts.  You have problems talking or seeing.  You have trouble with your balance or moving your arms or legs. Summary  After the procedure, it is common to have pain, soreness, bruising, and tiredness.  Your doctor will tell you how to  take care of yourself at home. Change your bandage, take your medicines, and limit your activities as told by your doctor.  Call your doctor if you have symptoms of infection. Get help right away if your belly swells, your cut bleeds a lot, or you have trouble talking or breathing. This information is not intended to replace advice given to you by your health care provider. Make sure you discuss any questions you have with your health care provider. Document Revised: 11/01/2017 Document Reviewed: 11/01/2017 Elsevier Patient Education  2020 [employer]. Moderate Conscious Sedation, Adult, Care After These instructions provide you with information about caring for yourself after your procedure. Your health care provider may also give you more specific instructions. Your treatment has been planned according to current medical practices, but problems sometimes occur. Call your health care provider if you have any problems or questions after your procedure. What can I expect after the procedure? After your procedure, it is common:  To feel sleepy for several hours.  To feel clumsy and have poor balance for several hours.  To have poor judgment for several hours.  To vomit if you eat too soon. Follow these instructions at home: For at least 24 hours after the procedure:   Do not: ? Participate in activities where you could fall or become injured. ? Drive. ? Use heavy machinery. ? Drink alcohol. ? Take sleeping pills or medicines that cause drowsiness. ? Make important decisions or sign legal documents. ? Take care of children on your own.  Rest. Eating and drinking  Follow the diet recommended by your health care provider.  If you vomit: ? Drink water, juice, or soup when you can drink without vomiting. ? Make sure you have little or no nausea before eating solid foods. General instructions  Have a responsible adult stay with you until you are awake and alert.  Take  over-the-counter and prescription medicines only as told by your health care provider.  If you smoke, do not smoke without supervision.  Keep all follow-up visits as told by your health care provider. This is important. Contact a health care provider if:  You keep feeling nauseous or you keep vomiting.  You feel light-headed.  You develop a rash.  You have a fever. Get help right away if:  You have trouble breathing. This information is not intended to replace advice given to you by your health care provider. Make sure you discuss any questions you have with your health care provider. Document Revised: 10/04/2017 Document Reviewed: 02/11/2016 Elsevier Patient Education  2020 Elsevier Inc.  

## 2020-02-02 NOTE — Consult Note (Addendum)
Chief Complaint: Patient was seen in consultation today for image guided transjugular liver biopsy  Referring Physician(s): Drazek,Dawn,NP  Supervising Physician: Aletta Edouard  Patient Status: [facility] - Out-pt  History of Present Illness: Joanne Garrett is a 44 y.o. female with prior history of left breast cancer in 2013, hypertension, anxiety/depression, nephrolithiasis, hyperthyroidism, rheumatoid arthritis previously on methotrexate and cirrhosis/fatty liver by ultrasound.  She presents today for transjugular liver biopsy for further evaluation/hepatic -portal vein pressure measurements.  Past Medical History:  Diagnosis Date  . Anxiety   . 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 Depression   . Genital herpes   . Hematuria   . History of hyperthyroidism 02/2001   s/p  RAI 03-2001  . History of panic attacks   . History of radiation therapy    completed 09-23-2012  50Gy in 25 fractions and boost completed [phone]  . History of thyroid nodule   . Hypertension   . Renal calculus, right   . Rheumatoid arthritis ([place])   . Trigeminal neuralgia of right side of face 04/15/2019   V2  . Urgency of urination     Past Surgical History:  Procedure Laterality Date  . ABDOMINAL HYSTERECTOMY    . ANTERIOR CERVICAL DECOMP/DISCECTOMY FUSION  05-18-2015   [facility]  . BREAST LUMPECTOMY Left 07-03-2012    [facility]  . CYSTOSCOPY WITH RETROGRADE PYELOGRAM, URETEROSCOPY AND STENT PLACEMENT Right 09/09/2017   Procedure: CYSTOSCOPY WITH RIGHT RETROGRADE PYELOGRAM, URETEROSCOPY , STONE BASKETRY AND STENT PLACEMENT;  Surgeon: Lucas Mallow, MD;  Location: [facility];  Service: Urology;  Laterality: Right;  . KNEE ARTHROSCOPY Left 2009  . TOTAL ABDOMINAL HYSTERECTOMY W/ BILATERAL SALPINGOOPHORECTOMY  2014     Allergies: Shellfish allergy, Bactrim [sulfamethoxazole-trimethoprim], and Chocolate flavor  Medications: Prior to Admission medications   Medication Sig Start Date End Date Taking? Authorizing Provider  amLODipine (NORVASC) 10 MG tablet TAKE 1 TABLET BY MOUTH DAILY 01/02/19  Yes Henson, Vickie L, NP-C  carbamazepine (TEGRETOL) 200 MG tablet Take 1 tablet (200 mg total) by mouth 2 (two) times daily. 07/30/19  Yes Kathrynn Ducking, MD  folic acid (FOLVITE) 1 MG tablet Take 1 mg by mouth daily. 10/20/18  Yes [provider]  gabapentin (NEURONTIN) 600 MG tablet Take 1 tablet (600 mg total) by mouth 2 (two) times daily. 04/15/19  Yes Kathrynn Ducking, MD  LORazepam (ATIVAN) 0.5 MG tablet 0.25-0.5 mg daily as needed for anxiety 12/07/19  Yes Nevada Crane, MD  Multiple Vitamin (MULTIVITAMIN) tablet Take 1 tablet by mouth daily.   Yes [provider]  potassium chloride SA (K-DUR) 20 MEQ tablet TAKE 2 TABLETS BY MOUTH FOR 7 DAYS THEN 1 TABLET DAILY 03/09/19  Yes Henson, Vickie L, NP-C  Probiotic Product (RESTORA PO) Take by mouth.   Yes [provider]  venlafaxine XR (EFFEXOR-XR) 150 MG 24 hr capsule Take total of 225 mg daily (150 mg + 75 mg daily) 12/07/19  Yes Nevada Crane, MD  venlafaxine XR (EFFEXOR-XR) 75 MG 24 hr capsule 225 mg daily (150 mg + 75 mg daily) 12/07/19  Yes Nevada Crane, MD  albuterol (VENTOLIN HFA) 108 (90 Base) MCG/ACT inhaler Inhale 1-2 puffs into the lungs every 6 (six) hours as needed for wheezing or shortness of breath. 08/23/19   Wieters, Hallie C, [name]  golimumab ([place]  ARIA) 50 MG/4ML SOLN injection Inject into the vein. 03/03/19   [provider]  ibuprofen (ADVIL) 600 MG tablet Take 1 tablet (600 mg total) by mouth every 6 (six) hours as needed. 09/01/19   Melynda Ripple, MD  meclizine (ANTIVERT) 25 MG tablet Take 1 tablet (25 mg total) by mouth 2 (two) times daily as needed for dizziness. 08/25/18   Henson, Vickie L, NP-C   methotrexate (50 MG/ML) 1 G injection Inject 40 mg into the vein once a week.     [provider]  ondansetron (ZOFRAN) 4 MG tablet Take 1 tablet (4 mg total) by mouth every 8 (eight) hours as needed for nausea or vomiting. 10/17/18   Kathrynn Ducking, MD  Peppermint Oil (IBGARD PO) Take by mouth.    [provider]  valACYclovir (VALTREX) 1000 MG tablet Take 1 tablet (1,000 mg total) by mouth 2 (two) times daily as needed. X 5 days during an outbreak 08/15/18   Girtha Rm, NP-C     Family History  Problem Relation Age of Onset  . Hypertension Mother   . COPD Mother   . Depression Mother   . Hypertension Father   . Kidney disease Father   . Alcohol abuse Father   . Hypertension Brother   . Alzheimer's disease Paternal Grandmother     Social History   Socioeconomic History  . Marital status: Single    Spouse name: Not on file  . Number of children: Not on file  . Years of education: Not on file  . Highest education level: Not on file  Occupational History  . Not on file  Tobacco Use  . Smoking status: Never Smoker  . Smokeless tobacco: Never Used  Substance and Sexual Activity  . Alcohol use: No  . Drug use: No  . Sexual activity: Not Currently    Birth control/protection: Surgical  Other Topics Concern  . Not on file  Social History Narrative  . Not on file   Social Determinants of Health   Financial Resource Strain:   . Difficulty of Paying Living Expenses:   Food Insecurity:   . Worried About Charity fundraiser in the Last Year:   . Arboriculturist in the Last Year:   Transportation Needs:   . Film/video editor (Medical):   Marland Kitchen Lack of Transportation (Non-Medical):   Physical Activity:   . Days of Exercise per Week:   . Minutes of Exercise per Session:   Stress:   . Feeling of Stress :   Social Connections:   . Frequency of Communication with Friends and Family:   . Frequency of Social Gatherings with Friends and Family:   .  Attends Religious Services:   . Active Member of Clubs or Organizations:   . Attends Archivist Meetings:   Marland Kitchen Marital Status:       Review of Systems currently denies fever, headache, chest pain, dyspnea, cough, abdominal/back pain, nausea, vomiting or bleeding  Vital Signs: Blood pressure 151/106, temperature 98.7, heart rate 90, respirations 18, O2 sat 100% room air Ht 5\' 3"  (1.6 m)   Wt 208 lb (94.3 kg)   LMP 06/11/2013   BMI 36.85 kg/m   Physical Exam awake, alert.  Chest with slightly diminished breath sounds at bases.  Heart with regular rate and rhythm.  Abdomen  soft, positive bowel sounds, nontender.  No lower extremity edema.  Imaging: No results found.  Labs:  CBC: Recent Labs  08/31/19 2044 02/02/20 1026  WBC 10.9* 6.9  HGB 12.8 13.3  HCT 39.8 41.9  PLT 245 253    COAGS: Recent Labs    02/02/20 1026  INR 1.0    BMP: Recent Labs    08/31/19 2044 02/02/20 1026  NA 140 143  K 3.1* 3.0*  CL 103 105  CO2 25 27  GLUCOSE 95 96  BUN 8 10  CALCIUM 9.4 8.8*  CREATININE 0.59 0.61  GFRNONAA >60 >60  GFRAA >60 >60    LIVER FUNCTION TESTS: Recent Labs    08/31/19 2044 02/02/20 1026  BILITOT 0.1* 0.5  AST 20 22  ALT 25 23  ALKPHOS 66 83  PROT 7.4 7.9  ALBUMIN 4.1 4.5    TUMOR MARKERS: No results for input(s): AFPTM, CEA, CA199, CHROMGRNA in the last 8760 hours.  Assessment and Plan: 44 y.o. female with prior history of left breast cancer in 2013, hypertension, anxiety/depression, nephrolithiasis, hyperthyroidism, rheumatoid arthritis previously on methotrexate and cirrhosis/fatty liver by ultrasound.  She presents today for transjugular liver biopsy for further evaluation/hepatic -portal vein pressure measurements.  Details/risks of procedure, including but not limited to, internal bleeding, infection, injury to adjacent structures discussed with patient with her understanding and consent.   Thank you for this interesting  consult.  I greatly enjoyed meeting Vlora Manes and look forward to participating in their care.  A copy of this report was sent to the requesting provider on this date.  Electronically Signed: D. Rowe Robert, [name] 02/02/2020, 11:23 AM   I spent a total of 25 minutes   in face to face in clinical consultation, greater than 50% of which was counseling/coordinating care for image guided transjugular liver biopsy

## 2020-02-03 ENCOUNTER — Other Ambulatory Visit: Payer: Self-pay

## 2020-02-03 LAB — SURGICAL PATHOLOGY

## 2020-02-04 ENCOUNTER — Encounter (HOSPITAL_COMMUNITY): Payer: Self-pay | Admitting: Psychiatry

## 2020-02-04 ENCOUNTER — Other Ambulatory Visit: Payer: Self-pay

## 2020-02-04 ENCOUNTER — Ambulatory Visit (INDEPENDENT_AMBULATORY_CARE_PROVIDER_SITE_OTHER): Payer: PRIVATE HEALTH INSURANCE | Admitting: Psychiatry

## 2020-02-04 DIAGNOSIS — G4700 Insomnia, unspecified: Secondary | ICD-10-CM

## 2020-02-04 DIAGNOSIS — F3341 Major depressive disorder, recurrent, in partial remission: Secondary | ICD-10-CM

## 2020-02-04 DIAGNOSIS — F4310 Post-traumatic stress disorder, unspecified: Secondary | ICD-10-CM

## 2020-02-04 DIAGNOSIS — F419 Anxiety disorder, unspecified: Secondary | ICD-10-CM

## 2020-02-04 MED ORDER — VENLAFAXINE HCL ER 75 MG PO CP24: ORAL_CAPSULE | ORAL | 0 refills | Status: DC

## 2020-02-04 MED ORDER — VENLAFAXINE HCL ER 150 MG PO CP24: ORAL_CAPSULE | ORAL | 0 refills | Status: DC

## 2020-02-04 NOTE — Patient Instructions (Addendum)
1.Continuevenlafaxine225mg  daily  2. Continue ativan 0.25-0.5 mg daily as needed for anxiety 3. Next appointment: 7/22 at 8:30

## 2020-02-08 ENCOUNTER — Ambulatory Visit: Payer: Self-pay

## 2020-02-09 ENCOUNTER — Ambulatory Visit: Payer: PRIVATE HEALTH INSURANCE | Attending: Internal Medicine

## 2020-02-09 DIAGNOSIS — Z23 Encounter for immunization: Secondary | ICD-10-CM

## 2020-02-09 NOTE — Progress Notes (Signed)
   Covid-19 Vaccination Clinic  Name:  Joanne Garrett    MRN: [id number] DOB: Apr 23, 1976  02/09/2020  Ms. Head was observed post Covid-19 immunization for 15 minutes without incident. She was provided with Vaccine Information Sheet and instruction to access the V-Safe system.   [name] was instructed to call 911 with any severe reactions post vaccine: Marland Kitchen Difficulty breathing  . Swelling of face and throat  . A fast heartbeat  . A bad rash all over body  . Dizziness and weakness   Immunizations Administered    Name Date Dose VIS Date Route   Pfizer COVID-19 Vaccine 02/09/2020  8:21 AM 0.3 mL 10/16/2019 Intramuscular   Manufacturer: [place]   Lot: [id number]   [place]: [id number]

## 2020-04-18 ENCOUNTER — Other Ambulatory Visit: Payer: Self-pay

## 2020-04-18 ENCOUNTER — Telehealth: Payer: Self-pay | Admitting: Adult Health

## 2020-04-18 MED ORDER — CARBAMAZEPINE 200 MG PO TABS: 200.0000 mg | ORAL_TABLET | Freq: Two times a day (BID) | ORAL | 2 refills | Status: DC

## 2020-04-18 NOTE — Telephone Encounter (Signed)
I called pt and gave her a 2 month refill until her appt on July 27 with Sarah NP. Pt verbalized understands and knows to clock in at 0815 for 0845 appt.

## 2020-04-18 NOTE — Telephone Encounter (Signed)
Patient called LVM. Needs appointment and refills. Ok to to leave detailed VM with an appointment. Any appointment will work for her.
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
## 2020-05-26 ENCOUNTER — Other Ambulatory Visit: Payer: Self-pay

## 2020-05-26 ENCOUNTER — Telehealth (INDEPENDENT_AMBULATORY_CARE_PROVIDER_SITE_OTHER): Payer: PRIVATE HEALTH INSURANCE | Admitting: Psychiatry

## 2020-05-26 ENCOUNTER — Encounter (HOSPITAL_COMMUNITY): Payer: Self-pay | Admitting: Psychiatry

## 2020-05-26 DIAGNOSIS — F4310 Post-traumatic stress disorder, unspecified: Secondary | ICD-10-CM

## 2020-05-26 DIAGNOSIS — F3341 Major depressive disorder, recurrent, in partial remission: Secondary | ICD-10-CM | POA: Diagnosis not present

## 2020-05-26 MED ORDER — VENLAFAXINE HCL ER 150 MG PO CP24: ORAL_CAPSULE | ORAL | 0 refills | Status: DC

## 2020-05-26 MED ORDER — VENLAFAXINE HCL ER 75 MG PO CP24: ORAL_CAPSULE | ORAL | 0 refills | Status: DC

## 2020-05-26 NOTE — Patient Instructions (Signed)
1.Continuevenlafaxine225mg  daily  2. Continue ativan 0.25-0.5 mg daily as needed for anxiety 3. Next appointment: 10/14 at 8:30

## 2020-05-31 ENCOUNTER — Encounter: Payer: Self-pay | Admitting: Neurology

## 2020-05-31 ENCOUNTER — Ambulatory Visit (INDEPENDENT_AMBULATORY_CARE_PROVIDER_SITE_OTHER): Payer: No Typology Code available for payment source | Admitting: Neurology

## 2020-05-31 ENCOUNTER — Telehealth: Payer: Self-pay | Admitting: Neurology

## 2020-05-31 VITALS — BP 150/104 | HR 85 | Ht 63.0 in | Wt 213.0 lb

## 2020-05-31 DIAGNOSIS — R531 Weakness: Secondary | ICD-10-CM | POA: Diagnosis not present

## 2020-05-31 DIAGNOSIS — G500 Trigeminal neuralgia: Secondary | ICD-10-CM | POA: Diagnosis not present

## 2020-05-31 MED ORDER — CARBAMAZEPINE 200 MG PO TABS: 200.0000 mg | ORAL_TABLET | Freq: Two times a day (BID) | ORAL | 5 refills | Status: DC

## 2020-05-31 NOTE — Telephone Encounter (Signed)
Medcost order sent to GI. They will obtain the auth and reach out to the patient to schedule.

## 2020-05-31 NOTE — Progress Notes (Signed)
PATIENT: Joanne Garrett DOB: 1976-02-02  REASON FOR VISIT: follow up HISTORY FROM: patient  HISTORY OF PRESENT ILLNESS: Today 05/31/20  Ms. Top is a 44 year old female with history of RA, PTSD, and right-sided facial pain in the V2 distribution.  Could be trigeminal neuralgia, occasional watering of the right eye makes it necessary to consider SUNCT headache.  She has benefited from gabapentin, carbamazepine was added.  Pain is overall well controlled, does have some drowsiness secondary to gabapentin.  Today, mentions is concerned she may have had a TIA last week.  For several days, had left arm weakness, did not think much about it.  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  Felt okay the next day.  On Sunday night, was driving home, had some blurry vision, dull left headache.  Weakness had subsided at that point.  On Monday, she rested, didn't go to work.  Today, no deficits, does have mild left-sided headache.  Has history of migraines years ago, doesn't feel like that.  Has hypertension, did not take BP medication today.  Is seeing provider at urgent care/primary care.  Recently seen, returning soon, on amlodipine, plan to add HCTZ.  Reports cholesterol was checked recently, was normal.  Recently had a liver biopsy.  Denies any speech difficulty, or changes to the walking or balance.  Presents today for evaluation unaccompanied.   HISTORY 04/15/2019 Dr. Jannifer Franklin: Joanne Garrett is a 44 year old right-handed black female with a history of rheumatoid arthritis, and a history of posttraumatic stress disorder.  The patient is being followed through this office for right-sided facial pain in the V2 distribution.  This may represent trigeminal neuralgia, but the patient does report some occasional watering of the right eye, and a SUNCT type headache may need to be considered  as well.  At any rate, the patient seems to have gained benefit with the use of gabapentin, she currently is taking 600 mg twice daily and tolerates the medication well.  She has reported memory issues in the past but she claims that this is doing better for her.  She will be going for a sleep study in July 2020.  The patient does have some difficulty getting to sleep at night at times.  She used melatonin but this did not really help much.  She overall claims that she feels good.  She is followed through psychiatry for posttraumatic stress disorder.    REVIEW OF SYSTEMS: Out of a complete 14 system review of symptoms, the patient complains only of the following symptoms, and all other reviewed systems are negative.  Headache  ALLERGIES: Allergies  Allergen Reactions   Shellfish Allergy Itching and Other (See Comments)    HIVES IF GOES OVERBOARD, HAS HAD IV DYE AND HAD NO PROBLEMS   Bactrim [Sulfamethoxazole-Trimethoprim] Hives and Other (See Comments)   Chocolate Flavor Itching and Other (See Comments)    PLAIN CHOCOLATE IN ABUNDANCE    HOME MEDICATIONS: Outpatient Medications Prior to Visit  Medication Sig Dispense Refill   amLODipine (NORVASC) 10 MG tablet TAKE 1 TABLET BY MOUTH DAILY 30 tablet 5   folic acid (FOLVITE) 1 MG tablet Take 1 mg by mouth daily.     gabapentin (NEURONTIN) 600 MG tablet Take 1 tablet (600 mg total) by mouth 2 (two) times daily. 180 tablet 3   golimumab (SIMPONI ARIA) 50 MG/4ML SOLN injection Inject into the vein.  LORazepam (ATIVAN) 0.5 MG tablet 0.25-0.5 mg daily as needed for anxiety 30 tablet 1   meclizine (ANTIVERT) 25 MG tablet Take 1 tablet (25 mg total) by mouth 2 (two) times daily as needed for dizziness. 30 tablet 0   methotrexate (50 MG/ML) 1 G injection Inject 40 mg into the vein once a week.      Multiple Vitamin (MULTIVITAMIN) tablet Take 1 tablet by mouth daily.     ondansetron (ZOFRAN) 4 MG tablet Take 1 tablet (4 mg total) by  mouth every 8 (eight) hours as needed for nausea or vomiting. 30 tablet 2   potassium chloride SA (K-DUR) 20 MEQ tablet TAKE 2 TABLETS BY MOUTH FOR 7 DAYS THEN 1 TABLET DAILY 30 tablet 0   valACYclovir (VALTREX) 1000 MG tablet Take 1 tablet (1,000 mg total) by mouth 2 (two) times daily as needed. X 5 days during an outbreak 30 tablet 2   venlafaxine XR (EFFEXOR-XR) 150 MG 24 hr capsule Take total of 225 mg daily (150 mg + 75 mg daily) 90 capsule 0   venlafaxine XR (EFFEXOR-XR) 75 MG 24 hr capsule 225 mg daily (150 mg + 75 mg daily) 90 capsule 0   albuterol (VENTOLIN HFA) 108 (90 Base) MCG/ACT inhaler Inhale 1-2 puffs into the lungs every 6 (six) hours as needed for wheezing or shortness of breath. 8 g 0   carbamazepine (TEGRETOL) 200 MG tablet Take 1 tablet (200 mg total) by mouth 2 (two) times daily. 60 tablet 2   ibuprofen (ADVIL) 600 MG tablet Take 1 tablet (600 mg total) by mouth every 6 (six) hours as needed. 30 tablet 0   Peppermint Oil (IBGARD PO) Take by mouth.     Probiotic Product (RESTORA PO) Take by mouth.     No facility-administered medications prior to visit.    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 and completed radiation 09-30-2012 and completed anti-estrogen therapy   Depression    Genital herpes    Hematuria    History of hyperthyroidism 02/2001   s/p  RAI 03-2001   History of panic attacks    History of radiation therapy    completed 09-23-2012  50Gy in 25 fractions and boost completed [phone]   History of thyroid nodule    Hypertension    Renal calculus, right    Rheumatoid arthritis ([place])    Trigeminal neuralgia of right side of face 04/15/2019   V2   Urgency of urination     PAST SURGICAL HISTORY: Past Surgical History:  Procedure Laterality Date   ABDOMINAL HYSTERECTOMY       ANTERIOR CERVICAL DECOMP/DISCECTOMY FUSION  05-18-2015   [facility]   BREAST LUMPECTOMY Left 07-03-2012    [facility]   CYSTOSCOPY WITH RETROGRADE PYELOGRAM, URETEROSCOPY AND STENT PLACEMENT Right 09/09/2017   Procedure: CYSTOSCOPY WITH RIGHT RETROGRADE PYELOGRAM, URETEROSCOPY , STONE BASKETRY AND STENT PLACEMENT;  Surgeon: Lucas Mallow, MD;  Location: [place];  Service: Urology;  Laterality: Right;   IR TRANSCATHETER BX  02/02/2020   IR US GUIDE VASC ACCESS RIGHT  02/02/2020   IR VENOGRAM HEPATIC W HEMODYNAMIC EVALUATION  02/02/2020   KNEE ARTHROSCOPY Left 2009   TOTAL ABDOMINAL HYSTERECTOMY W/ BILATERAL SALPINGOOPHORECTOMY  2014    FAMILY HISTORY: Family History  Problem Relation Age of Onset  Hypertension Mother    COPD Mother    Depression Mother    Hypertension Father    Kidney disease Father    Alcohol abuse Father    Hypertension Brother    Alzheimer's disease Paternal Grandmother     SOCIAL HISTORY: Social History   Socioeconomic History   Marital status: Single    Spouse name: Not on file   Number of children: Not on file   Years of education: Not on file   Highest education level: Not on file  Occupational History   Not on file  Tobacco Use   Smoking status: Never Smoker   Smokeless tobacco: Never Used  Vaping Use   Vaping Use: Never used  Substance and Sexual Activity   Alcohol use: No   Drug use: No   Sexual activity: Not Currently    Birth control/protection: Surgical  Other Topics Concern   Not on file  Social History Narrative   Not on file   Social Determinants of Health   Financial Resource Strain:    Difficulty of Paying Living Expenses:   Food Insecurity:    Worried About Charity fundraiser in the Last Year:    Arboriculturist in the Last Year:   Transportation Needs:    Film/video editor (Medical):    Lack of Transportation (Non-Medical):   Physical Activity:    Days of Exercise per  Week:    Minutes of Exercise per Session:   Stress:    Feeling of Stress :   Social Connections:    Frequency of Communication with Friends and Family:    Frequency of Social Gatherings with Friends and Family:    Attends Religious Services:    Active Member of Clubs or Organizations:    Attends Archivist Meetings:    Marital Status:   Intimate Partner Violence:    Fear of Current or Ex-Partner:    Emotionally Abused:    Physically Abused:    Sexually Abused:    PHYSICAL EXAM  Vitals:   05/31/20 0834 05/31/20 0839  BP: (!) 144/98 (!) 150/104  Pulse: 85 85  Weight: (!) 213 lb (96.6 kg)   Height: 5\' 3"  (1.6 m)    Body mass index is 37.73 kg/m.  Generalized: Well developed, in no acute distress   Neurological examination  Mentation: Alert oriented to time, place, history taking. Follows all commands speech and language fluent Cranial nerve II-XII: Pupils were equal round reactive to light. Extraocular movements were full, visual field were full on confrontational test. Facial sensation and strength were normal.  Head turning and shoulder shrug  were normal and symmetric. Motor: The motor testing reveals 5 over 5 strength of all 4 extremities. Good symmetric motor tone is noted throughout.  Sensory: Sensory testing is intact to soft touch on all 4 extremities. No evidence of extinction is noted.  Coordination: Cerebellar testing reveals good finger-nose-finger and heel-to-shin bilaterally.  Gait and station: Gait is normal. Tandem gait is normal. Romberg is negative. No drift is seen.  Reflexes: Deep tendon reflexes are symmetric and normal bilaterally.   DIAGNOSTIC DATA (LABS, IMAGING, TESTING) - I reviewed patient records, labs, notes, testing and imaging myself where available.  Lab Results  Component Value Date   WBC 6.9 02/02/2020   HGB 13.3 02/02/2020   HCT 41.9 02/02/2020   MCV 86.9 02/02/2020   PLT 253 02/02/2020      Component Value  Date/Time   NA 143 02/02/2020 1026  NA 145 (H) 11/20/2018 1240   K 3.0 (L) 02/02/2020 1026   CL 105 02/02/2020 1026   CO2 27 02/02/2020 1026   GLUCOSE 96 02/02/2020 1026   BUN 10 02/02/2020 1026   BUN 13 11/20/2018 1240   CREATININE 0.61 02/02/2020 1026   CREATININE 0.56 01/16/2017 0927   CALCIUM 8.8 (L) 02/02/2020 1026   PROT 7.9 02/02/2020 1026   PROT 7.4 11/20/2018 1240   ALBUMIN 4.5 02/02/2020 1026   ALBUMIN 4.5 11/20/2018 1240   AST 22 02/02/2020 1026   ALT 23 02/02/2020 1026   ALKPHOS 83 02/02/2020 1026   BILITOT 0.5 02/02/2020 1026   BILITOT <0.2 11/20/2018 1240   GFRNONAA >60 02/02/2020 1026   GFRAA >60 02/02/2020 1026   Lab Results  Component Value Date   CHOL 226 (H) 11/20/2018   HDL 75 11/20/2018   LDLCALC 131 (H) 11/20/2018   TRIG 101 11/20/2018   CHOLHDL 3.0 11/20/2018   No results found for: HGBA1C Lab Results  Component Value Date   VITAMINB12 409 09/18/2018   Lab Results  Component Value Date   TSH 0.943 11/20/2018      ASSESSMENT AND PLAN 44 y.o. year old female  has a past medical history of Anxiety, Breast cancer ([place]) (onologist-- dr sorscher w/ [facility]), Depression, Genital herpes, Hematuria, History of hyperthyroidism (02/2001), History of panic attacks, History of radiation therapy, History of thyroid nodule, Hypertension, Renal calculus, right, Rheumatoid arthritis ([place]), Trigeminal neuralgia of right side of face (04/15/2019), and Urgency of urination. here with:  1.  Episode of left arm weakness, intermittently, episode of confusion, blurred vision, left sided headache -Rule out stroke, will order MRI of the brain without contrast -Does have HTN, BP elevated, did not take medications today, on amlodipine, PCP planning to add HCTZ. Follow-up with PCP about this -May consider additional stroke work-up if stroke is seen on MRI -If symptoms return, she is to go to the emergency room immediately, today neuro exam is reassuring, only symptoms dull  left sided headache -Return in 4 months or sooner if needed  2.  Right facial pain neuralgia, V2 distribution -Overall well controlled on carbamazepine and gabapentin -Check routine blood work today, along with carbamazepine level -Continue gabapentin 600 mg twice a day -Continue carbamazepine 200 mg twice a day  I spent 30 minutes of face-to-face and non-face-to-face time with patient.  This included previsit chart review, lab review, study review, order entry, electronic health record documentation, patient education.  Butler Denmark, AGNP-C, DNP 05/31/2020, 9:32 AM [facility] [address], [place], [place] [address] [phone]

## 2020-05-31 NOTE — Patient Instructions (Signed)
Check blood work today  Check MRI of the brain  Continue current medications  If symptoms of stroke return, go to the ER See you back in 4 months

## 2020-06-01 ENCOUNTER — Encounter: Payer: Self-pay | Admitting: Neurology

## 2020-06-01 ENCOUNTER — Other Ambulatory Visit: Payer: Self-pay | Admitting: Neurology

## 2020-06-01 ENCOUNTER — Telehealth: Payer: Self-pay

## 2020-06-01 LAB — CBC WITH DIFFERENTIAL/PLATELET
Basophils Absolute: 0.1 10*3/uL (ref 0.0–0.2)
Basos: 1 %
EOS (ABSOLUTE): 0.2 10*3/uL (ref 0.0–0.4)
Eos: 4 %
Hematocrit: 39.7 % (ref 34.0–46.6)
Hemoglobin: 12.7 g/dL (ref 11.1–15.9)
Immature Grans (Abs): 0 10*3/uL (ref 0.0–0.1)
Immature Granulocytes: 0 %
Lymphocytes Absolute: 2.3 10*3/uL (ref 0.7–3.1)
Lymphs: 34 %
MCH: 27.4 pg (ref 26.6–33.0)
MCHC: 32 g/dL (ref 31.5–35.7)
MCV: 86 fL (ref 79–97)
Monocytes Absolute: 0.5 10*3/uL (ref 0.1–0.9)
Monocytes: 7 %
Neutrophils Absolute: 3.6 10*3/uL (ref 1.4–7.0)
Neutrophils: 54 %
Platelets: 224 10*3/uL (ref 150–450)
RBC: 4.63 x10E6/uL (ref 3.77–5.28)
RDW: 14 % (ref 11.7–15.4)
WBC: 6.6 10*3/uL (ref 3.4–10.8)

## 2020-06-01 LAB — CARBAMAZEPINE LEVEL, TOTAL: Carbamazepine (Tegretol), S: 7 ug/mL (ref 4.0–12.0)

## 2020-06-01 LAB — COMPREHENSIVE METABOLIC PANEL
ALT: 27 IU/L (ref 0–32)
AST: 18 IU/L (ref 0–40)
Albumin/Globulin Ratio: 1.7 (ref 1.2–2.2)
Albumin: 4.3 g/dL (ref 3.8–4.8)
Alkaline Phosphatase: 93 IU/L (ref 48–121)
BUN/Creatinine Ratio: 23 (ref 9–23)
BUN: 15 mg/dL (ref 6–24)
Bilirubin Total: <0.2 mg/dL (ref 0.0–1.2)
CO2: 27 mmol/L (ref 20–29)
Calcium: 9 mg/dL (ref 8.7–10.2)
Chloride: 105 mmol/L (ref 96–106)
Creatinine, Ser: 0.65 mg/dL (ref 0.57–1.00)
GFR calc Af Amer: 124 mL/min/{1.73_m2} (ref >59)
GFR calc non Af Amer: 108 mL/min/{1.73_m2} (ref >59)
Globulin, Total: 2.5 g/dL (ref 1.5–4.5)
Glucose: 100 mg/dL — ABNORMAL HIGH (ref 65–99)
Potassium: 3.5 mmol/L (ref 3.5–5.2)
Sodium: 144 mmol/L (ref 134–144)
Total Protein: 6.8 g/dL (ref 6.0–8.5)

## 2020-06-01 NOTE — Progress Notes (Signed)
I have read the note, and I agree with the clinical assessment and plan.  Ranya Fiddler K Lynzee Lindquist   

## 2020-06-01 NOTE — Telephone Encounter (Signed)
Attempted to call the patient without success. ° °LM on the VM for the patient to call back re: recent results. ° °**If the patient calls back please relay the message below from Sarah Slack NP °

## 2020-06-01 NOTE — Telephone Encounter (Signed)
Pt called back and was made aware of results.  No call back requested

## 2020-06-01 NOTE — Telephone Encounter (Signed)
-----   Message from Suzzanne Cloud, NP sent at 06/01/2020  7:44 AM EDT ----- Labs show no significant abnormality.

## 2020-06-02 NOTE — Telephone Encounter (Signed)
Per patient request to faxed mri order to [facility]. I just faxed the order and send a mychart message for the patient to be aware . Fax # [phone].

## 2020-06-06 NOTE — Telephone Encounter (Signed)
Scheduled at [facility] for 06/13/20 at 8:00 Am.

## 2020-06-13 ENCOUNTER — Encounter: Payer: Self-pay | Admitting: Neurology

## 2020-06-13 DIAGNOSIS — R519 Headache, unspecified: Secondary | ICD-10-CM | POA: Insufficient documentation

## 2020-06-15 ENCOUNTER — Telehealth: Payer: Self-pay | Admitting: *Deleted

## 2020-06-15 NOTE — Telephone Encounter (Signed)
MRI brain wo contrast report received from [facility].  To your inbox.

## 2020-06-16 NOTE — Telephone Encounter (Signed)
Previous MRI in 2019 was unremarkable, no white matter changes were noted. This sounds like a clear change, will need to review the disc, potentially do a bit further work-up.

## 2020-06-16 NOTE — Telephone Encounter (Signed)
I called the patient.  Received MRI of the brain report, done at [facility], where she works:  1.  Approximately 12 small subcortical white matter FLAIR/T2 hyperintensities within the cerebral hemispheres, nonspecific, with differential considerations including migraine vasculopathy, chronic ischemia and demyelination.  2.  Adenoidal hypertrophy  She will drop the disc off for image review, as report mentions multiple white matter hyperintensities.   Patient does report history of migraine headaches, continues to have some dull left-sided headache.  No more episodes of left arm weakness, confusion.  She does say years ago, she had a complicated migraine where her speech was affected.  Will ask Dr. Jannifer Franklin to review images at his convenience next week, mat guide whether further work-up is needed for recent episode, or pursue treatment headache, as possible migraine process?

## 2020-06-19 ENCOUNTER — Other Ambulatory Visit: Payer: No Typology Code available for payment source

## 2020-06-20 ENCOUNTER — Telehealth: Payer: Self-pay | Admitting: Neurology

## 2020-06-20 NOTE — Telephone Encounter (Signed)
MRI of the brain ordered by Butler Denmark has been done, shows some small subcortical white matter lesions that are nonspecific.  Apparently the disc is to be sent over for my review, I will review this before calling the patient.  The patient apparently had been complaining of left arm weakness, confusion and left-sided headache.  This certainly could be migrainous in nature.   MRI brain 06/13/20:  Impression:  1.  Approximately 12 small subcortical white matter FLAIR/T2 hyperintensities with the cerebral hemispheres, nonspecific, with differential considerations including migraine vasculopathy, chronic ischemia and demyelination.  2.  Adenoidal hypertrophy.

## 2020-06-21 NOTE — Telephone Encounter (Signed)
I called the patient, I have the written report of the MRI that indicates nonspecific small white matter lesions, could be consistent with migraine events.  I am waiting on the disc to visualize the actual study. I will try to call her back when I get this.

## 2020-07-15 NOTE — Telephone Encounter (Signed)
Pt has called stating 3 weeks ago she droped off the disc of her MRI of the brain and has not heard anything. Please call once the disc has been received and reviewed.

## 2020-07-18 NOTE — Telephone Encounter (Signed)
I made a second request to [facility] requesting pt cd and a return phone call.

## 2020-07-18 NOTE — Telephone Encounter (Signed)
I tried to call patient, unable to get through.  The disc delivered to our office was not working properly, initially I thought this was because the computer did not have the proper drivers, but I had another disc from [facility] that worked just fine.  The disc delivered by the patient was therefore defective.

## 2020-07-19 ENCOUNTER — Telehealth: Payer: Self-pay | Admitting: Neurology

## 2020-07-19 NOTE — Telephone Encounter (Signed)
I finally had a chance to review the disc of the MRI of the brain, this shows punctate white matter changes that are nonspecific, the patient does have a history of hypertension and these spots are likely related to this.  The MRI is not typical of multiple sclerosis or other demyelinating diseases.

## 2020-07-20 ENCOUNTER — Emergency Department (HOSPITAL_COMMUNITY): Payer: Self-pay

## 2020-07-20 ENCOUNTER — Emergency Department (HOSPITAL_COMMUNITY)
Admission: EM | Admit: 2020-07-20 | Discharge: 2020-07-20 | Disposition: A | Discharge status: Home / Self Care | Payer: Self-pay | Attending: Emergency Medicine | Admitting: Emergency Medicine

## 2020-07-20 ENCOUNTER — Other Ambulatory Visit: Payer: Self-pay

## 2020-07-20 DIAGNOSIS — M25561 Pain in right knee: Secondary | ICD-10-CM | POA: Insufficient documentation

## 2020-07-20 DIAGNOSIS — I10 Essential (primary) hypertension: Secondary | ICD-10-CM | POA: Insufficient documentation

## 2020-07-20 DIAGNOSIS — Z79899 Other long term (current) drug therapy: Secondary | ICD-10-CM | POA: Insufficient documentation

## 2020-07-20 DIAGNOSIS — Z853 Personal history of malignant neoplasm of breast: Secondary | ICD-10-CM | POA: Insufficient documentation

## 2020-07-20 MED ORDER — HYDROCODONE-ACETAMINOPHEN 5-325 MG PO TABS
1.0000 | ORAL_TABLET | Freq: Once | ORAL | Status: AC
  Administered 2020-07-20: 1 via ORAL
  Filled 2020-07-20: qty 1

## 2020-07-20 NOTE — ED Provider Notes (Signed)
[place] EMERGENCY DEPARTMENT Provider Note   CSN: [id number] Arrival date & time: 07/20/20  1811     History Chief Complaint  Patient presents with  . Leg Pain     Joanne Garrett is a very pleasant 44 y.o. female presenting with concern for 3 days of progressively worsening aching right knee pain rated 7/10 at this time. This pain has begun to impact her gait - she reports that she has a mild limp when she walks now - and there pain is not alleviated by tylenol, meloxicam, and gabapentin at home. Additionally she reports intermittent burning pain in her right buttock that occasionally shoots down the back of her right leg; this started this morning.  She reports she recently began working a new job at the [employer] where she lifts heavy boxes a majority of the day; she feels that her lifting technique is poor, lifting mostly with her back and not her legs.   She denies numbness or tingling in her genital area, denies difficulty emptying her bladder, or either fecal or urinary incontinence. She denies weakness in either leg, and she has never injured either of her legs prior to this.   Joanne Garrett has a history of Rheumatoid Arthritis, managed on a biologic. Her symptoms are primarily in her hands, but occasionally affect her hips and her knees. She reports this pain is different and more severe than her RA pain. She denies fever, chills, myalgias, or injury or recent trauma to the knee.   She is a non smoker, denies recreational drug use.     HPI     Past Medical History:  Diagnosis Date  . Anxiety   . Breast cancer 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 Depression   . Genital herpes   . Hematuria   . History of hyperthyroidism 02/2001   s/p  RAI 03-2001  . History of panic attacks   . History of radiation  therapy    completed 09-23-2012  50Gy in 25 fractions and boost completed [phone]  . History of thyroid nodule   . Hypertension   . Renal calculus, right   . Rheumatoid arthritis ([place])   . Trigeminal neuralgia of right side of face 04/15/2019   V2  . Urgency of urination     Patient Active Problem List   Diagnosis Date Noted  . Weakness 05/31/2020  . Snoring 05/20/2019  . Insomnia 05/20/2019  . Trigeminal neuralgia of right side of face 04/15/2019  . 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 Obesity (BMI 30.0-34.9) 01/31/2018  . 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  . Rheumatoid arthritis ([place])   . PTSD (post-traumatic stress disorder) 09/17/2017  . MDD (major depressive disorder), recurrent episode, moderate ([place]) 09/17/2017  . HTN (hypertension) 01/16/2017  . Genital herpes 01/16/2017  . History of thyroid disorder 01/16/2017  . Left thyroid nodule 01/16/2017  . Breast pain, left 01/16/2017  . History of breast cancer 01/16/2017    Past Surgical History:  Procedure Laterality Date  . ABDOMINAL HYSTERECTOMY    . ANTERIOR CERVICAL DECOMP/DISCECTOMY FUSION  05-18-2015   [facility]  . BREAST LUMPECTOMY Left 07-03-2012    [facility]  . CYSTOSCOPY WITH RETROGRADE PYELOGRAM, URETEROSCOPY AND STENT PLACEMENT Right 09/09/2017   Procedure: CYSTOSCOPY WITH RIGHT RETROGRADE PYELOGRAM, URETEROSCOPY , STONE BASKETRY AND STENT  PLACEMENT;  Surgeon: Lucas Mallow, MD;  Location: [facility];  Service: Urology;  Laterality: Right;  . IR TRANSCATHETER BX  02/02/2020  . IR US GUIDE VASC ACCESS RIGHT  02/02/2020  . IR VENOGRAM HEPATIC W HEMODYNAMIC EVALUATION  02/02/2020  . KNEE ARTHROSCOPY Left 2009  . TOTAL ABDOMINAL HYSTERECTOMY W/ BILATERAL SALPINGOOPHORECTOMY  2014     OB History   No obstetric history on file.     Family History  Problem Relation Age of Onset  . Hypertension Mother   . COPD Mother   . Depression  Mother   . Hypertension Father   . Kidney disease Father   . Alcohol abuse Father   . Hypertension Brother   . Alzheimer's disease Paternal Grandmother     Social History   Tobacco Use  . Smoking status: Never Smoker  . Smokeless tobacco: Never Used  Vaping Use  . Vaping Use: Never used  Substance Use Topics  . Alcohol use: No  . Drug use: No    Home Medications Prior to Admission medications   Medication Sig Start Date End Date Taking? Authorizing Provider  amLODipine (NORVASC) 10 MG tablet TAKE 1 TABLET BY MOUTH DAILY 01/02/19   Henson, Vickie L, NP-C  carbamazepine (TEGRETOL) 200 MG tablet Take 1 tablet (200 mg total) by mouth 2 (two) times daily. 05/31/20   Suzzanne Cloud, NP  folic acid (FOLVITE) 1 MG tablet Take 1 mg by mouth daily. 10/20/18   [provider]  gabapentin (NEURONTIN) 600 MG tablet Take 1 tablet (600 mg total) by mouth 2 (two) times daily. 04/15/19   Kathrynn Ducking, MD  golimumab ([place] ARIA) 50 MG/4ML SOLN injection Inject into the vein. 03/03/19   [provider]  LORazepam (ATIVAN) 0.5 MG tablet 0.25-0.5 mg daily as needed for anxiety 12/07/19   [place] Crane, MD  meclizine (ANTIVERT) 25 MG tablet Take 1 tablet (25 mg total) by mouth 2 (two) times daily as needed for dizziness. 08/25/18   Henson, Vickie L, NP-C  methotrexate (50 MG/ML) 1 G injection Inject 40 mg into the vein once a week.     [provider]  Multiple Vitamin (MULTIVITAMIN) tablet Take 1 tablet by mouth daily.    [provider]  ondansetron (ZOFRAN) 4 MG tablet Take 1 tablet (4 mg total) by mouth every 8 (eight) hours as needed for nausea or vomiting. 10/17/18   Kathrynn Ducking, MD  potassium chloride SA (K-DUR) 20 MEQ tablet TAKE 2 TABLETS BY MOUTH FOR 7 DAYS THEN 1 TABLET DAILY 03/09/19   Henson, Vickie L, NP-C  valACYclovir (VALTREX) 1000 MG tablet Take 1 tablet (1,000 mg total) by mouth 2 (two) times daily as needed. X 5 days during an outbreak 08/15/18    [place] Dingwall L, NP-C  venlafaxine XR (EFFEXOR-XR) 150 MG 24 hr capsule Take total of 225 mg daily (150 mg + 75 mg daily) 05/26/20   Norman Clay, MD  venlafaxine XR (EFFEXOR-XR) 75 MG 24 hr capsule 225 mg daily (150 mg + 75 mg daily) 05/26/20   Norman Clay, MD    Allergies    Shellfish allergy, Bactrim [sulfamethoxazole-trimethoprim], and Chocolate flavor  Review of Systems   Review of Systems  Constitutional: Negative for activity change, chills, fatigue and fever.  Respiratory: Negative for shortness of breath.   Cardiovascular: Negative for chest pain and palpitations.  Gastrointestinal: Negative for nausea and vomiting.  Musculoskeletal: Positive for arthralgias, gait problem and joint swelling.  Negative for back pain and myalgias.  Skin: Negative for wound.  Neurological: Negative for light-headedness and numbness.    Physical Exam Updated Vital Signs BP (!) 146/94 (BP Location: Right Arm)   Pulse 90   Temp 98.2 F (36.8 C) (Oral)   Resp 20   LMP 06/11/2013   SpO2 98%   Physical Exam Vitals and nursing note reviewed.  Constitutional:      General: She is not in acute distress. HENT:     Head: Normocephalic and atraumatic.  Eyes:     General: No scleral icterus.       Right eye: No discharge.        Left eye: No discharge.     Conjunctiva/sclera: Conjunctivae normal.  Pulmonary:     Effort: Pulmonary effort is normal.  Musculoskeletal:        General: Swelling and tenderness present.     Right knee: Swelling and effusion present. Tenderness present over the medial joint line and lateral joint line.     Left knee: No swelling, deformity or effusion. No tenderness.     Comments: Mild edema of the right knee over the prepatellar space. TTP of medial, lateral, and anterior right knee. Unable to assess ligaments or meniscus of the right knee due to patient discomfort.   Skin:    General: Skin is warm and dry.     Comments: No sign of break in skin or trauma over  the distal right thigh, right knee, or Right lower leg.   Neurological:     General: No focal deficit present.     Mental Status: She is alert.     Sensory: Sensation is intact.     Motor: Motor function is intact. No weakness.     Gait: Gait normal.     Comments: Walks with mild limp, favoring right leg.   Psychiatric:        Mood and Affect: Mood normal.     ED Results / Procedures / Treatments   Labs (all labs ordered are listed, but only abnormal results are displayed) Labs Reviewed - No data to display  EKG None  Radiology DG Knee Complete 4 Views Right  Result Date: 07/20/2020 CLINICAL DATA:  Right knee pain EXAM: RIGHT KNEE - COMPLETE 4+ VIEW COMPARISON:  X-ray right knee 04/03/2014. FINDINGS: No evidence of fracture or dislocation. Suggestion of possible trace joint effusion. No evidence of significant arthropathy or other focal bone abnormality. Soft tissues are unremarkable. IMPRESSION: 1. No acute fracture or dislocation of the right knee. 2. Possible trace joint effusion. Electronically Signed   By: Iven Finn M.D.   On: 07/20/2020 20:33    Procedures Procedures (including critical care time)  Medications Ordered in ED Medications  HYDROcodone-acetaminophen (NORCO/VICODIN) 5-325 MG per tablet 1 tablet (1 tablet Oral Given 07/20/20 2101)    ED Course  I have reviewed the triage vital signs and the nursing notes.  Pertinent labs & imaging results that were available during my care of the patient were reviewed by me and considered in my medical decision making (see chart for details).    MDM Rules/Calculators/A&P                         Joanne Garrett is a 44 y/o woman who presented with three days of worsening right knee pain.    She denies fever, chills, nausea, redness or warmth of the joint.   Denies prior injury to the knee,  however started new physically demanding job.   Vital signs unconcerning on intake - afebrile, mildly hypertensive (normal for her),  normal HR of 90, O2 sat 98% on RA. Not in acute distress.   Norco 5-325 single does administered in the ED for 7/10 pain.   Physical exam revealed mild edema of the right knee suggestive of effusion, as well as tenderness in the medial and lateral joint lines, as well as the prepatellar space. Unable to assess ligements / meniscus due to patient discomfort.   X-ray of the right knee revealed no osseous abnormality, possible trace effusion of the joint  Differential diagnosis for this patient includes septic joint, overuse injury, ligament or mensicus injury of the right knee, or fracture.   Due to normal vital signs and lack of infections signs on physical exam, septic joint unlikely. Per patient history no clear mechanism for ligamentous injury. XR of the right knee ruled out fracture. I favor diagnosis of right knee overuse injury secondary to sudden increase of physical activity at the patient's new job.   Due to normal vitals signs and unconcerning physical exam, I feel there is no indication for further work up in the emergency department at this time.   The patient should follow up with orthopedics within the next 1 to 2 weeks for follow up. We will provide contact information for orthopedic office.   She may continue tylenol and topical analegsics such as BenGay or BioFreeze for her knee pain. Per patient report, her PCP advised her to avoid NSAIDs due to declining kidney function. I encouraged her to contact her PCP for instruction regarding use of NSAIDs in this situation.  She should also use a compression sleeve / ACE wrap for added support of her right knee for the next week, or until symptom resolution.  In addition, she may utilize OTC lidocaine / lidocaine+ methyl salicylate patches for her low back pain. She should elevate and ice the joint for 15-20 minutes at a time a few times daily for the next 3-5 days, and as needed afterward.   Joanne Garrett was provided with a work note  requesting she be diverted to light duty work for the next week.   She should return to the ED immediately should she develop fever, chills, heat or redness of the joint, or any other severe or worsening symptoms.   Joanne Garrett voiced understanding of the plan, and each of her questions was answered to her expressed satisfaction.   Final Clinical Impression(s) / ED Diagnoses Final diagnoses:  Acute pain of right knee    Rx / DC Orders ED Discharge Orders    None       Aura Dials 07/20/20 2307    Carmin Muskrat, MD 07/20/20 2328

## 2020-07-20 NOTE — ED Notes (Signed)
Discharge instructions discussed with pt. Pt verbalized understanding. Pt stable and ambulatory. No signature pad available. 

## 2020-07-20 NOTE — Discharge Instructions (Signed)
You may continue tylenol for pain management at home, and may additionally try lidocaine / lidocaine-methyl salicylate patches. Wear the compression sleeve or ace-wrap during activity.   Please call the orthopedist office for an appointment for follow up. Please return to the ED should your symptoms worsen or you develop new or severe symptoms.

## 2020-07-20 NOTE — ED Triage Notes (Signed)
Pt reports burning pain in right buttocks that shoots down her entire leg since driving home from work this morning. Recently started new job at [employer] where she is lifting boxes.
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
## 2020-08-13 ENCOUNTER — Other Ambulatory Visit: Payer: Self-pay

## 2020-08-13 ENCOUNTER — Emergency Department (HOSPITAL_COMMUNITY)
Admission: EM | Admit: 2020-08-13 | Discharge: 2020-08-14 | Disposition: A | Discharge status: Home / Self Care | Payer: Self-pay | Attending: Emergency Medicine | Admitting: Emergency Medicine

## 2020-08-13 ENCOUNTER — Encounter (HOSPITAL_COMMUNITY): Payer: Self-pay | Admitting: *Deleted

## 2020-08-13 DIAGNOSIS — Z5321 Procedure and treatment not carried out due to patient leaving prior to being seen by health care provider: Secondary | ICD-10-CM | POA: Insufficient documentation

## 2020-08-13 DIAGNOSIS — R109 Unspecified abdominal pain: Secondary | ICD-10-CM | POA: Insufficient documentation

## 2020-08-13 DIAGNOSIS — R6883 Chills (without fever): Secondary | ICD-10-CM | POA: Insufficient documentation

## 2020-08-13 LAB — COMPREHENSIVE METABOLIC PANEL
ALT: 54 U/L — ABNORMAL HIGH (ref 0–44)
AST: 27 U/L (ref 15–41)
Albumin: 3.2 g/dL — ABNORMAL LOW (ref 3.5–5.0)
Alkaline Phosphatase: 96 U/L (ref 38–126)
Anion gap: 13 (ref 5–15)
BUN: 12 mg/dL (ref 6–20)
CO2: 26 mmol/L (ref 22–32)
Calcium: 8.9 mg/dL (ref 8.9–10.3)
Chloride: 100 mmol/L (ref 98–111)
Creatinine, Ser: 0.91 mg/dL (ref 0.44–1.00)
GFR, Estimated: >60 mL/min (ref >60)
Glucose, Bld: 95 mg/dL (ref 70–99)
Potassium: 3.4 mmol/L — ABNORMAL LOW (ref 3.5–5.1)
Sodium: 139 mmol/L (ref 135–145)
Total Bilirubin: 0.5 mg/dL (ref 0.3–1.2)
Total Protein: 7.2 g/dL (ref 6.5–8.1)

## 2020-08-13 LAB — CBC
HCT: 32.6 % — ABNORMAL LOW (ref 36.0–46.0)
Hemoglobin: 10.2 g/dL — ABNORMAL LOW (ref 12.0–15.0)
MCH: 27.3 pg (ref 26.0–34.0)
MCHC: 31.3 g/dL (ref 30.0–36.0)
MCV: 87.2 fL (ref 80.0–100.0)
Platelets: 401 10*3/uL — ABNORMAL HIGH (ref 150–400)
RBC: 3.74 MIL/uL — ABNORMAL LOW (ref 3.87–5.11)
RDW: 13.7 % (ref 11.5–15.5)
WBC: 12.5 10*3/uL — ABNORMAL HIGH (ref 4.0–10.5)
nRBC: 0 % (ref 0.0–0.2)

## 2020-08-13 LAB — LIPASE, BLOOD: Lipase: 24 U/L (ref 11–51)

## 2020-08-13 LAB — I-STAT BETA HCG BLOOD, ED (MC, WL, AP ONLY): I-stat hCG, quantitative: <5 m[IU]/mL (ref <5)

## 2020-08-13 NOTE — ED Triage Notes (Signed)
Pt states she has had abd pain and chills since last Sat. Abd worse yesterday and today.

## 2020-08-15 ENCOUNTER — Emergency Department (HOSPITAL_COMMUNITY)
Admission: EM | Admit: 2020-08-15 | Discharge: 2020-08-15 | Disposition: A | Discharge status: Home / Self Care | Payer: Self-pay | Attending: Emergency Medicine | Admitting: Emergency Medicine

## 2020-08-15 ENCOUNTER — Other Ambulatory Visit: Payer: Self-pay

## 2020-08-15 ENCOUNTER — Emergency Department (HOSPITAL_COMMUNITY): Payer: Self-pay

## 2020-08-15 ENCOUNTER — Encounter (HOSPITAL_COMMUNITY): Payer: Self-pay

## 2020-08-15 DIAGNOSIS — E039 Hypothyroidism, unspecified: Secondary | ICD-10-CM | POA: Insufficient documentation

## 2020-08-15 DIAGNOSIS — Z853 Personal history of malignant neoplasm of breast: Secondary | ICD-10-CM | POA: Insufficient documentation

## 2020-08-15 DIAGNOSIS — I10 Essential (primary) hypertension: Secondary | ICD-10-CM | POA: Insufficient documentation

## 2020-08-15 DIAGNOSIS — Z79899 Other long term (current) drug therapy: Secondary | ICD-10-CM | POA: Insufficient documentation

## 2020-08-15 DIAGNOSIS — N12 Tubulo-interstitial nephritis, not specified as acute or chronic: Secondary | ICD-10-CM | POA: Insufficient documentation

## 2020-08-15 LAB — URINALYSIS, ROUTINE W REFLEX MICROSCOPIC
Bilirubin Urine: NEGATIVE
Glucose, UA: NEGATIVE mg/dL
Ketones, ur: NEGATIVE mg/dL
Nitrite: NEGATIVE
Protein, ur: NEGATIVE mg/dL
RBC / HPF: >50 RBC/hpf — ABNORMAL HIGH (ref 0–5)
Specific Gravity, Urine: 1.005 (ref 1.005–1.030)
pH: 8 (ref 5.0–8.0)

## 2020-08-15 LAB — CBC WITH DIFFERENTIAL/PLATELET
Abs Immature Granulocytes: 0.04 10*3/uL (ref 0.00–0.07)
Basophils Absolute: 0 10*3/uL (ref 0.0–0.1)
Basophils Relative: 0 %
Eosinophils Absolute: 0.2 10*3/uL (ref 0.0–0.5)
Eosinophils Relative: 2 %
HCT: 33.5 % — ABNORMAL LOW (ref 36.0–46.0)
Hemoglobin: 10.5 g/dL — ABNORMAL LOW (ref 12.0–15.0)
Immature Granulocytes: 1 %
Lymphocytes Relative: 24 %
Lymphs Abs: 2 10*3/uL (ref 0.7–4.0)
MCH: 27.3 pg (ref 26.0–34.0)
MCHC: 31.3 g/dL (ref 30.0–36.0)
MCV: 87 fL (ref 80.0–100.0)
Monocytes Absolute: 1.3 10*3/uL — ABNORMAL HIGH (ref 0.1–1.0)
Monocytes Relative: 15 %
Neutro Abs: 5 10*3/uL (ref 1.7–7.7)
Neutrophils Relative %: 58 %
Platelets: 423 10*3/uL — ABNORMAL HIGH (ref 150–400)
RBC: 3.85 MIL/uL — ABNORMAL LOW (ref 3.87–5.11)
RDW: 13.6 % (ref 11.5–15.5)
WBC: 8.5 10*3/uL (ref 4.0–10.5)
nRBC: 0 % (ref 0.0–0.2)

## 2020-08-15 LAB — COMPREHENSIVE METABOLIC PANEL
ALT: 48 U/L — ABNORMAL HIGH (ref 0–44)
AST: 22 U/L (ref 15–41)
Albumin: 3.1 g/dL — ABNORMAL LOW (ref 3.5–5.0)
Alkaline Phosphatase: 86 U/L (ref 38–126)
Anion gap: 13 (ref 5–15)
BUN: 8 mg/dL (ref 6–20)
CO2: 29 mmol/L (ref 22–32)
Calcium: 9.1 mg/dL (ref 8.9–10.3)
Chloride: 101 mmol/L (ref 98–111)
Creatinine, Ser: 0.73 mg/dL (ref 0.44–1.00)
GFR, Estimated: >60 mL/min (ref >60)
Glucose, Bld: 85 mg/dL (ref 70–99)
Potassium: 3.5 mmol/L (ref 3.5–5.1)
Sodium: 143 mmol/L (ref 135–145)
Total Bilirubin: 0.6 mg/dL (ref 0.3–1.2)
Total Protein: 7.4 g/dL (ref 6.5–8.1)

## 2020-08-15 LAB — I-STAT BETA HCG BLOOD, ED (MC, WL, AP ONLY): I-stat hCG, quantitative: <5 m[IU]/mL (ref <5)

## 2020-08-15 LAB — LIPASE, BLOOD: Lipase: 23 U/L (ref 11–51)

## 2020-08-15 MED ORDER — SODIUM CHLORIDE 0.9 % IV SOLN: INTRAVENOUS | Status: DC

## 2020-08-15 MED ORDER — IOHEXOL 300 MG/ML  SOLN
100.0000 mL | Freq: Once | INTRAMUSCULAR | Status: AC | PRN
  Administered 2020-08-15: 100 mL via INTRAVENOUS

## 2020-08-15 MED ORDER — ONDANSETRON HCL 4 MG/2ML IJ SOLN
4.0000 mg | Freq: Once | INTRAMUSCULAR | Status: AC
  Administered 2020-08-15: 4 mg via INTRAVENOUS
  Filled 2020-08-15: qty 2

## 2020-08-15 MED ORDER — SODIUM CHLORIDE 0.9 % IV BOLUS
1000.0000 mL | Freq: Once | INTRAVENOUS | Status: AC
  Administered 2020-08-15: 1000 mL via INTRAVENOUS

## 2020-08-15 MED ORDER — CEPHALEXIN 500 MG PO CAPS
500.0000 mg | ORAL_CAPSULE | Freq: Once | ORAL | Status: AC
  Administered 2020-08-15: 500 mg via ORAL
  Filled 2020-08-15: qty 1

## 2020-08-15 MED ORDER — ONDANSETRON 4 MG PO TBDP: 4.0000 mg | ORAL_TABLET | Freq: Three times a day (TID) | ORAL | 0 refills | Status: DC | PRN

## 2020-08-15 MED ORDER — CEPHALEXIN 500 MG PO CAPS: 500.0000 mg | ORAL_CAPSULE | Freq: Four times a day (QID) | ORAL | 0 refills | Status: AC

## 2020-08-15 MED ORDER — SODIUM CHLORIDE (PF) 0.9 % IJ SOLN
INTRAMUSCULAR | Status: AC
  Filled 2020-08-15: qty 50

## 2020-08-15 NOTE — ED Triage Notes (Signed)
Pt c/o generalized abdominal pain since Saturday.   Patient reports she has intermittent episodes of same   C/o nausea and abdominal pain   8/10 pain   A/Ox4 Ambulatory in triage   Patient here Saturday, had blood work, and left due to wait.

## 2020-08-15 NOTE — ED Notes (Signed)
Pt discharged from this ED in stable condition at this time. All discharge instructions and follow up care reviewed with pt with no further questions at this time. Pt ambulatory with steady gait, clear speech.  

## 2020-08-15 NOTE — Discharge Instructions (Signed)
You are seen today for pyelonephritis, this is an infection of your kidney.  You to take the antibiotics as prescribed, you can also take the Zofran when needed if you feel nauseous.  Make sure to stay hydrated, get plenty of rest.  You can take Tylenol as directed on the bottle for pain.  Please use the attached instructions.  Please follow-up with Dr. Elenor Quinones, urology in the next 2 days.  You can schedule an appointment with him.  I also want you to follow-up with primary care for a recheck and your hemoglobin since it was slightly low today. I hope you feel better!

## 2020-08-15 NOTE — ED Provider Notes (Signed)
[facility] [facility] DEPT Provider Note   CSN: [id number] Arrival date & time: 08/15/20  9233     History Chief Complaint  Patient presents with  . Abdominal Pain    Joanne Garrett is a 44 y.o. female with pertinent past medical history of hypertension, nephrolithiasis, breast cancer s/p lumpectomy in 2013 that presents the emergency department today for abdominal pain, more specific to the right lower quadrant since Friday.  Patient states that she came here on Saturday, however left due to the long wait.  Patient states that she has been having nausea, no vomiting.  States that she has not been able to eat normally due to nausea.  No diarrhea, constipation, bright red blood per rectum.  Patient states that she is also felt warm, states that she thinks she had a mild fever the past couple of days.  Patient states that she has had abdominal pain for the past month, however it has been the worst since Saturday.  States that she has lost about 15 pounds with night sweats and ongoing abdominal pain, has not seen anyone for this because she does not have any insurance.  States that she had to come in in the emergency department on Saturday since the pain was the worst.  States that pain right now is mild, states that at 5/10.  Describes the pain as a dull aching sensation, does not radiate anywhere.  No back pain. Does come and go.  No chest pain, shortness of breath, regurgitation, cough, URI symptoms, myalgias.  States that she is generally healthy.  Does have history of hysterectomy, no other abdominal surgeries.  Recent bowel movement this morning. No dysuria or hematuria, no vaginal discharge, pelvic pain, vaginal bleeding.   HPI     Past Medical History:  Diagnosis Date  . Anxiety   . 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 Depression   . Genital herpes   . Hematuria   . History of hyperthyroidism 02/2001   s/p  RAI 03-2001  . History of panic attacks   . History of radiation therapy    completed 09-23-2012  50Gy in 25 fractions and boost completed [id number]  . History of thyroid nodule   . Hypertension   . Renal calculus, right   . Rheumatoid arthritis ([place])   . Trigeminal neuralgia of right side of face 04/15/2019   V2  . Urgency of urination     Patient Active Problem List   Diagnosis Date Noted  . Weakness 05/31/2020  . Snoring 05/20/2019  . Insomnia 05/20/2019  . Trigeminal neuralgia of right side of face 04/15/2019  . MDD (major depressive disorder), recurrent, in partial remission ([place]) 01/22/2019  . Obesity (BMI 30.0-34.9) 01/31/2018  . 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  . Rheumatoid arthritis ([place])   . PTSD (post-traumatic stress disorder) 09/17/2017  . MDD (major depressive disorder), recurrent episode, moderate ([place]) 09/17/2017  . HTN (hypertension) 01/16/2017  . Genital herpes 01/16/2017  . History of thyroid disorder 01/16/2017  . Left thyroid nodule 01/16/2017  . Breast pain, left 01/16/2017  . History of breast cancer 01/16/2017    Past Surgical History:  Procedure Laterality Date  . ABDOMINAL HYSTERECTOMY    . ANTERIOR CERVICAL DECOMP/DISCECTOMY FUSION  05-18-2015   [facility]  . BREAST LUMPECTOMY Left 07-03-2012    [facility]  .  CYSTOSCOPY WITH RETROGRADE PYELOGRAM, URETEROSCOPY AND STENT PLACEMENT Right 09/09/2017   Procedure: CYSTOSCOPY WITH RIGHT RETROGRADE PYELOGRAM, URETEROSCOPY , STONE BASKETRY AND STENT PLACEMENT;  Surgeon: Lucas Mallow, MD;  Location: [facility];  Service: Urology;  Laterality: Right;  . IR TRANSCATHETER BX  02/02/2020  . IR US GUIDE VASC ACCESS RIGHT  02/02/2020  . IR VENOGRAM HEPATIC W HEMODYNAMIC EVALUATION  02/02/2020  . KNEE ARTHROSCOPY Left 2009  . TOTAL ABDOMINAL HYSTERECTOMY W/ BILATERAL SALPINGOOPHORECTOMY   2014     OB History   No obstetric history on file.     Family History  Problem Relation Age of Onset  . Hypertension Mother   . COPD Mother   . Depression Mother   . Hypertension Father   . Kidney disease Father   . Alcohol abuse Father   . Hypertension Brother   . Alzheimer's disease Paternal Grandmother     Social History   Tobacco Use  . Smoking status: Never Smoker  . Smokeless tobacco: Never Used  Vaping Use  . Vaping Use: Never used  Substance Use Topics  . Alcohol use: No  . Drug use: No    Home Medications Prior to Admission medications   Medication Sig Start Date End Date Taking? Authorizing Provider  amLODipine (NORVASC) 10 MG tablet TAKE 1 TABLET BY MOUTH DAILY Patient taking differently: Take 10 mg by mouth daily.  01/02/19  Yes Henson, Vickie L, NP-C  carbamazepine (TEGRETOL) 200 MG tablet Take 1 tablet (200 mg total) by mouth 2 (two) times daily. 05/31/20  Yes Suzzanne Cloud, NP  folic acid (FOLVITE) 1 MG tablet Take 1 mg by mouth daily. 10/20/18  Yes [provider]  gabapentin (NEURONTIN) 600 MG tablet Take 1 tablet (600 mg total) by mouth 2 (two) times daily. 04/15/19  Yes Kathrynn Ducking, MD  golimumab (SIMPONI ARIA) 50 MG/4ML SOLN injection Inject 50 mg into the vein every 30 (thirty) days.  03/03/19  Yes [provider]  LORazepam (ATIVAN) 0.5 MG tablet 0.25-0.5 mg daily as needed for anxiety 12/07/19  Yes [place] Crane, MD  meclizine (ANTIVERT) 25 MG tablet Take 1 tablet (25 mg total) by mouth 2 (two) times daily as needed for dizziness. 08/25/18  Yes Henson, Vickie L, NP-C  meloxicam (MOBIC) 15 MG tablet Take 15 mg by mouth daily as needed for pain. 05/27/20  Yes [provider]  methocarbamol (ROBAXIN) 500 MG tablet Take 500 mg by mouth 3 (three) times daily as needed for muscle spasms. 07/22/20  Yes [provider]  Multiple Vitamin (MULTIVITAMIN) tablet Take 1 tablet by mouth daily.   Yes [provider]    potassium chloride SA (K-DUR) 20 MEQ tablet TAKE 2 TABLETS BY MOUTH FOR 7 DAYS THEN 1 TABLET DAILY Patient taking differently: Take 20 mEq by mouth daily.  03/09/19  Yes Henson, Vickie L, NP-C  valACYclovir (VALTREX) 1000 MG tablet Take 1 tablet (1,000 mg total) by mouth 2 (two) times daily as needed. X 5 days during an outbreak Patient taking differently: Take 1,000 mg by mouth 2 (two) times daily as needed (for 5 days during an outbreak).  08/15/18  Yes Henson, Vickie L, NP-C  venlafaxine XR (EFFEXOR-XR) 150 MG 24 hr capsule Take total of 225 mg daily (150 mg + 75 mg daily) Patient taking differently: Take 150 mg by mouth daily with breakfast. Take total of 225 mg daily (150 mg + 75 mg daily) 05/26/20  Yes Norman Clay, MD  venlafaxine XR (EFFEXOR-XR) 75 MG 24 hr capsule 225 mg daily (150 mg + 75 mg daily) Patient taking differently: Take 75 mg by mouth daily with breakfast. 225 mg daily (150 mg + 75 mg daily) 05/26/20  Yes Hisada, Reina, MD  cephALEXin (KEFLEX) 500 MG capsule Take 1 capsule (500 mg total) by mouth 4 (four) times daily for 7 days. 08/15/20 08/22/20  Alfredia Client, [name]  ondansetron (ZOFRAN ODT) 4 MG disintegrating tablet Take 1 tablet (4 mg total) by mouth every 8 (eight) hours as needed for nausea or vomiting. 08/15/20   Alfredia Client, [name]    Allergies    Shellfish allergy, Bactrim [sulfamethoxazole-trimethoprim], and Chocolate flavor  Review of Systems   Review of Systems  Constitutional: Positive for chills. Negative for diaphoresis, fatigue and fever.  HENT: Negative for congestion, sore throat and trouble swallowing.   Eyes: Negative for pain and visual disturbance.  Respiratory: Negative for cough, shortness of breath and wheezing.   Cardiovascular: Negative for chest pain, palpitations and leg swelling.  Gastrointestinal: Positive for abdominal pain and nausea. Negative for abdominal distention, diarrhea and vomiting.  Genitourinary: Negative for decreased urine  volume, difficulty urinating, dysuria, flank pain, frequency, hematuria, menstrual problem, pelvic pain, urgency, vaginal bleeding, vaginal discharge and vaginal pain.  Musculoskeletal: Negative for back pain, neck pain and neck stiffness.  Skin: Negative for pallor.  Neurological: Negative for dizziness, speech difficulty, weakness and headaches.  Psychiatric/Behavioral: Negative for confusion.    Physical Exam Updated Vital Signs BP 124/78 (BP Location: Right Arm)   Pulse 80   Temp 98 F (36.7 C) (Oral)   Resp 18   Ht 5\' 3"  (1.6 m)   Wt 87.5 kg   LMP 06/11/2013   SpO2 100%   BMI 34.19 kg/m   Physical Exam Constitutional:      General: She is not in acute distress.    Appearance: Normal appearance. She is not ill-appearing, toxic-appearing or diaphoretic.  HENT:     Mouth/Throat:     Mouth: Mucous membranes are moist.     Pharynx: Oropharynx is clear.  Eyes:     General: No scleral icterus.    Extraocular Movements: Extraocular movements intact.     Pupils: Pupils are equal, round, and reactive to light.  Cardiovascular:     Rate and Rhythm: Normal rate and regular rhythm.     Pulses: Normal pulses.     Heart sounds: Normal heart sounds.  Pulmonary:     Effort: Pulmonary effort is normal. No respiratory distress.     Breath sounds: Normal breath sounds. No stridor. No wheezing, rhonchi or rales.  Chest:     Chest wall: No tenderness.  Abdominal:     General: Abdomen is flat. Bowel sounds are normal. There is no distension.     Palpations: Abdomen is soft.     Tenderness: There is abdominal tenderness in the right lower quadrant. There is no right CVA tenderness, left CVA tenderness, guarding or rebound. Positive signs include McBurney's sign. Negative signs include Murphy's sign, Rovsing's sign and psoas sign.  Musculoskeletal:        General: No swelling or tenderness. Normal range of motion.     Cervical back: Normal range of motion and neck supple. No rigidity.      Right lower leg: No edema.     Left lower leg: No edema.  Skin:    General: Skin is warm and dry.     Capillary Refill: Capillary refill takes less than  2 seconds.     Coloration: Skin is not pale.  Neurological:     General: No focal deficit present.     Mental Status: She is alert and oriented to person, place, and time.  Psychiatric:        Mood and Affect: Mood normal.        Behavior: Behavior normal.     ED Results / Procedures / Treatments   Labs (all labs ordered are listed, but only abnormal results are displayed) Labs Reviewed  COMPREHENSIVE METABOLIC PANEL - Abnormal; Notable for the following components:      Result Value   Albumin 3.1 (*)    ALT 48 (*)    All other components within normal limits  CBC WITH DIFFERENTIAL/PLATELET - Abnormal; Notable for the following components:   RBC 3.85 (*)    Hemoglobin 10.5 (*)    HCT 33.5 (*)    Platelets 423 (*)    Monocytes Absolute 1.3 (*)    All other components within normal limits  URINALYSIS, ROUTINE W REFLEX MICROSCOPIC - Abnormal; Notable for the following components:   Hgb urine dipstick MODERATE (*)    Leukocytes,Ua SMALL (*)    RBC / HPF >50 (*)    Bacteria, UA RARE (*)    All other components within normal limits  URINE CULTURE  LIPASE, BLOOD  I-STAT BETA HCG BLOOD, ED ([facility], [facility], [facility] ONLY)    EKG None  Radiology CT ABDOMEN PELVIS W CONTRAST  Result Date: 08/15/2020 CLINICAL DATA:  Right lower quadrant abdominal pain for 1 month EXAM: CT ABDOMEN AND PELVIS WITH CONTRAST TECHNIQUE: Multidetector CT imaging of the abdomen and pelvis was performed using the standard protocol following bolus administration of intravenous contrast. CONTRAST:  162mL OMNIPAQUE IOHEXOL 300 MG/ML  SOLN COMPARISON:  08/31/2019 FINDINGS: Lower chest: No acute abnormality. Hepatobiliary: No focal liver abnormality is seen. No gallstones, gallbladder wall thickening, or biliary dilatation. Pancreas: Unremarkable. No pancreatic ductal  dilatation or surrounding inflammatory changes. Spleen: Normal in size without focal abnormality. Adrenals/Urinary Tract: Unremarkable adrenal glands. There are numerous punctate renal calculi bilaterally with largest stones measuring up to 10 mm on the right and 6 mm on the left. There is an area of decreased cortical attenuation within the lower pole of the right kidney (series 4, image 40; series 7, image 87) with subtle adjacent perinephric stranding. No evidence of renal abscess or perinephric fluid collection. Left kidney enhances normally. No hydronephrosis. No ureteral calculi. Urinary bladder is partially decompressed but appears diffusely thick-walled. Stomach/Bowel: Stomach is within normal limits. Appendix appears normal (series 4, image 51). No evidence of bowel wall thickening, distention, or inflammatory changes. Vascular/Lymphatic: No significant vascular findings are present. No enlarged abdominal or pelvic lymph nodes. Reproductive: Status post hysterectomy. No adnexal masses. Other: No free fluid. No abdominopelvic fluid collection. No pneumoperitoneum. No abdominal wall hernia. Musculoskeletal: No acute or significant osseous findings. IMPRESSION: 1. Findings are suggestive of focal pyelonephritis involving the lower pole of the right kidney. No evidence of renal abscess or perinephric fluid collection. 2. Numerous bilateral nonobstructing renal calculi. 3. Urinary bladder is partially decompressed but appears diffusely thick-walled. Correlate with urinalysis to assess for cystitis. 4. No evidence of bowel inflammation.  Normal appendix. Electronically Signed   By: Davina Poke D.O.   On: 08/15/2020 12:59    Procedures Procedures (including critical care time)  Medications Ordered in ED Medications  sodium chloride 0.9 % bolus 1,000 mL (0 mLs Intravenous Stopped 08/15/20 1517)  And  0.9 %  sodium chloride infusion (has no administration in time range)  sodium chloride (PF) 0.9 %  injection (has no administration in time range)  ondansetron (ZOFRAN) injection 4 mg (4 mg Intravenous Given 08/15/20 1030)  iohexol (OMNIPAQUE) 300 MG/ML solution 100 mL (100 mLs Intravenous Contrast Given 08/15/20 1213)  cephALEXin (KEFLEX) capsule 500 mg (500 mg Oral Given 08/15/20 1431)    ED Course  I have reviewed the triage vital signs and the nursing notes.  Pertinent labs & imaging results that were available during my care of the patient were reviewed by me and considered in my medical decision making (see chart for details).    MDM Rules/Calculators/A&P                         Joanne Garrett is a 44 y.o. female with pertinent past medical history of hypertension, nephrolithiasis, breast cancer s/p lumpectomy in 2013 that presents the emergency department today for abdominal pain, more specific to the right lower quadrant since Friday.    Initial interventions Zofran, fluids. Pt does not want pain medications currently.   Labs demonstrated slightly dirty urine, normal CBC and CMP.  Normal lipase.  Patient's not pregnant.  CT scan showing nonobstructive calculi and pyelonephritis on the right side.  I think that patient's pain most likely coming from referred kidney pain.  Upon reassessment patient states that she feels much better with Zofran on board.  Will discharge home with Zofran and antibiotics for pyelonephritis.  Patient to follow-up with urology, urology referral given.  Patient passed p.o. challenge with first dose of Keflex here.  Patient to be discharged.     Doubt need for further emergent work up at this time. I explained the diagnosis and have given explicit precautions to return to the ER including for any other new or worsening symptoms. The patient understands and accepts the medical plan as it's been dictated and I have answered their questions. Discharge instructions concerning home care and prescriptions have been given. The patient is STABLE and is  discharged to home in good condition.  I discussed this case with my attending physician who cosigned this note including patient's presenting symptoms, physical exam, and planned diagnostics and interventions. Attending physician stated agreement with plan or made changes to plan which were implemented.   Final Clinical Impression(s) / ED Diagnoses Final diagnoses:  Pyelonephritis    Rx / DC Orders ED Discharge Orders         Ordered    ondansetron (ZOFRAN ODT) 4 MG disintegrating tablet  Every 8 hours PRN        08/15/20 1406    cephALEXin (KEFLEX) 500 MG capsule  4 times daily        08/15/20 1406           Alfredia Client, [name] 08/15/20 1536    Blanchie Dessert, MD 08/17/20 1345

## 2020-08-16 LAB — URINE CULTURE

## 2020-08-18 ENCOUNTER — Other Ambulatory Visit: Payer: Self-pay

## 2020-08-18 ENCOUNTER — Telehealth (INDEPENDENT_AMBULATORY_CARE_PROVIDER_SITE_OTHER): Payer: Self-pay | Admitting: Psychiatry

## 2020-08-18 ENCOUNTER — Encounter (HOSPITAL_COMMUNITY): Payer: Self-pay | Admitting: Psychiatry

## 2020-08-18 DIAGNOSIS — F4310 Post-traumatic stress disorder, unspecified: Secondary | ICD-10-CM

## 2020-08-18 DIAGNOSIS — F3342 Major depressive disorder, recurrent, in full remission: Secondary | ICD-10-CM

## 2020-08-18 MED ORDER — VENLAFAXINE HCL ER 75 MG PO CP24: ORAL_CAPSULE | ORAL | 1 refills | Status: DC

## 2020-08-18 MED ORDER — VENLAFAXINE HCL ER 150 MG PO CP24: ORAL_CAPSULE | ORAL | 1 refills | Status: DC

## 2020-08-18 NOTE — Patient Instructions (Signed)
1.Continuevenlafaxine225mg  daily  2. Continue ativan 0.25-0.5 mg daily as needed for anxiety  3.Next appointment: 2/10 at 9:10

## 2020-09-28 ENCOUNTER — Other Ambulatory Visit (HOSPITAL_COMMUNITY): Payer: Self-pay | Admitting: Psychiatry

## 2020-09-28 DIAGNOSIS — F3341 Major depressive disorder, recurrent, in partial remission: Secondary | ICD-10-CM

## 2020-09-28 DIAGNOSIS — F4310 Post-traumatic stress disorder, unspecified: Secondary | ICD-10-CM

## 2020-10-03 ENCOUNTER — Ambulatory Visit: Payer: No Typology Code available for payment source | Admitting: Neurology

## 2020-10-17 ENCOUNTER — Telehealth: Payer: Self-pay

## 2020-10-17 ENCOUNTER — Other Ambulatory Visit: Payer: Self-pay | Admitting: Psychiatry

## 2020-10-17 DIAGNOSIS — F3341 Major depressive disorder, recurrent, in partial remission: Secondary | ICD-10-CM

## 2020-10-17 DIAGNOSIS — F4310 Post-traumatic stress disorder, unspecified: Secondary | ICD-10-CM

## 2020-10-17 MED ORDER — LORAZEPAM 0.5 MG PO TABS: ORAL_TABLET | ORAL | 2 refills | Status: DC

## 2020-10-17 NOTE — Telephone Encounter (Signed)
Ordered

## 2020-10-17 NOTE — Telephone Encounter (Signed)
pt called left message that she needed a refill on the lorazepam

## 2020-11-20 IMAGING — DX DG CHEST 2V
2 series · 2 of 2 positions shown · non-contrast
Comparison: None.

CLINICAL DATA: Productive cough.  Shortness of breath.

EXAM:
CHEST - 2 VIEW

[chest pa]
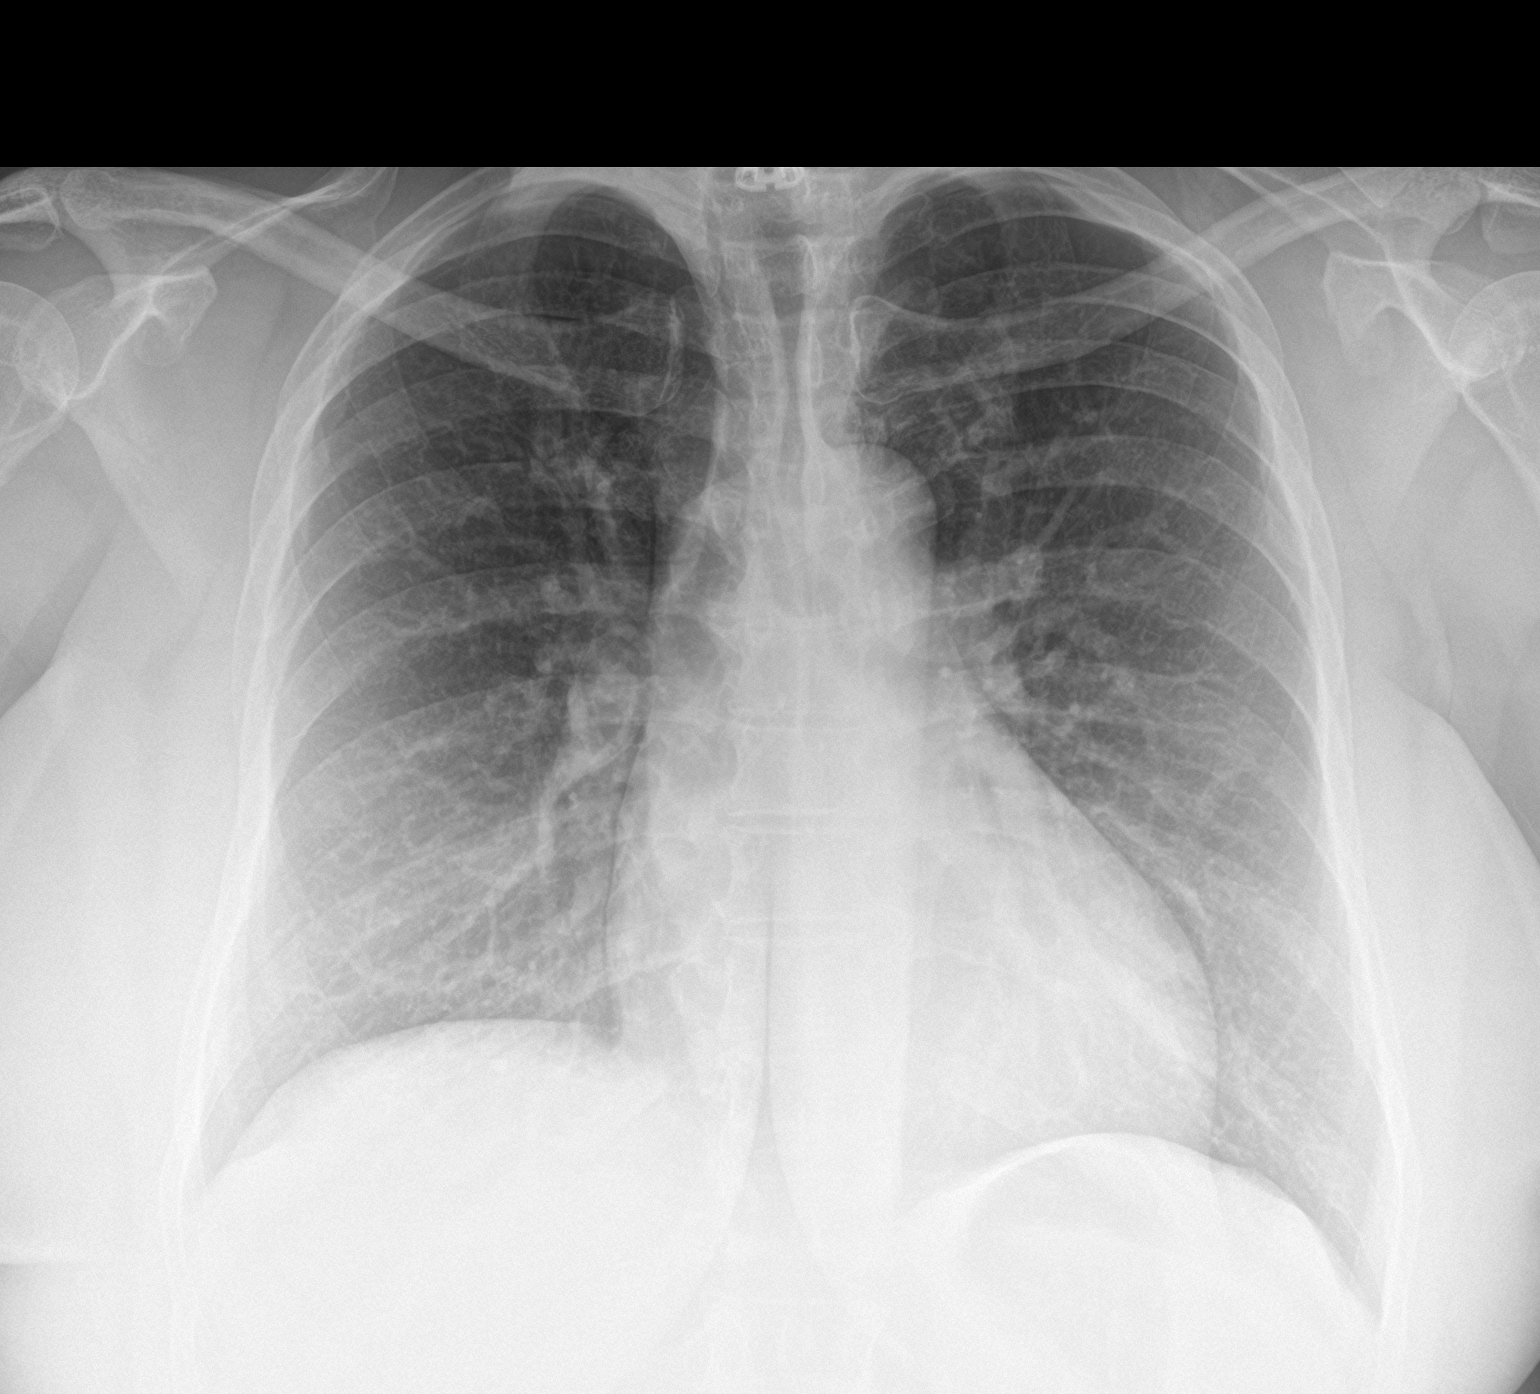

[chest lat]
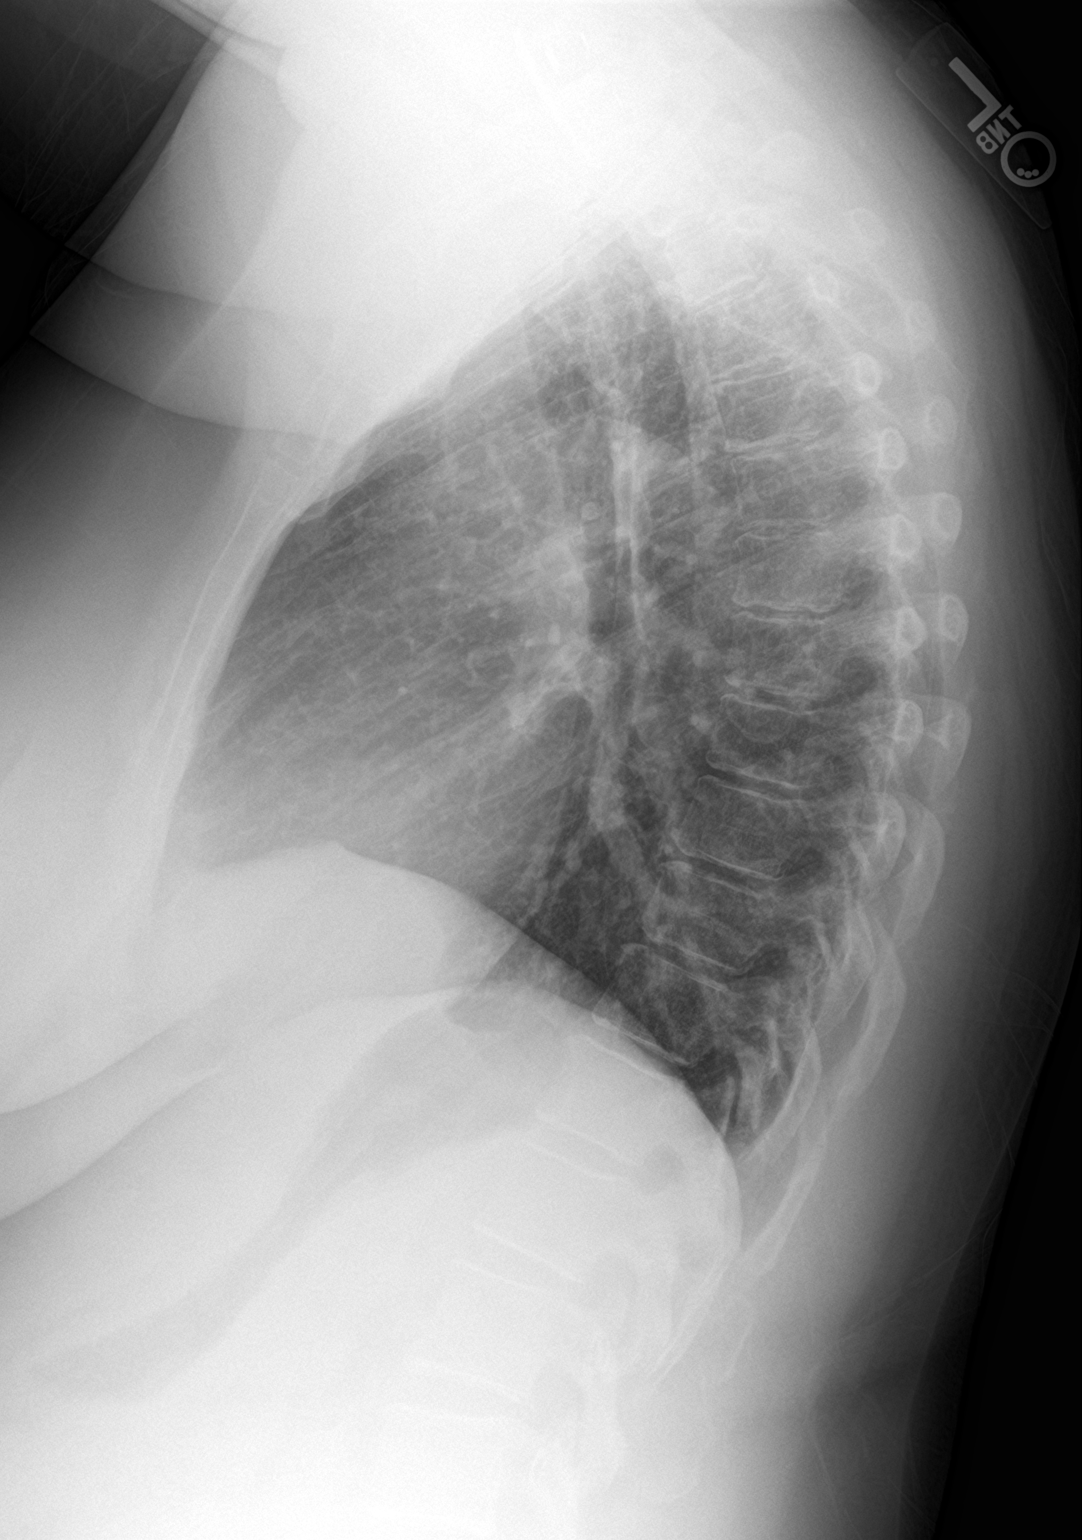

[2 of 2 positions shown; findings below may reference images not displayed]

FINDINGS: The heart size and mediastinal contours are within normal limits.
Both lungs are clear. The visualized skeletal structures are
unremarkable.
IMPRESSION: No active cardiopulmonary disease.
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
## 2020-12-15 ENCOUNTER — Telehealth (HOSPITAL_COMMUNITY): Payer: Self-pay | Admitting: Psychiatry

## 2020-12-15 ENCOUNTER — Telehealth: Payer: Self-pay | Admitting: Psychiatry

## 2020-12-26 ENCOUNTER — Emergency Department (HOSPITAL_COMMUNITY): Payer: Self-pay

## 2020-12-26 ENCOUNTER — Emergency Department (HOSPITAL_COMMUNITY)
Admission: EM | Admit: 2020-12-26 | Discharge: 2020-12-26 | Disposition: A | Discharge status: Home / Self Care | Payer: Self-pay | Attending: Emergency Medicine | Admitting: Emergency Medicine

## 2020-12-26 ENCOUNTER — Emergency Department (HOSPITAL_BASED_OUTPATIENT_CLINIC_OR_DEPARTMENT_OTHER): Payer: Self-pay

## 2020-12-26 DIAGNOSIS — R0989 Other specified symptoms and signs involving the circulatory and respiratory systems: Secondary | ICD-10-CM

## 2020-12-26 DIAGNOSIS — R609 Edema, unspecified: Secondary | ICD-10-CM

## 2020-12-26 DIAGNOSIS — M792 Neuralgia and neuritis, unspecified: Secondary | ICD-10-CM | POA: Insufficient documentation

## 2020-12-26 DIAGNOSIS — E039 Hypothyroidism, unspecified: Secondary | ICD-10-CM | POA: Insufficient documentation

## 2020-12-26 DIAGNOSIS — Z853 Personal history of malignant neoplasm of breast: Secondary | ICD-10-CM | POA: Insufficient documentation

## 2020-12-26 DIAGNOSIS — I10 Essential (primary) hypertension: Secondary | ICD-10-CM | POA: Insufficient documentation

## 2020-12-26 DIAGNOSIS — Z881 Allergy status to other antibiotic agents status: Secondary | ICD-10-CM | POA: Insufficient documentation

## 2020-12-26 DIAGNOSIS — R310 Gross hematuria: Secondary | ICD-10-CM | POA: Insufficient documentation

## 2020-12-26 DIAGNOSIS — Z79899 Other long term (current) drug therapy: Secondary | ICD-10-CM | POA: Insufficient documentation

## 2020-12-26 DIAGNOSIS — M79661 Pain in right lower leg: Secondary | ICD-10-CM

## 2020-12-26 LAB — URINALYSIS, ROUTINE W REFLEX MICROSCOPIC
Bilirubin Urine: NEGATIVE
Glucose, UA: NEGATIVE mg/dL
Ketones, ur: NEGATIVE mg/dL
Nitrite: NEGATIVE
Protein, ur: NEGATIVE mg/dL
Specific Gravity, Urine: 1.013 (ref 1.005–1.030)
pH: 7 (ref 5.0–8.0)

## 2020-12-26 LAB — D-DIMER, QUANTITATIVE: D-Dimer, Quant: 3.8 ug/mL-FEU — ABNORMAL HIGH (ref 0.00–0.50)

## 2020-12-26 MED ORDER — METHYLPREDNISOLONE 4 MG PO TBPK: ORAL_TABLET | ORAL | 0 refills | Status: DC

## 2020-12-26 MED ORDER — RIVAROXABAN 15 MG PO TABS
15.0000 mg | ORAL_TABLET | Freq: Once | ORAL | Status: AC
  Administered 2020-12-26: 15 mg via ORAL
  Filled 2020-12-26: qty 1

## 2020-12-26 MED ORDER — METHOCARBAMOL 500 MG PO TABS: 500.0000 mg | ORAL_TABLET | Freq: Four times a day (QID) | ORAL | 0 refills | Status: DC | PRN

## 2020-12-26 MED ORDER — AMLODIPINE BESYLATE 10 MG PO TABS: 10.0000 mg | ORAL_TABLET | Freq: Every day | ORAL | 0 refills | Status: DC

## 2020-12-26 MED ORDER — GABAPENTIN 600 MG PO TABS: 600.0000 mg | ORAL_TABLET | Freq: Two times a day (BID) | ORAL | 0 refills | Status: DC

## 2020-12-26 NOTE — ED Triage Notes (Signed)
Pt came in with c/o R sided groin and knee pain. Pt's R knee appears swollen. Pt states it started on Friday at work. It then got slightly better and started back today. Pt states that her R groin is hurting as well, and when she wiped after urinating this afternoon there was blood.

## 2020-12-26 NOTE — ED Provider Notes (Signed)
Awaiting results from DVT study to rule out DVT.  If negative, anticipate discharge. Physical Exam  BP (!) 153/110 (BP Location: Right Arm)   Pulse 84   Temp 98.1 F (36.7 C) (Oral)   Resp 18   Ht 5\' 3"  (1.6 m)   Wt 86.2 kg   LMP 06/11/2013   SpO2 100%   BMI 33.66 kg/m   Physical Exam Patient is alert with clear mental status.  There is no reproducible pain of the flank or the buttock.  No soft tissue abnormalities.  Patient identifies pain is emanating from her groin region and radiating down the leg to the knee.  Groin does not have any mass or swelling.  No significant reproducible tenderness.  Distal pulses are 2+ and symmetric.  No effusions at the knee. ED Course/Procedures     Procedures  MDM  DVT ruled out.  Patient has normal neurovascular exam.  Pain is reproduced by certain positions and has a lancinating sharp quality radiating to the knee.  At this time I have high suspicion for a nerve impingement.  Patient had gotten some relief after she took a dose from an old Solu-Medrol pack.  We will continue with the Medrol Dosepak and patient can use Neurontin and Robaxin as needed.  Return precautions reviewed.  Patient requests a refill of her amlodipine 10 mg.       Charlesetta Shanks, MD 12/26/20 [phone]

## 2020-12-26 NOTE — Discharge Instructions (Addendum)
1.  A culture has been done of your urine.  If this shows signs of infection, you will be called to get antibiotics.  Return to the emergency department if you develop fever, pain in your flanks or other concerning symptoms. 2.  Your pain suggests nerve pain.  There was no blood clot present on your ultrasound.  This time, plan will be to start a Medrol dose pack, use Neurontin and Robaxin as needed.  If you are getting any weakness, pain is getting worse or changing, you will need emergent or urgent reassessment. 3.  Schedule a follow-up appointment with your family doctor for recheck within the next 5 to 7 days. 4.  You are being restarted on your blood pressure medication, amlodipine.  Start taking this daily.  Monitor your blood pressures at home.  Review your blood pressure response with your doctor.

## 2020-12-26 NOTE — ED Notes (Signed)
Patient transported to X-ray 

## 2020-12-26 NOTE — ED Provider Notes (Signed)
[facility] Provider Note: Joanne Spurling, MD, FACEP  CSN: [id number] MRN: [id number] ARRIVAL: 12/26/20 at [place]: [place]  Leg Pain   HISTORY OF PRESENT ILLNESS  12/26/20 3:43 AM Joanne Garrett is a 45 y.o. female with pain and swelling in her right lower extremity which began 3 days ago.  Symptoms had improved but worsened yesterday.  Pain is now moderate worse with movement.  The pain begins in her right groin fold and radiates to her right posterior knee.  She has swelling of the entire right lower extremity.  There is no erythema or warmth.  She denies injury.  She also states she had gross blood in her urine earlier.  She states she has chronic hematuria due to kidney stones.  She denies dysuria.   Past Medical History:  Diagnosis Date  . Anxiety   . 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 Depression   . Genital herpes   . Hematuria   . History of hyperthyroidism 02/2001   s/p  RAI 03-2001  . History of panic attacks   . History of radiation therapy    completed 09-23-2012  50Gy in 25 fractions and boost completed [phone]  . History of thyroid nodule   . Hypertension   . Renal calculus, right   . Rheumatoid arthritis ([place])   . Trigeminal neuralgia of right side of face 04/15/2019   V2  . Urgency of urination     Past Surgical History:  Procedure Laterality Date  . ABDOMINAL HYSTERECTOMY    . ANTERIOR CERVICAL DECOMP/DISCECTOMY FUSION  05-18-2015   [facility]  . BREAST LUMPECTOMY Left 07-03-2012    [facility]  . CYSTOSCOPY WITH RETROGRADE PYELOGRAM, URETEROSCOPY AND STENT PLACEMENT Right 09/09/2017   Procedure: CYSTOSCOPY WITH RIGHT RETROGRADE PYELOGRAM, URETEROSCOPY , STONE BASKETRY AND STENT PLACEMENT;  Surgeon: Lucas Mallow, MD;  Location: [facility];   Service: Urology;  Laterality: Right;  . IR TRANSCATHETER BX  02/02/2020  . IR US GUIDE VASC ACCESS RIGHT  02/02/2020  . IR VENOGRAM HEPATIC W HEMODYNAMIC EVALUATION  02/02/2020  . KNEE ARTHROSCOPY Left 2009  . TOTAL ABDOMINAL HYSTERECTOMY W/ BILATERAL SALPINGOOPHORECTOMY  2014    Family History  Problem Relation Age of Onset  . Hypertension Mother   . COPD Mother   . Depression Mother   . Hypertension Father   . Kidney disease Father   . Alcohol abuse Father   . Hypertension Brother   . Alzheimer's disease Paternal Grandmother     Social History   Tobacco Use  . Smoking status: Never Smoker  . Smokeless tobacco: Never Used  Vaping Use  . Vaping Use: Never used  Substance Use Topics  . Alcohol use: No  . Drug use: No    Prior to Admission medications   Medication Sig Start Date End Date Taking? Authorizing Provider  amLODipine (NORVASC) 10 MG tablet TAKE 1 TABLET BY MOUTH DAILY Patient taking differently: Take 10 mg by mouth daily. 01/02/19  Yes Henson, Vickie L, NP-C  amLODipine (NORVASC) 10 MG tablet Take 1 tablet (10 mg total) by mouth daily. 12/26/20  Yes Charlesetta Shanks, MD  carbamazepine (TEGRETOL) 200 MG tablet Take 1 tablet (200 mg total) by mouth 2 (two) times daily. Patient taking differently: Take 200 mg by mouth 2 (two)  times daily as needed. 05/31/20  Yes Suzzanne Cloud, NP  folic acid (FOLVITE) 1 MG tablet Take 1 mg by mouth daily. 10/20/18  Yes [provider]  gabapentin (NEURONTIN) 600 MG tablet Take 1 tablet (600 mg total) by mouth 2 (two) times daily. Patient taking differently: Take 600 mg by mouth 2 (two) times daily as needed. 04/15/19  Yes Kathrynn Ducking, MD  gabapentin (NEURONTIN) 600 MG tablet Take 1 tablet (600 mg total) by mouth 2 (two) times daily. 12/26/20  Yes Charlesetta Shanks, MD  golimumab (SIMPONI ARIA) 50 MG/4ML SOLN injection Inject 50 mg into the vein every 30 (thirty) days.  03/03/19  Yes [provider]  LORazepam (ATIVAN)  0.5 MG tablet 0.25-0.5 mg daily as needed for anxiety 10/17/20  Yes Hisada, Elie Goody, MD  meclizine (ANTIVERT) 25 MG tablet Take 1 tablet (25 mg total) by mouth 2 (two) times daily as needed for dizziness. 08/25/18  Yes Henson, Vickie L, NP-C  meloxicam (MOBIC) 15 MG tablet Take 15 mg by mouth daily as needed for pain. 05/27/20  Yes [provider]  methocarbamol (ROBAXIN) 500 MG tablet Take 500 mg by mouth 3 (three) times daily as needed for muscle spasms. 07/22/20  Yes [provider]  methocarbamol (ROBAXIN) 500 MG tablet Take 1 tablet (500 mg total) by mouth every 6 (six) hours as needed for muscle spasms. 12/26/20  Yes Charlesetta Shanks, MD  Methotrexate Sodium (METHOTREXATE, PF,) 50 MG/2ML injection Inject 0.8 mLs into the muscle once a week. 12/14/20  Yes [provider]  methylPREDNISolone (MEDROL DOSEPAK) 4 MG TBPK tablet Per pack instruction 12/26/20  Yes Pfeiffer, Jeannie Done, MD  Multiple Vitamin (MULTIVITAMIN) tablet Take 1 tablet by mouth daily.   Yes [provider]  ondansetron (ZOFRAN ODT) 4 MG disintegrating tablet Take 1 tablet (4 mg total) by mouth every 8 (eight) hours as needed for nausea or vomiting. 08/15/20  Yes Patel, Shalyn, [name]  potassium chloride SA (K-DUR) 20 MEQ tablet TAKE 2 TABLETS BY MOUTH FOR 7 DAYS THEN 1 TABLET DAILY Patient taking differently: Take 20 mEq by mouth daily. 03/09/19  Yes Henson, Vickie L, NP-C  valACYclovir (VALTREX) 1000 MG tablet Take 1 tablet (1,000 mg total) by mouth 2 (two) times daily as needed. X 5 days during an outbreak Patient taking differently: Take 1,000 mg by mouth 2 (two) times daily as needed (for 5 days during an outbreak). 08/15/18  Yes Henson, Vickie L, NP-C  venlafaxine XR (EFFEXOR-XR) 150 MG 24 hr capsule Take total of 225 mg daily (150 mg + 75 mg daily) 08/18/20  Yes Hisada, Elie Goody, MD  venlafaxine XR (EFFEXOR-XR) 75 MG 24 hr capsule 225 mg daily (150 mg + 75 mg daily) 08/18/20  Yes Hisada, Reina, MD     Allergies Shellfish allergy, Bactrim [sulfamethoxazole-trimethoprim], and Chocolate flavor   REVIEW OF SYSTEMS  Negative except as noted here or in the History of Present Illness.   PHYSICAL EXAMINATION  Initial Vital Signs Blood pressure (!) 152/98, pulse 86, temperature 98.1 F (36.7 C), temperature source Oral, resp. rate 18, height 5\' 3"  (1.6 m), weight 86.2 kg, last menstrual period 06/11/2013, SpO2 97 %.  Examination General: Well-developed, well-nourished female in no acute distress; appearance consistent with age of record HENT: normocephalic; atraumatic Eyes: Normal appearance Neck: supple Heart: regular rate and rhythm Lungs: clear to auscultation bilaterally Abdomen: soft; nondistended; nontender; bowel sounds present Extremities: No deformity; full range of motion; pulses normal; mild edema of the entire right lower  leg with tenderness notably at the right groin fold and right popliteal fossa Neurologic: Awake, alert and oriented; motor function intact in all extremities and symmetric; no facial droop Skin: Warm and dry Psychiatric: Normal mood and affect   RESULTS  Summary of this visit's results, reviewed and interpreted by myself:   EKG Interpretation  Date/Time:    Ventricular Rate:    PR Interval:    QRS Duration:   QT Interval:    QTC Calculation:   R Axis:     Text Interpretation:        Laboratory Studies: Results for orders placed or performed during the hospital encounter of 12/26/20 (from the past 24 hour(s))  Urinalysis, Routine w reflex microscopic Urine, Clean Catch     Status: Abnormal   Collection Time: 12/26/20  3:37 AM  Result Value Ref Range   Color, Urine STRAW (A) YELLOW   APPearance CLEAR CLEAR   Specific Gravity, Urine 1.013 1.005 - 1.030   pH 7.0 5.0 - 8.0   Glucose, UA NEGATIVE NEGATIVE mg/dL   Hgb urine dipstick MODERATE (A) NEGATIVE   Bilirubin Urine NEGATIVE NEGATIVE   Ketones, ur NEGATIVE NEGATIVE mg/dL   Protein,  ur NEGATIVE NEGATIVE mg/dL   Nitrite NEGATIVE NEGATIVE   Leukocytes,Ua SMALL (A) NEGATIVE   RBC / HPF 21-50 0 - 5 RBC/hpf   WBC, UA 11-20 0 - 5 WBC/hpf   Bacteria, UA RARE (A) NONE SEEN   Squamous Epithelial / LPF 0-5 0 - 5   Mucus PRESENT   D-dimer, quantitative     Status: Abnormal   Collection Time: 12/26/20  3:55 AM  Result Value Ref Range   D-Dimer, Quant 3.80 (H) 0.00 - 0.50 ug/mL-FEU   Imaging Studies: DG Knee Complete 4 Views Right  Result Date: 12/26/2020 CLINICAL DATA:  Knee pain and swelling EXAM: RIGHT KNEE - COMPLETE 4+ VIEW COMPARISON:  None. FINDINGS: No evidence of fracture, dislocation, or joint effusion. The earliest of marginal spurring; no joint space narrowing IMPRESSION: No acute finding or degenerative joint narrowing. Electronically Signed   By: Monte Fantasia M.D.   On: 12/26/2020 03:59   VAS [place] LOWER EXTREMITY VENOUS (DVT) (ONLY [facility] & [facility])  Result Date: 12/26/2020  Lower Venous DVT Study Indications: Edema, and RLE Groin and knee pain since 12/23/20.  Comparison Study: No previous studies. Performing Technologist: Rogelia Rohrer  Examination Guidelines: A complete evaluation includes B-mode imaging, spectral Doppler, color Doppler, and power Doppler as needed of all accessible portions of each vessel. Bilateral testing is considered an integral part of a complete examination. Limited examinations for reoccurring indications may be performed as noted. The reflux portion of the exam is performed with the patient in reverse Trendelenburg.  +---------+---------------+---------+-----------+----------+--------------+ RIGHT    CompressibilityPhasicitySpontaneityPropertiesThrombus Aging +---------+---------------+---------+-----------+----------+--------------+ CFV      Full           Yes      Yes                                 +---------+---------------+---------+-----------+----------+--------------+ SFJ      Full                                                         +---------+---------------+---------+-----------+----------+--------------+ FV Prox  Full           Yes      Yes                                 +---------+---------------+---------+-----------+----------+--------------+ FV Mid   Full           Yes      Yes                                 +---------+---------------+---------+-----------+----------+--------------+ FV DistalFull           Yes      Yes                                 +---------+---------------+---------+-----------+----------+--------------+ PFV      Full                                                        +---------+---------------+---------+-----------+----------+--------------+ POP      Full           Yes      Yes                                 +---------+---------------+---------+-----------+----------+--------------+ PTV      Full                                                        +---------+---------------+---------+-----------+----------+--------------+ PERO     Full                                                        +---------+---------------+---------+-----------+----------+--------------+   +----+---------------+---------+-----------+----------+--------------+ LEFTCompressibilityPhasicitySpontaneityPropertiesThrombus Aging +----+---------------+---------+-----------+----------+--------------+ CFV Full           Yes      Yes                                 +----+---------------+---------+-----------+----------+--------------+     Summary: RIGHT: - There is no evidence of deep vein thrombosis in the lower extremity. - There is no evidence of superficial venous thrombosis.  - No cystic structure found in the popliteal fossa.  LEFT: - No evidence of common femoral vein obstruction.  *See table(s) above for measurements and observations. Electronically signed by Ruta Hinds MD on 12/26/2020 at 5:53:00 PM.    Final     ED COURSE and MDM  Nursing notes, initial  and subsequent vitals signs, including pulse oximetry, reviewed and interpreted by myself.  Vitals:   12/26/20 0600 12/26/20 0830 12/26/20 0910 12/26/20 0930  BP: (!) 157/92  (!) 153/110 (!) 147/104  Pulse: 90 83 84 77  Resp: 19  18   Temp:      TempSrc:  SpO2: 100% 100% 100% 100%  Weight:      Height:       Medications  Rivaroxaban (XARELTO) tablet 15 mg (15 mg Oral Given 12/26/20 0522)    4:53 AM I have a high index of suspicion for DVT of the right leg.  D-dimer is elevated as well.  We will administer a dose of Xarelto and obtain venous Doppler in the ED.  7:30 AM Signed out to Dr. Johnney Killian. Doppler US pending.  PROCEDURES  Procedures   ED DIAGNOSES     ICD-10-CM   1. Suspected deep vein thrombosis (DVT)  R09.89   2. Gross hematuria  R31.0   3. Neuralgia  M79.2        Sarae Nicholes, Jenny Reichmann, MD 12/26/20 2229

## 2020-12-26 NOTE — CV Procedure (Signed)
RLE venous duplex completed. Dr. Johnney Killian given preliminary results.  Results can be found under chart review under CV PROC. 12/26/2020 9:44 AM Donte Kary RVT, RDMS

## 2020-12-28 LAB — URINE CULTURE: Culture: 50000 — AB

## 2020-12-29 ENCOUNTER — Telehealth: Payer: Self-pay | Admitting: Emergency Medicine

## 2020-12-29 NOTE — Telephone Encounter (Signed)
Post ED Visit - Positive Culture Follow-up  Culture report reviewed by antimicrobial stewardship pharmacist: [place] Team []  Elenor Quinones, Pharm.D. []  Heide Guile, Pharm.D., BCPS AQ-ID []  Parks Neptune, Pharm.D., BCPS []  Alycia Rossetti, Pharm.D., BCPS []  Castella, Pharm.D., BCPS, AAHIVP []  Legrand Como, Pharm.D., BCPS, AAHIVP []  Salome Arnt, PharmD, BCPS []  Johnnette Gourd, PharmD, BCPS []  Hughes Better, PharmD, BCPS []  Leeroy Cha, PharmD []  Laqueta Linden, PharmD, BCPS []  Albertina Parr, PharmD  [place] Team []  Leodis Sias, PharmD []  Lindell Spar, PharmD []  Royetta Asal, PharmD []  Graylin Shiver, Rph []  Rema Fendt) Glennon Mac, PharmD []  Arlyn Dunning, PharmD []  Netta Cedars, PharmD []  Dia Sitter, PharmD []  Leone Haven, PharmD []  Gretta Arab, PharmD []  Theodis Shove, PharmD []  Peggyann Juba, PharmD []  Reuel Boom, PharmD   Positive urine culture Treated with none, asymptomatic,no further patient follow-up is required at this time.  Hazle Nordmann 12/29/2020, 9:30 AM
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
## 2021-01-05 ENCOUNTER — Encounter (HOSPITAL_COMMUNITY): Payer: Self-pay

## 2021-01-05 ENCOUNTER — Emergency Department (HOSPITAL_COMMUNITY)
Admission: EM | Admit: 2021-01-05 | Discharge: 2021-01-05 | Disposition: A | Discharge status: Home / Self Care | Payer: Self-pay | Attending: Emergency Medicine | Admitting: Emergency Medicine

## 2021-01-05 ENCOUNTER — Other Ambulatory Visit: Payer: Self-pay

## 2021-01-05 DIAGNOSIS — B9620 Unspecified Escherichia coli [E. coli] as the cause of diseases classified elsewhere: Secondary | ICD-10-CM | POA: Insufficient documentation

## 2021-01-05 DIAGNOSIS — Z79899 Other long term (current) drug therapy: Secondary | ICD-10-CM | POA: Insufficient documentation

## 2021-01-05 DIAGNOSIS — I10 Essential (primary) hypertension: Secondary | ICD-10-CM | POA: Insufficient documentation

## 2021-01-05 DIAGNOSIS — Z85850 Personal history of malignant neoplasm of thyroid: Secondary | ICD-10-CM | POA: Insufficient documentation

## 2021-01-05 DIAGNOSIS — N3001 Acute cystitis with hematuria: Secondary | ICD-10-CM

## 2021-01-05 DIAGNOSIS — Z853 Personal history of malignant neoplasm of breast: Secondary | ICD-10-CM | POA: Insufficient documentation

## 2021-01-05 DIAGNOSIS — N390 Urinary tract infection, site not specified: Secondary | ICD-10-CM | POA: Insufficient documentation

## 2021-01-05 MED ORDER — CEPHALEXIN 500 MG PO CAPS: 500.0000 mg | ORAL_CAPSULE | Freq: Four times a day (QID) | ORAL | 0 refills | Status: DC

## 2021-01-05 MED ORDER — CEPHALEXIN 500 MG PO CAPS
500.0000 mg | ORAL_CAPSULE | Freq: Once | ORAL | Status: AC
  Administered 2021-01-05: 500 mg via ORAL
  Filled 2021-01-05: qty 1

## 2021-01-05 NOTE — ED Notes (Signed)
Pt verbalized understanding of d/c, medication, and follow up care. Ambulatory with steady gait.  

## 2021-01-05 NOTE — ED Provider Notes (Signed)
[facility] DEPT Provider Note   CSN: [id number] Arrival date & time: 01/05/21  0423     History Chief Complaint  Patient presents with  . Vaginal Bleeding    Joanne Garrett is a 45 y.o. female.  Patient to ED with concern for bleeding with urination that started 10 days ago and worsened today. No fever, dysuria. She is s/p hysterectomy. She reports history of kidney stones but denies flank pain. No nausea, vomiting.  The history is provided by the patient. No language interpreter was used.  Vaginal Bleeding Associated symptoms: no abdominal pain, no dysuria and no fever        Past Medical History:  Diagnosis Date  . Anxiety   . 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 Depression   . Genital herpes   . Hematuria   . History of hyperthyroidism 02/2001   s/p  RAI 03-2001  . History of panic attacks   . History of radiation therapy    completed 09-23-2012  50Gy in 25 fractions and boost completed [phone]  . History of thyroid nodule   . Hypertension   . Renal calculus, right   . Rheumatoid arthritis ([place])   . Trigeminal neuralgia of right side of face 04/15/2019   V2  . Urgency of urination     Patient Active Problem List   Diagnosis Date Noted  . Weakness 05/31/2020  . Snoring 05/20/2019  . Insomnia 05/20/2019  . Trigeminal neuralgia of right side of face 04/15/2019  . MDD (major depressive disorder), recurrent, in partial remission ([place]) 01/22/2019  . Obesity (BMI 30.0-34.9) 01/31/2018  . 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  . Rheumatoid arthritis ([place])   . PTSD (post-traumatic stress disorder) 09/17/2017  . MDD (major depressive disorder), recurrent episode, moderate ([place]) 09/17/2017  . HTN (hypertension) 01/16/2017  . Genital herpes 01/16/2017  . History of  thyroid disorder 01/16/2017  . Left thyroid nodule 01/16/2017  . Breast pain, left 01/16/2017  . History of breast cancer 01/16/2017    Past Surgical History:  Procedure Laterality Date  . ABDOMINAL HYSTERECTOMY    . ANTERIOR CERVICAL DECOMP/DISCECTOMY FUSION  05-18-2015   [facility]  . BREAST LUMPECTOMY Left 07-03-2012    [facility]  . CYSTOSCOPY WITH RETROGRADE PYELOGRAM, URETEROSCOPY AND STENT PLACEMENT Right 09/09/2017   Procedure: CYSTOSCOPY WITH RIGHT RETROGRADE PYELOGRAM, URETEROSCOPY , STONE BASKETRY AND STENT PLACEMENT;  Surgeon: Lucas Mallow, MD;  Location: [facility];  Service: Urology;  Laterality: Right;  . IR TRANSCATHETER BX  02/02/2020  . IR US GUIDE VASC ACCESS RIGHT  02/02/2020  . IR VENOGRAM HEPATIC W HEMODYNAMIC EVALUATION  02/02/2020  . KNEE ARTHROSCOPY Left 2009  . TOTAL ABDOMINAL HYSTERECTOMY W/ BILATERAL SALPINGOOPHORECTOMY  2014     OB History   No obstetric history on file.     Family History  Problem Relation Age of Onset  . Hypertension Mother   . COPD Mother   . Depression Mother   . Hypertension Father   . Kidney disease Father   . Alcohol abuse Father   . Hypertension Brother   . Alzheimer's disease Paternal Grandmother     Social History   Tobacco Use  . Smoking status: Never Smoker  . Smokeless tobacco: Never Used  Vaping Use  . Vaping Use: Never used  Substance Use  Topics  . Alcohol use: No  . Drug use: No    Home Medications Prior to Admission medications   Medication Sig Start Date End Date Taking? Authorizing Provider  amLODipine (NORVASC) 10 MG tablet TAKE 1 TABLET BY MOUTH DAILY Patient taking differently: Take 10 mg by mouth daily. 01/02/19   Henson, Vickie L, NP-C  amLODipine (NORVASC) 10 MG tablet Take 1 tablet (10 mg total) by mouth daily. 12/26/20   Charlesetta Shanks, MD  carbamazepine (TEGRETOL) 200 MG tablet Take 1 tablet (200 mg total) by mouth 2 (two) times daily. Patient taking differently: Take 200 mg by  mouth 2 (two) times daily as needed. 05/31/20   Suzzanne Cloud, NP  folic acid (FOLVITE) 1 MG tablet Take 1 mg by mouth daily. 10/20/18   [provider]  gabapentin (NEURONTIN) 600 MG tablet Take 1 tablet (600 mg total) by mouth 2 (two) times daily. Patient taking differently: Take 600 mg by mouth 2 (two) times daily as needed. 04/15/19   Kathrynn Ducking, MD  gabapentin (NEURONTIN) 600 MG tablet Take 1 tablet (600 mg total) by mouth 2 (two) times daily. 12/26/20   Charlesetta Shanks, MD  golimumab (SIMPONI ARIA) 50 MG/4ML SOLN injection Inject 50 mg into the vein every 30 (thirty) days.  03/03/19   [provider]  LORazepam (ATIVAN) 0.5 MG tablet 0.25-0.5 mg daily as needed for anxiety 10/17/20   Norman Clay, MD  meclizine (ANTIVERT) 25 MG tablet Take 1 tablet (25 mg total) by mouth 2 (two) times daily as needed for dizziness. 08/25/18   Henson, Vickie L, NP-C  meloxicam (MOBIC) 15 MG tablet Take 15 mg by mouth daily as needed for pain. 05/27/20   [provider]  methocarbamol (ROBAXIN) 500 MG tablet Take 500 mg by mouth 3 (three) times daily as needed for muscle spasms. 07/22/20   [provider]  methocarbamol (ROBAXIN) 500 MG tablet Take 1 tablet (500 mg total) by mouth every 6 (six) hours as needed for muscle spasms. 12/26/20   Charlesetta Shanks, MD  Methotrexate Sodium (METHOTREXATE, PF,) 50 MG/2ML injection Inject 0.8 mLs into the muscle once a week. 12/14/20   [provider]  methylPREDNISolone (MEDROL DOSEPAK) 4 MG TBPK tablet Per pack instruction 12/26/20   Charlesetta Shanks, MD  Multiple Vitamin (MULTIVITAMIN) tablet Take 1 tablet by mouth daily.    [provider]  ondansetron (ZOFRAN ODT) 4 MG disintegrating tablet Take 1 tablet (4 mg total) by mouth every 8 (eight) hours as needed for nausea or vomiting. 08/15/20   Alfredia Client, [name]  potassium chloride SA (K-DUR) 20 MEQ tablet TAKE 2 TABLETS BY MOUTH FOR 7 DAYS THEN 1 TABLET DAILY Patient  taking differently: Take 20 mEq by mouth daily. 03/09/19   Henson, Vickie L, NP-C  valACYclovir (VALTREX) 1000 MG tablet Take 1 tablet (1,000 mg total) by mouth 2 (two) times daily as needed. X 5 days during an outbreak Patient taking differently: Take 1,000 mg by mouth 2 (two) times daily as needed (for 5 days during an outbreak). 08/15/18   Henson, Vickie L, NP-C  venlafaxine XR (EFFEXOR-XR) 150 MG 24 hr capsule Take total of 225 mg daily (150 mg + 75 mg daily) 08/18/20   Norman Clay, MD  venlafaxine XR (EFFEXOR-XR) 75 MG 24 hr capsule 225 mg daily (150 mg + 75 mg daily) 08/18/20   Norman Clay, MD    Allergies    Shellfish allergy, Bactrim [sulfamethoxazole-trimethoprim], and Chocolate flavor  Review of Systems  Review of Systems  Constitutional: Negative for fever.  Gastrointestinal: Negative for abdominal pain.  Genitourinary: Positive for hematuria. Negative for dysuria and flank pain.  Musculoskeletal: Negative for myalgias.  Neurological: Negative for weakness.    Physical Exam Updated Vital Signs BP (!) 153/96 (BP Location: Right Arm)   Pulse 77   Temp 97.9 F (36.6 C) (Oral)   Resp 16   LMP 06/11/2013   SpO2 99%   Physical Exam Constitutional:      Appearance: She is well-developed and well-nourished.  Pulmonary:     Effort: Pulmonary effort is normal.  Abdominal:     Tenderness: There is no abdominal tenderness.  Musculoskeletal:     Cervical back: Normal range of motion.  Skin:    General: Skin is warm and dry.  Neurological:     Mental Status: She is alert and oriented to person, place, and time.     ED Results / Procedures / Treatments   Labs (all labs ordered are listed, but only abnormal results are displayed) Labs Reviewed - No data to display  EKG None  Radiology No results found.  Procedures Procedures   Medications Ordered in ED Medications  cephALEXin (KEFLEX) capsule 500 mg (has no administration in time range)    ED Course  I  have reviewed the triage vital signs and the nursing notes.  Pertinent labs & imaging results that were available during my care of the patient were reviewed by me and considered in my medical decision making (see chart for details).    MDM Rules/Calculators/A&P                          Patient to ED for evaluation of blood in her urine. Chart reviewed. UA and urine culture done on 2/21 where culture is positive for E. Coli. She reports she received a phone call regarding "bacteria in my urine" but did not pursue treatment.   Will start on antibiotics. She does not currently have a primary care provider. Will refer to [facility].   Final Clinical Impression(s) / ED Diagnoses Final diagnoses:  None   1. E. Coli UTI  Rx / DC Orders ED Discharge Orders    None       Charlann Lange, [name] 01/05/21 0517    Lajean Saver, MD 01/05/21 [phone]

## 2021-01-05 NOTE — ED Triage Notes (Signed)
Pt sts hematuria since 2/21 encounter. Denies pain. Sts she is passing large clots.

## 2021-01-05 NOTE — Discharge Instructions (Addendum)
Take the antibiotic as prescribed. If you develop any significant pain, high fever or new concern, please return to the emergency department for further evaluation.

## 2021-01-16 ENCOUNTER — Telehealth: Payer: Self-pay | Admitting: Psychiatry

## 2021-01-16 ENCOUNTER — Other Ambulatory Visit: Payer: Self-pay

## 2021-01-16 NOTE — Telephone Encounter (Signed)
Sent link for video visit through Epic. Patient did not sign in. Called the patient  for appointment scheduled today. The patient did not answer the phone. Left voice message to contact the office.  

## 2021-05-02 IMAGING — XA IR US GUIDE VASC ACCESS RIGHT
5 series · 14 of 18 positions shown · non-contrast
Comparison: none

INDICATION: Hepatic steatosis, possible cirrhosis and chronic methotrexate
therapy for rheumatoid arthritis. Request for transjugular liver
biopsy with pressure measurements.

[Series 4: care body 4 · 2 of 18 frames shown (1 of 3)]
[frame 3/18]
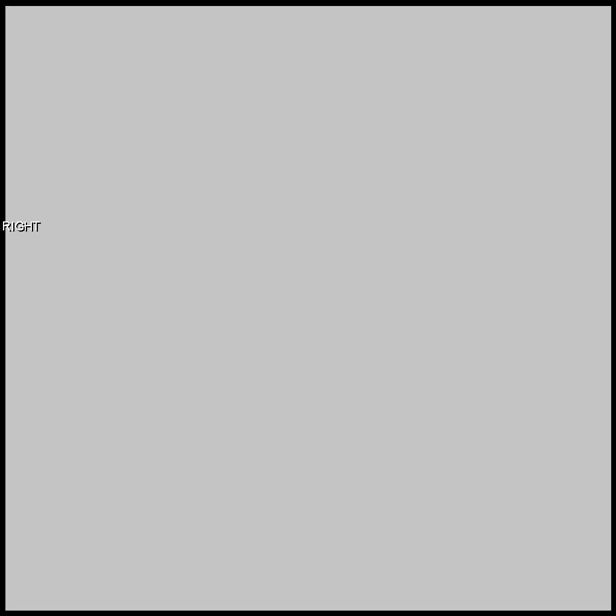
[frame 10/18]
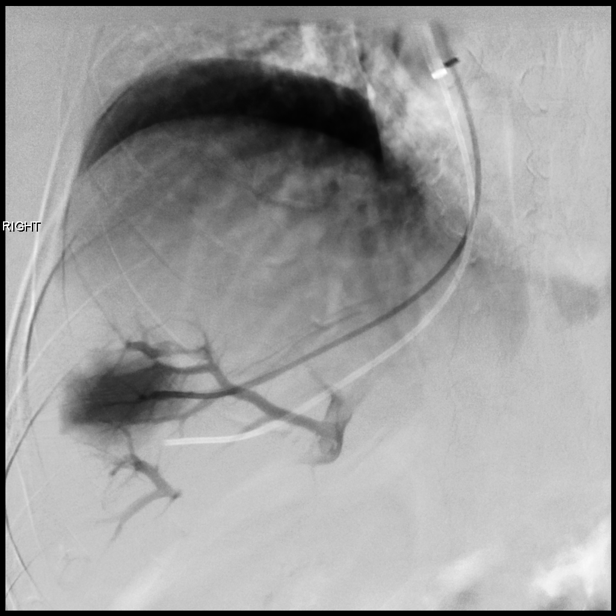

[Series 5: care body 4 · 3 of 36 frames shown (2 of 3)]
[frame 6/36]
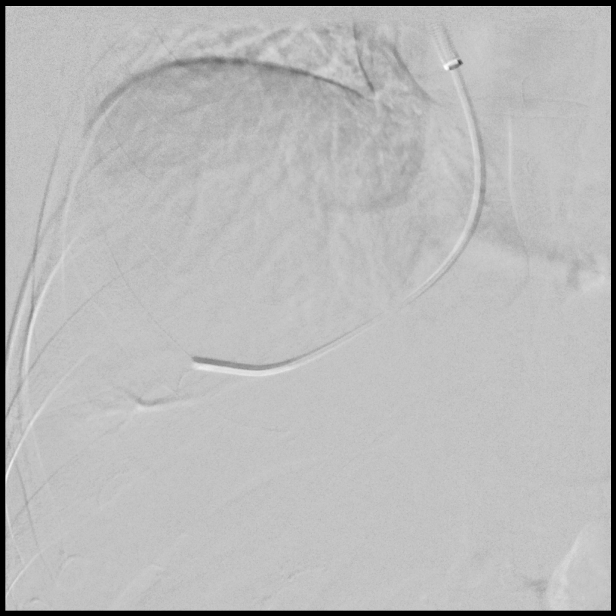
[frame 19/36]
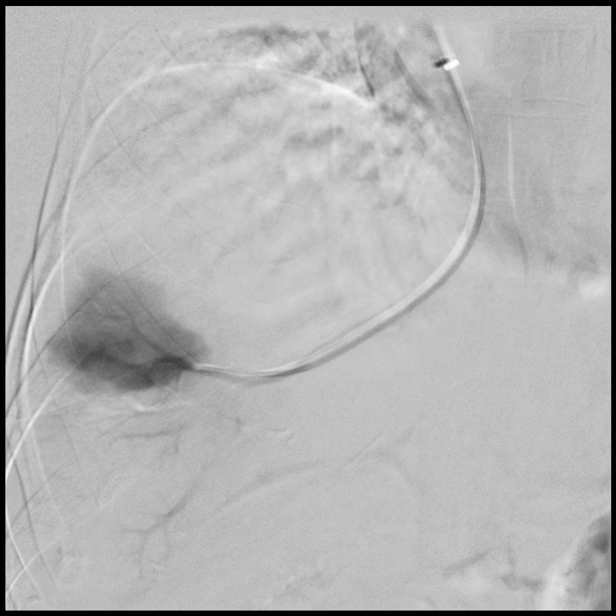
[frame 30/36]
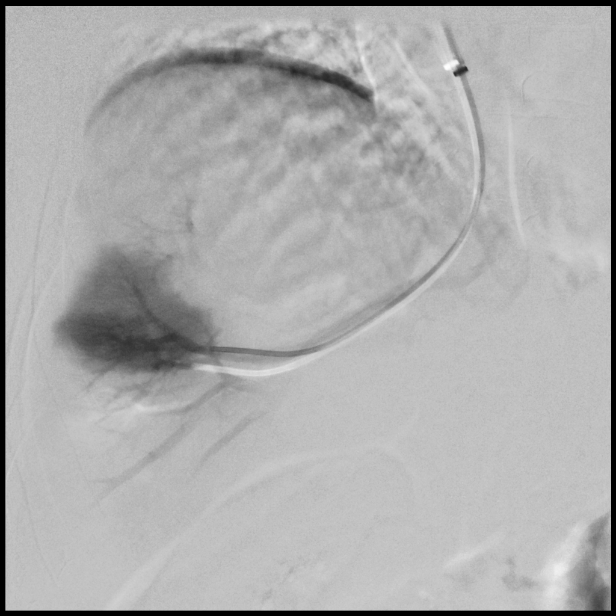

[Series 6: fl - angio · 4 of 347 frames shown (1 of 2)]
[frame 53/347]
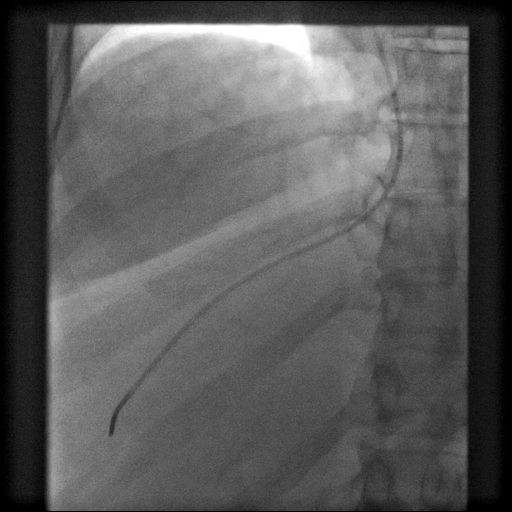
[frame 174/347]
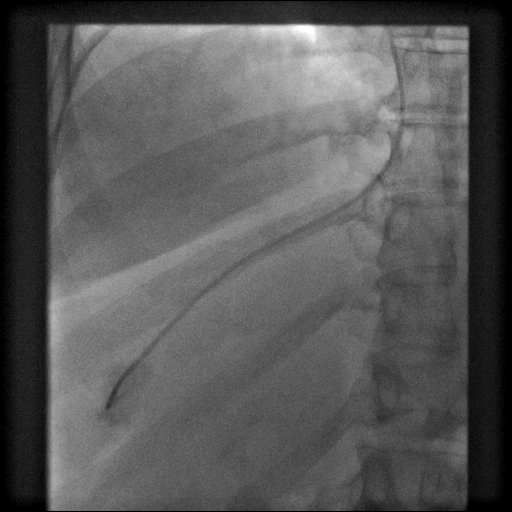
[frame 272/347]
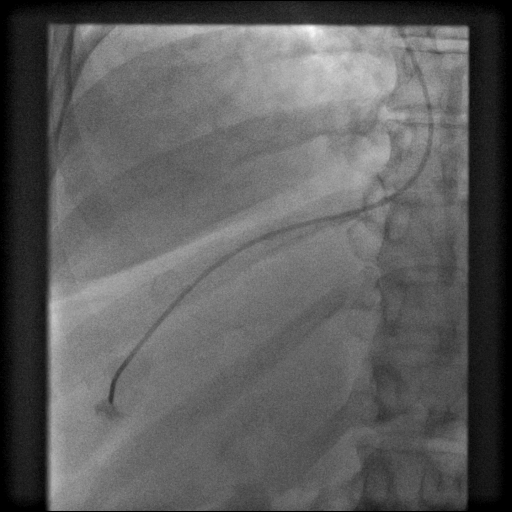
[frame 295/347]
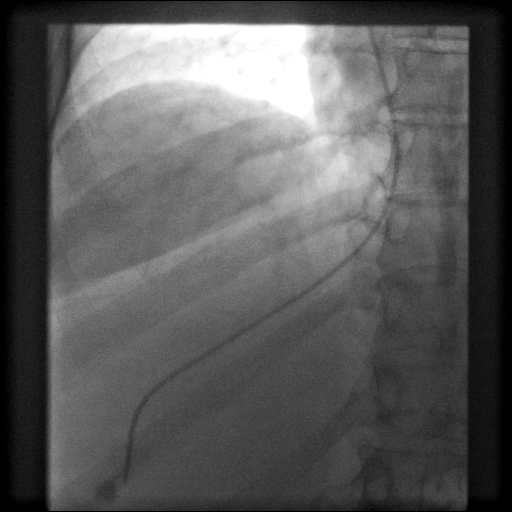

[Series 7: fl - angio · 2 of 189 frames shown (2 of 2)]
[frame 95/189]
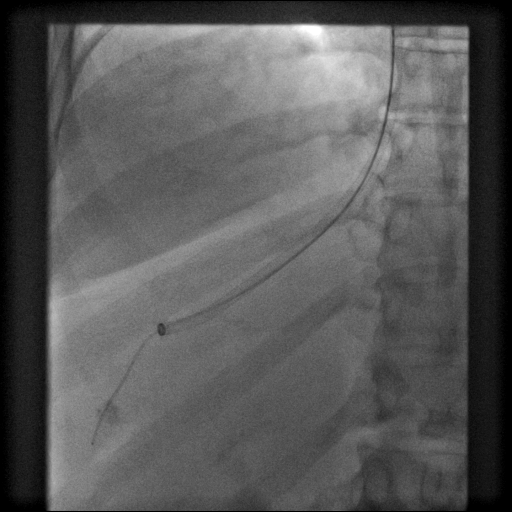
[frame 161/189]
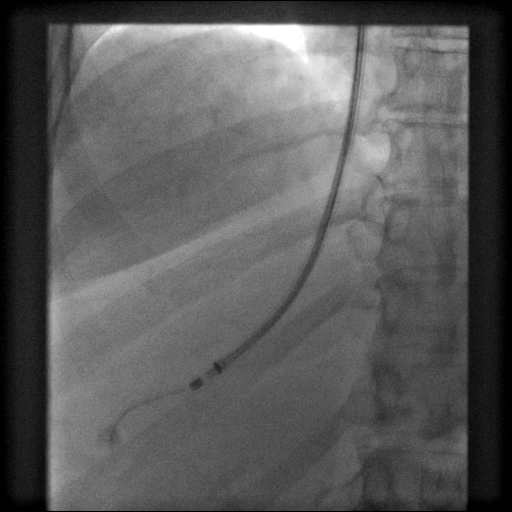

[Series 11: care body 4 · 3 of 13 frames shown (3 of 3)]
[frame 2/13]
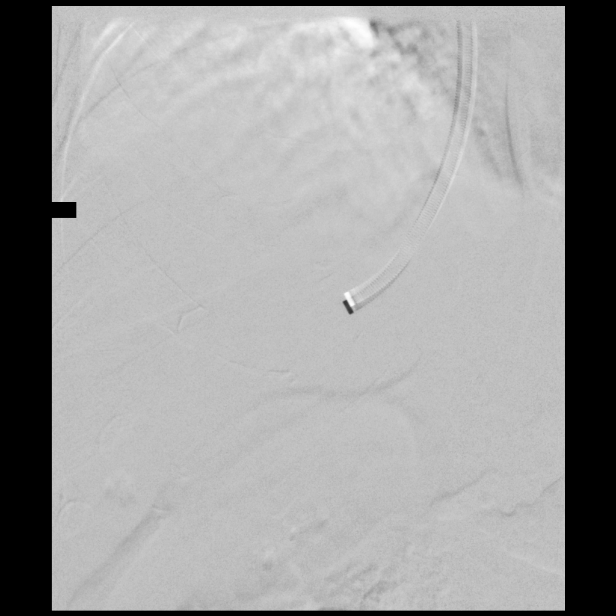
[frame 10/13]
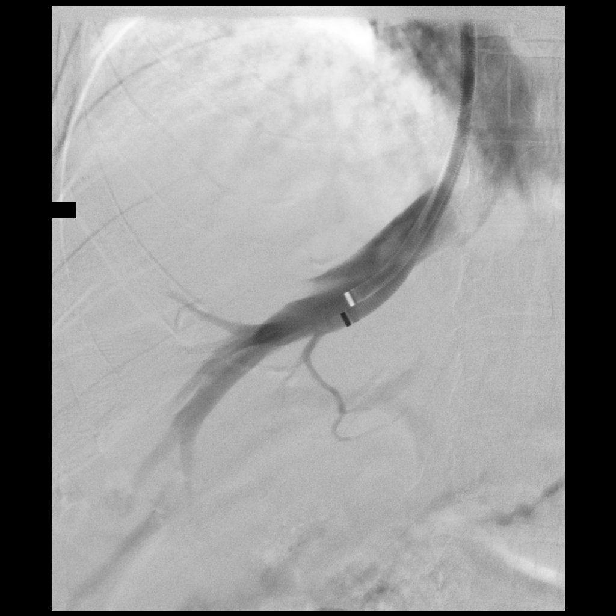
[frame 12/13]
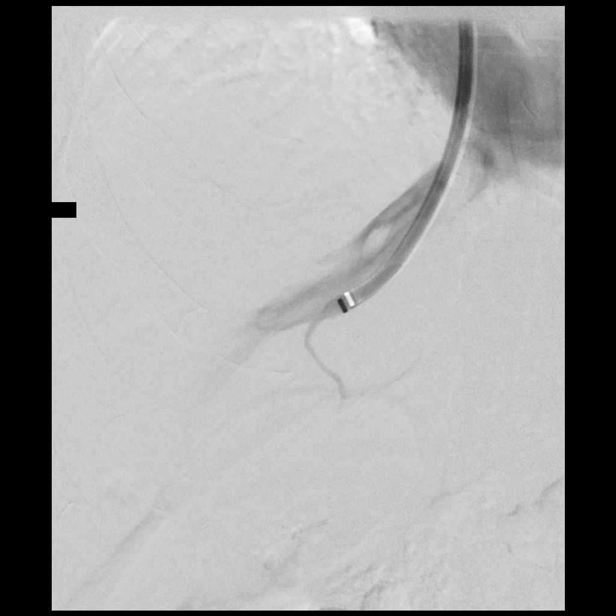

[14 of 18 positions shown; findings below may reference images not displayed]

EXAM:
1. ULTRASOUND GUIDANCE FOR VASCULAR ACCESS OF THE RIGHT INTERNAL
JUGULAR VEIN
2. HEPATIC VENOGRAPHY AND HEMODYNAMIC PRESSURE MEASUREMENTS
3. TRANSCATHETER BIOPSY OF THE LIVER

MEDICATIONS:
None.

ANESTHESIA/SEDATION:
Moderate (conscious) sedation was employed during this procedure. A
total of Versed 1.5 mg and Fentanyl 50 mcg was administered
intravenously.

Moderate Sedation Time: 41 minutes. The patient's level of
consciousness and vital signs were monitored continuously by
radiology nursing throughout the procedure under my direct
supervision.

FLUOROSCOPY TIME:  Fluoroscopy Time: 11 minutes and 18 seconds.
384.2 mGy.

COMPLICATIONS:
None immediate.

PROCEDURE:
Informed written consent was obtained from the patient after a
thorough discussion of the procedural risks, benefits and
alternatives. All questions were addressed. Maximal Sterile Barrier
Technique was utilized including caps, mask, sterile gowns, sterile
gloves, sterile drape, hand hygiene and skin antiseptic. A timeout
was performed prior to the initiation of the procedure.

Ultrasound was performed of the neck to confirm patency of the right
internal jugular vein. Under direct ultrasound guidance, access of
the right internal jugular vein was performed with a 21 gauge needle
and micropuncture set. After establishing access, a 6 French sheath
was placed over a guidewire. A 5 French catheter was advanced
ultimately into the inferior vena cava. Venous access was dilated
over a wire and a 10 French angled sheath advanced into the IVC.
Catheterization both right and middle hepatic veins were performed
under fluoroscopy with a 5 French catheter.

Hepatic venography was performed at the level the middle hepatic
vein via the 5 French catheter. This included a wedged hepatic
venogram in order to reflux contrast into the portal venous system.
Pressure measurements were obtained via the catheter in the hepatic
vein as well as in a wedged position within the periphery of the
liver parenchyma.

The 10 French sheath was advanced into the middle hepatic vein. A
coaxial 7 French transjugular biopsy needle sheath was advanced
through the 10 French sheath into the middle hepatic vein. Coaxial
19 gauge core biopsy was then performed within the right lobe of the
liver with a transjugular core biopsy device. Three separate samples
were obtained and submitted in formalin. The needle sheath was
removed and additional venography performed through the 10 French
sheath. The 10 French sheath was then removed and hemostasis
obtained with manual compression.
FINDINGS: Initial venography demonstrates normal patency of right and middle
hepatic veins. Measure pressure in the middle hepatic vein is [DATE]
(7) mm Hg. A wedged venogram demonstrated parenchymal blush with
reflux into portal vein branches. Wedged venous pressure was [DATE]
(9) mm Hg. There is no evidence by wedged pressure measurement of
portal hypertension.

Solid tissue samples were obtained via transcatheter biopsy through
the middle hepatic vein. Completion venography shows no
extravasation of contrast.
IMPRESSION: 1. Pressure measurements demonstrate no elevation of wedged hepatic
vein pressures to indicate evidence of portal hypertension.
2. Transcatheter biopsy of the liver was performed through the
middle hepatic vein.

## 2021-05-14 ENCOUNTER — Other Ambulatory Visit: Payer: Self-pay

## 2021-05-14 ENCOUNTER — Emergency Department (HOSPITAL_COMMUNITY)
Admission: EM | Admit: 2021-05-14 | Discharge: 2021-05-14 | Disposition: A | Discharge status: Home / Self Care | Payer: Self-pay | Attending: Emergency Medicine | Admitting: Emergency Medicine

## 2021-05-14 ENCOUNTER — Emergency Department (HOSPITAL_COMMUNITY): Payer: Self-pay

## 2021-05-14 ENCOUNTER — Encounter (HOSPITAL_COMMUNITY): Payer: Self-pay | Admitting: Emergency Medicine

## 2021-05-14 DIAGNOSIS — Z79899 Other long term (current) drug therapy: Secondary | ICD-10-CM | POA: Insufficient documentation

## 2021-05-14 DIAGNOSIS — I10 Essential (primary) hypertension: Secondary | ICD-10-CM | POA: Insufficient documentation

## 2021-05-14 DIAGNOSIS — Z853 Personal history of malignant neoplasm of breast: Secondary | ICD-10-CM | POA: Insufficient documentation

## 2021-05-14 DIAGNOSIS — J209 Acute bronchitis, unspecified: Secondary | ICD-10-CM | POA: Insufficient documentation

## 2021-05-14 MED ORDER — ALBUTEROL SULFATE (2.5 MG/3ML) 0.083% IN NEBU
5.0000 mg | INHALATION_SOLUTION | Freq: Once | RESPIRATORY_TRACT | Status: AC
  Administered 2021-05-14: 5 mg via RESPIRATORY_TRACT
  Filled 2021-05-14: qty 6

## 2021-05-14 MED ORDER — PREDNISONE 20 MG PO TABS: 20.0000 mg | ORAL_TABLET | Freq: Two times a day (BID) | ORAL | 0 refills | Status: DC

## 2021-05-14 MED ORDER — AMOXICILLIN-POT CLAVULANATE 875-125 MG PO TABS
1.0000 | ORAL_TABLET | Freq: Once | ORAL | Status: AC
  Administered 2021-05-14: 1 via ORAL
  Filled 2021-05-14: qty 1

## 2021-05-14 MED ORDER — AMOXICILLIN-POT CLAVULANATE 875-125 MG PO TABS: 1.0000 | ORAL_TABLET | Freq: Two times a day (BID) | ORAL | 0 refills | Status: DC

## 2021-05-14 MED ORDER — ALBUTEROL SULFATE HFA 108 (90 BASE) MCG/ACT IN AERS
2.0000 | INHALATION_SPRAY | Freq: Once | RESPIRATORY_TRACT | Status: AC
  Administered 2021-05-14: 2 via RESPIRATORY_TRACT
  Filled 2021-05-14: qty 6.7

## 2021-05-14 MED ORDER — IPRATROPIUM BROMIDE 0.02 % IN SOLN
0.5000 mg | Freq: Once | RESPIRATORY_TRACT | Status: AC
  Administered 2021-05-14: 0.5 mg via RESPIRATORY_TRACT
  Filled 2021-05-14: qty 2.5

## 2021-05-14 MED ORDER — PREDNISONE 20 MG PO TABS
60.0000 mg | ORAL_TABLET | Freq: Once | ORAL | Status: AC
  Administered 2021-05-14: 60 mg via ORAL
  Filled 2021-05-14: qty 3

## 2021-05-14 NOTE — ED Provider Notes (Signed)
Emergency Medicine Provider Triage Evaluation Note  Joanne Garrett , a 45 y.o. female  was evaluated in triage.  Pt complains of cough, SOB, COVID/flu negative 1 week ago. States since then has been having more issues with SOB. Worse with exertion. No NVD.  No fevers.   No asthma/COPD hx.   Review of Systems  Positive: SOB, cough Negative: fever  Physical Exam  BP (!) 140/110 (BP Location: Right Arm)   Pulse 92   Temp 98 F (36.7 C) (Oral)   Resp 18   Ht 5\' 3"  (1.6 m)   Wt 95.3 kg   LMP 06/11/2013   SpO2 100%   BMI 37.20 kg/m  Gen:   Awake, no distress   Resp:  Normal effort but coughing fits occasionally. Speaking in short but complete sentences.  MSK:   Moves extremities without difficulty  Other:    Medical Decision Making  Medically screening exam initiated at 2:31 PM.  Appropriate orders placed.  Joanne Garrett was informed that the remainder of the evaluation will be completed by another provider, this initial triage assessment does not replace that evaluation, and the importance of remaining in the ED until their evaluation is complete.  Will note that with her SOB she should be prioritized for room placement.    Joanne Garrett, [place] 05/14/21 1435    Daleen Bo, MD 05/14/21 2237

## 2021-05-14 NOTE — ED Triage Notes (Signed)
Patient complains of SOB and productive cough with yellow sputum. Denies fevers, N/V/D. Without audible wheezing or stridor, appears short-winded when answering questions.

## 2021-05-14 NOTE — ED Provider Notes (Signed)
[facility] DEPT Provider Note   CSN: [id number] Arrival date & time: 05/14/21  1410     History No chief complaint on file.   Joanne Garrett is a 45 y.o. female.  HPI She complains of illness for 1 week characterized by cough productive of yellow sputum, and shortness of breath with talking and exertion.  She does not smoke cigarettes.  She has had full COVID vaccines and boosters.  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  She has been able to take her usual medications.  She denies vomiting, dizziness or weakness.  There are no other known modifying factors.    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 and completed radiation 09-30-2012 and completed anti-estrogen therapy   Depression    Genital herpes    Hematuria    History of hyperthyroidism 02/2001   s/p  RAI 03-2001   History of panic attacks    History of radiation therapy    completed 09-23-2012  50Gy in 25 fractions and boost completed [phone]   History of thyroid nodule    Hypertension    Renal calculus, right    Rheumatoid arthritis ([place])    Trigeminal neuralgia of right side of face 04/15/2019   V2   Urgency of urination     Patient Active Problem List   Diagnosis Date Noted   Weakness 05/31/2020   Snoring 05/20/2019   Insomnia 05/20/2019   Trigeminal neuralgia of right side of face 04/15/2019   MDD (major depressive disorder), recurrent, 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   Obesity (BMI 30.0-34.9) 01/31/2018   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   Rheumatoid arthritis ([place])    PTSD (post-traumatic stress disorder) 09/17/2017   MDD (major depressive disorder), recurrent episode, moderate ([place]) 09/17/2017   HTN (hypertension) 01/16/2017   Genital herpes 01/16/2017   History of thyroid disorder 01/16/2017   Left thyroid nodule  01/16/2017   Breast pain, left 01/16/2017   History of breast cancer 01/16/2017    Past Surgical History:  Procedure Laterality Date   ABDOMINAL HYSTERECTOMY     ANTERIOR CERVICAL DECOMP/DISCECTOMY FUSION  05-18-2015   [facility]   BREAST LUMPECTOMY Left 07-03-2012    [facility]   CYSTOSCOPY WITH RETROGRADE PYELOGRAM, URETEROSCOPY AND STENT PLACEMENT Right 09/09/2017   Procedure: CYSTOSCOPY WITH RIGHT RETROGRADE PYELOGRAM, URETEROSCOPY , STONE BASKETRY AND STENT PLACEMENT;  Surgeon: Lucas Mallow, MD;  Location: [facility];  Service: Urology;  Laterality: Right;   IR TRANSCATHETER BX  02/02/2020   IR US GUIDE VASC ACCESS RIGHT  02/02/2020   IR VENOGRAM HEPATIC W HEMODYNAMIC EVALUATION  02/02/2020   KNEE ARTHROSCOPY Left 2009   TOTAL ABDOMINAL HYSTERECTOMY W/ BILATERAL SALPINGOOPHORECTOMY  2014     OB History   No obstetric history on file.     Family History  Problem Relation Age of Onset   Hypertension Mother    COPD Mother    Depression Mother    Hypertension Father    Kidney disease Father    Alcohol abuse Father    Hypertension Brother    Alzheimer's disease Paternal Grandmother     Social History   Tobacco Use   Smoking status: Never   Smokeless tobacco: Never  Vaping Use   Vaping Use: Never used  Substance Use Topics   Alcohol use:  No   Drug use: No    Home Medications Prior to Admission medications   Medication Sig Start Date End Date Taking? Authorizing Provider  amoxicillin-clavulanate (AUGMENTIN) 875-125 MG tablet Take 1 tablet by mouth 2 (two) times daily. One po bid x 7 days 05/14/21  Yes Daleen Bo, MD  predniSONE (DELTASONE) 20 MG tablet Take 1 tablet (20 mg total) by mouth 2 (two) times daily. 05/14/21  Yes Daleen Bo, MD  amLODipine (NORVASC) 10 MG tablet TAKE 1 TABLET BY MOUTH DAILY Patient taking differently: Take 10 mg by mouth daily. 01/02/19   Henson, Vickie L, NP-C  amLODipine (NORVASC) 10 MG tablet Take 1 tablet (10 mg total) by  mouth daily. 12/26/20   Charlesetta Shanks, MD  carbamazepine (TEGRETOL) 200 MG tablet Take 1 tablet (200 mg total) by mouth 2 (two) times daily. Patient taking differently: Take 200 mg by mouth 2 (two) times daily as needed. 05/31/20   Suzzanne Cloud, NP  cephALEXin (KEFLEX) 500 MG capsule Take 1 capsule (500 mg total) by mouth 4 (four) times daily. 01/05/21   Charlann Lange, [name]  folic acid (FOLVITE) 1 MG tablet Take 1 mg by mouth daily. 10/20/18   [provider]  gabapentin (NEURONTIN) 600 MG tablet Take 1 tablet (600 mg total) by mouth 2 (two) times daily. Patient taking differently: Take 600 mg by mouth 2 (two) times daily as needed. 04/15/19   Kathrynn Ducking, MD  gabapentin (NEURONTIN) 600 MG tablet Take 1 tablet (600 mg total) by mouth 2 (two) times daily. 12/26/20   Charlesetta Shanks, MD  golimumab (SIMPONI ARIA) 50 MG/4ML SOLN injection Inject 50 mg into the vein every 30 (thirty) days.  03/03/19   [provider]  LORazepam (ATIVAN) 0.5 MG tablet 0.25-0.5 mg daily as needed for anxiety 10/17/20   Norman Clay, MD  meclizine (ANTIVERT) 25 MG tablet Take 1 tablet (25 mg total) by mouth 2 (two) times daily as needed for dizziness. 08/25/18   Henson, Vickie L, NP-C  meloxicam (MOBIC) 15 MG tablet Take 15 mg by mouth daily as needed for pain. 05/27/20   [provider]  methocarbamol (ROBAXIN) 500 MG tablet Take 500 mg by mouth 3 (three) times daily as needed for muscle spasms. 07/22/20   [provider]  methocarbamol (ROBAXIN) 500 MG tablet Take 1 tablet (500 mg total) by mouth every 6 (six) hours as needed for muscle spasms. 12/26/20   Charlesetta Shanks, MD  Methotrexate Sodium (METHOTREXATE, PF,) 50 MG/2ML injection Inject 0.8 mLs into the muscle once a week. 12/14/20   [provider]  methylPREDNISolone (MEDROL DOSEPAK) 4 MG TBPK tablet Per pack instruction 12/26/20   Charlesetta Shanks, MD  Multiple Vitamin (MULTIVITAMIN) tablet Take 1 tablet by mouth daily.     [provider]  ondansetron (ZOFRAN ODT) 4 MG disintegrating tablet Take 1 tablet (4 mg total) by mouth every 8 (eight) hours as needed for nausea or vomiting. 08/15/20   Alfredia Client, [name]  potassium chloride SA (K-DUR) 20 MEQ tablet TAKE 2 TABLETS BY MOUTH FOR 7 DAYS THEN 1 TABLET DAILY Patient taking differently: Take 20 mEq by mouth daily. 03/09/19   Henson, Vickie L, NP-C  valACYclovir (VALTREX) 1000 MG tablet Take 1 tablet (1,000 mg total) by mouth 2 (two) times daily as needed. X 5 days during an outbreak Patient taking differently: Take 1,000 mg by mouth 2 (two) times daily as needed (for 5 days during an outbreak). 08/15/18   Henson, Laurian Brim,  NP-C  venlafaxine XR (EFFEXOR-XR) 150 MG 24 hr capsule Take total of 225 mg daily (150 mg + 75 mg daily) 08/18/20   Norman Clay, MD  venlafaxine XR (EFFEXOR-XR) 75 MG 24 hr capsule 225 mg daily (150 mg + 75 mg daily) 08/18/20   Norman Clay, MD    Allergies    Shellfish allergy, Bactrim [sulfamethoxazole-trimethoprim], and Chocolate flavor  Review of Systems   Review of Systems  All other systems reviewed and are negative.  Physical Exam Updated Vital Signs BP (!) 163/116   Pulse 98   Temp 98 F (36.7 C) (Oral)   Resp 18   Ht 5\' 3"  (1.6 m)   Wt 95.3 kg   LMP 06/11/2013   SpO2 98%   BMI 37.20 kg/m   Physical Exam Vitals and nursing note reviewed.  Constitutional:      General: She is not in acute distress.    Appearance: She is well-developed. She is not ill-appearing, toxic-appearing or diaphoretic.  HENT:     Head: Normocephalic and atraumatic.  Eyes:     Conjunctiva/sclera: Conjunctivae normal.     Pupils: Pupils are equal, round, and reactive to light.  Neck:     Trachea: Phonation normal.  Cardiovascular:     Rate and Rhythm: Normal rate and regular rhythm.  Pulmonary:     Effort: Pulmonary effort is normal. No respiratory distress.     Breath sounds: No stridor.     Comments: Decreased air movement with  scattered rhonchi and wheezes.  No increased work of breathing. Chest:     Chest wall: No tenderness.  Abdominal:     General: There is no distension.     Palpations: Abdomen is soft.     Tenderness: There is no abdominal tenderness. There is no guarding.  Musculoskeletal:        General: Normal range of motion.     Cervical back: Normal range of motion and neck supple.  Skin:    General: Skin is warm and dry.     Coloration: Skin is not jaundiced or pale.  Neurological:     Mental Status: She is alert and oriented to person, place, and time.     Motor: No abnormal muscle tone.  Psychiatric:        Mood and Affect: Mood normal.        Behavior: Behavior normal.        Thought Content: Thought content normal.        Judgment: Judgment normal.    ED Results / Procedures / Treatments   Labs (all labs ordered are listed, but only abnormal results are displayed) Labs Reviewed - No data to display  EKG EKG Interpretation  Date/Time:  Sunday May 14 2021 14:56:03 EDT Ventricular Rate:  91 PR Interval:  142 QRS Duration: 84 QT Interval:  388 QTC Calculation: 477 R Axis:   34 Text Interpretation: Normal sinus rhythm Possible Anterior infarct , age undetermined Abnormal ECG since last tracing no significant change Confirmed by Daleen Bo [phone]) on 05/14/2021 3:05:03 PM  Radiology DG Chest 2 View  Result Date: 05/14/2021 CLINICAL DATA:  Shortness of breath and cough EXAM: CHEST - 2 VIEW COMPARISON:  August 26, 2019 FINDINGS: The heart size and mediastinal contours are within normal limits. Eventration of the right hemidiaphragm. Streaky right lower lobe opacities. No pleural effusion. No pneumothorax. Cervical fusion hardware. IMPRESSION: Streaky right lower lobe opacities favor atelectasis although developing infiltrate not excluded. Electronically Signed   By:  Dahlia Bailiff MD   On: 05/14/2021 15:26    Procedures Procedures   Medications Ordered in ED Medications   predniSONE (DELTASONE) tablet 60 mg (60 mg Oral Given 05/14/21 1738)  albuterol (VENTOLIN HFA) 108 (90 Base) MCG/ACT inhaler 2 puff (2 puffs Inhalation Given 05/14/21 1739)  amoxicillin-clavulanate (AUGMENTIN) 875-125 MG per tablet 1 tablet (1 tablet Oral Given 05/14/21 1738)  albuterol (PROVENTIL) (2.5 MG/3ML) 0.083% nebulizer solution 5 mg (5 mg Nebulization Given 05/14/21 2048)  ipratropium (ATROVENT) nebulizer solution 0.5 mg (0.5 mg Nebulization Given 05/14/21 2049)    ED Course  I have reviewed the triage vital signs and the nursing notes.  Pertinent labs & imaging results that were available during my care of the patient were reviewed by me and considered in my medical decision making (see chart for details).  Clinical Course as of 05/14/21 2215  Sun May 14, 2021  2024 She continues to have a high-pitched squeaking voice, but is lying semirecumbent without apparent respiratory distress.  On auscultation she has somewhat improved air movement but coughs with deep breathing and has a few scattered wheezes. [EW]    Clinical Course User Index [EW] Daleen Bo, MD   MDM Rules/Calculators/A&P                           Patient Vitals for the past 24 hrs:  BP Temp Temp src Pulse Resp SpO2 Height Weight  05/14/21 2100 (!) 163/116 -- -- 98 18 98 % -- --  05/14/21 1900 (!) 164/103 -- -- 93 16 98 % -- --  05/14/21 1830 (!) 174/109 -- -- 83 18 100 % -- --  05/14/21 1730 (!) 152/87 -- -- 77 18 100 % -- --  05/14/21 1658 (!) 155/113 -- -- 79 (!) 22 100 % -- --  05/14/21 1422 -- -- -- -- -- -- 5\' 3"  (1.6 m) 95.3 kg  05/14/21 1417 (!) 140/110 98 F (36.7 C) Oral 92 18 100 % -- --    10:15 PM Reevaluation with update and discussion. After initial assessment and treatment, an updated evaluation reveals she is more comfortable now breathing better with no abnormal tone to her voice at this time.  Findings discussed and questions answered. Daleen Bo   Medical Decision Making:  This  patient is presenting for evaluation of cough with sputum production, which does require a range of treatment options, and is a complaint that involves a moderate risk of morbidity and mortality. The differential diagnoses include pneumonia, bronchitis, asthma. I decided to review old records, and in summary middle-aged female with history of arthritis, medications, not a smoker, presenting with infectious symptoms.  I did not require additional historical information from anyone.  Radiologic Tests Ordered, included chest x-ray.  I independently Visualized: Radiographic images, which show right lower lobe atelectasis, versus early infiltrate   Critical Interventions-clinical evaluation, radiography, medication treatment, observation and reassessment  After These Interventions, the Patient was reevaluated and was found improved and feeling better, ready for discharge.  Vital signs reassuring.  Patient with incidental high blood pressure, currently being treated with amlodipine.  Doubt hypertensive urgency.  Doubt pneumonia, heart failure, metabolic disorder or impending vascular collapse.  CRITICAL CARE-no Performed by: Daleen Bo  Nursing Notes Reviewed/ Care Coordinated Applicable Imaging Reviewed Interpretation of Laboratory Data incorporated into ED treatment  The patient appears reasonably screened and/or stabilized for discharge and I doubt any other medical condition or other [facility] requiring further screening,  evaluation, or treatment in the ED at this time prior to discharge.  Plan: Home Medications-continue usual medication; Home Treatments-gradual advance diet and activity; return here if the recommended treatment, does not improve the symptoms; Recommended follow up-PCP as needed and for blood pressure check in 1 to 2 weeks.     Final Clinical Impression(s) / ED Diagnoses Final diagnoses:  Acute bronchitis, unspecified organism  Hypertension, unspecified type    Rx / DC  Orders ED Discharge Orders          Ordered    predniSONE (DELTASONE) 20 MG tablet  2 times daily        05/14/21 2210    amoxicillin-clavulanate (AUGMENTIN) 875-125 MG tablet  2 times daily        05/14/21 2210             Daleen Bo, MD 05/14/21 2215

## 2021-05-14 NOTE — Discharge Instructions (Addendum)
Use the albuterol inhaler 4 puffs every 2-3 hours as needed for cough or trouble breathing.  Get the prescriptions filled and start taking them as soon as possible.  Your blood pressure is elevated.  Make sure you are taking your blood pressure medicine as prescribed.  Follow-up with your doctor if not better in 2 or 3 days.  Follow-up with your PCP for blood pressure check in 1 to 2 weeks.

## 2021-08-31 ENCOUNTER — Telehealth: Payer: Self-pay | Admitting: Neurology

## 2021-08-31 NOTE — Telephone Encounter (Signed)
Pt was called to offer a f/u appointment with Dr Jaynee Eagles.  Phone rep left vm asking pt to call so an appointment could be scheduled for when she has insurance. This is Pharmacist, hospital

## 2021-08-31 NOTE — Telephone Encounter (Signed)
Due to no insurance pt has cx her appointment for 10-28, she would like a call re: who Dr Jannifer Franklin will refer her to once he retires.

## 2021-09-01 ENCOUNTER — Ambulatory Visit: Payer: Self-pay | Admitting: Neurology

## 2021-09-05 ENCOUNTER — Telehealth: Payer: Self-pay | Admitting: Neurology

## 2021-09-05 NOTE — Telephone Encounter (Signed)
Pt called and scheduled f/u appt.and requested for a refill request for her gabapentin (NEURONTIN) 600 MG tablet to the [facility] on [address]

## 2021-09-06 ENCOUNTER — Other Ambulatory Visit: Payer: Self-pay | Admitting: Neurology

## 2021-09-06 MED ORDER — GABAPENTIN 600 MG PO TABS
600.0000 mg | ORAL_TABLET | Freq: Two times a day (BID) | ORAL | 0 refills | Status: DC
Start: 2021-09-06 — End: 2021-11-15

## 2021-09-06 NOTE — Telephone Encounter (Signed)
Refill sent for the patient to the pharmacy as requested. Must keep the upcoming apt in Jan 2023

## 2021-09-24 ENCOUNTER — Other Ambulatory Visit: Payer: Self-pay

## 2021-09-24 ENCOUNTER — Emergency Department (HOSPITAL_COMMUNITY)
Admission: EM | Admit: 2021-09-24 | Discharge: 2021-09-24 | Disposition: A | Discharge status: Home / Self Care | Payer: Self-pay | Attending: Emergency Medicine | Admitting: Emergency Medicine

## 2021-09-24 ENCOUNTER — Encounter (HOSPITAL_COMMUNITY): Payer: Self-pay | Admitting: Emergency Medicine

## 2021-09-24 ENCOUNTER — Emergency Department (HOSPITAL_COMMUNITY): Payer: Self-pay

## 2021-09-24 DIAGNOSIS — Z79899 Other long term (current) drug therapy: Secondary | ICD-10-CM | POA: Insufficient documentation

## 2021-09-24 DIAGNOSIS — M1990 Unspecified osteoarthritis, unspecified site: Secondary | ICD-10-CM | POA: Insufficient documentation

## 2021-09-24 DIAGNOSIS — M25562 Pain in left knee: Secondary | ICD-10-CM | POA: Insufficient documentation

## 2021-09-24 DIAGNOSIS — M25561 Pain in right knee: Secondary | ICD-10-CM | POA: Insufficient documentation

## 2021-09-24 DIAGNOSIS — I10 Essential (primary) hypertension: Secondary | ICD-10-CM | POA: Insufficient documentation

## 2021-09-24 DIAGNOSIS — Z853 Personal history of malignant neoplasm of breast: Secondary | ICD-10-CM | POA: Insufficient documentation

## 2021-09-24 MED ORDER — PREDNISONE 10 MG (21) PO TBPK: ORAL_TABLET | Freq: Every day | ORAL | 0 refills | Status: DC

## 2021-09-24 NOTE — Discharge Instructions (Signed)
As we discussed continue taking your anti-inflammatories, including meloxicam as you have been along with Tylenol for pain.  Please continue to rest, ice the affected areas as needed.  You can also add a soft knee brace for extra support.  I have attached the information for the [facility] for you to establish care, and a primary care doctor so that they can see about financial assistance, and getting restarted on your infusions.  Please return if your pain significantly worsens and you cannot establish care with orthopedic doctor or your rheumatology team.

## 2021-09-24 NOTE — ED Provider Notes (Signed)
[facility] DEPT Provider Note   CSN: [id number] Arrival date & time: 09/24/21  9381     History Chief Complaint  Patient presents with   Knee Pain    Joanne Garrett is a 45 y.o. female with a past medical history significant for rheumatoid arthritis who was previously being seen and has tried both infusions, and methotrexate in the past as well as chronic anti-inflammatories, Tylenol for joint pain who presents with 1 week of severe bilateral knee pain.  Patient reports that pain is worse both in the morning, and at the end of the day, manageable in the middle of the day.  Patient is taking meloxicam, ibuprofen, Tylenol, resting, using ice with minimal relief.  Patient reports pain is 8/10 at this time.  She reports she recently lost her insurance, and has not been able to get her infusions, or be seen in her rheumatology clinic.  Patient reports typically receives steroids for flares.   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 and completed radiation 09-30-2012 and completed anti-estrogen therapy   Depression    Genital herpes    Hematuria    History of hyperthyroidism 02/2001   s/p  RAI 03-2001   History of panic attacks    History of radiation therapy    completed 09-23-2012  50Gy in 25 fractions and boost completed [phone]   History of thyroid nodule    Hypertension    Renal calculus, right    Rheumatoid arthritis ([place])    Trigeminal neuralgia of right side of face 04/15/2019   V2   Urgency of urination     Patient Active Problem List   Diagnosis Date Noted   Weakness 05/31/2020   Snoring 05/20/2019   Insomnia 05/20/2019   Trigeminal neuralgia of right side of face 04/15/2019   MDD (major depressive disorder), recurrent, in partial remission ([place]) 01/22/2019   Obesity (BMI 30.0-34.9)  01/31/2018   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   Rheumatoid arthritis ([place])    PTSD (post-traumatic stress disorder) 09/17/2017   MDD (major depressive disorder), recurrent episode, moderate ([place]) 09/17/2017   HTN (hypertension) 01/16/2017   Genital herpes 01/16/2017   History of thyroid disorder 01/16/2017   Left thyroid nodule 01/16/2017   Breast pain, left 01/16/2017   History of breast cancer 01/16/2017    Past Surgical History:  Procedure Laterality Date   ABDOMINAL HYSTERECTOMY     ANTERIOR CERVICAL DECOMP/DISCECTOMY FUSION  05-18-2015   [facility]   BREAST LUMPECTOMY Left 07-03-2012    [facility]   CYSTOSCOPY WITH RETROGRADE PYELOGRAM, URETEROSCOPY AND STENT PLACEMENT Right 09/09/2017   Procedure: CYSTOSCOPY WITH RIGHT RETROGRADE PYELOGRAM, URETEROSCOPY , STONE BASKETRY AND STENT PLACEMENT;  Surgeon: Lucas Mallow, MD;  Location: [facility];  Service: Urology;  Laterality: Right;   IR TRANSCATHETER BX  02/02/2020   IR US GUIDE VASC ACCESS RIGHT  02/02/2020   IR VENOGRAM HEPATIC W HEMODYNAMIC EVALUATION  02/02/2020   KNEE ARTHROSCOPY Left 2009   TOTAL ABDOMINAL HYSTERECTOMY W/ BILATERAL SALPINGOOPHORECTOMY  2014     OB History   No obstetric history on file.     Family History  Problem Relation Age of Onset   Hypertension Mother    COPD Mother    Depression Mother    Hypertension  Father    Kidney disease Father    Alcohol abuse Father    Hypertension Brother    Alzheimer's disease Paternal Grandmother     Social History   Tobacco Use   Smoking status: Never   Smokeless tobacco: Never  Vaping Use   Vaping Use: Never used  Substance Use Topics   Alcohol use: No   Drug use: No    Home Medications Prior to Admission medications   Medication Sig Start Date End Date Taking? Authorizing Provider  predniSONE (STERAPRED UNI-PAK 21 TAB) 10 MG (21) TBPK tablet Take by mouth daily. Take 6 tabs by mouth daily  for 2 days, then 5 tabs for 2 days,  then 4 tabs for 2 days, then 3 tabs for 2 days, 2 tabs for 2 days, then 1 tab by mouth daily for 2 days 09/24/21  Yes Molley Houser H, [name]  amLODipine (NORVASC) 10 MG tablet TAKE 1 TABLET BY MOUTH DAILY Patient taking differently: Take 10 mg by mouth daily. 01/02/19   Henson, Vickie L, [name]  amLODipine (NORVASC) 10 MG tablet Take 1 tablet (10 mg total) by mouth daily. 12/26/20   Charlesetta Shanks, MD  amoxicillin-clavulanate (AUGMENTIN) 875-125 MG tablet Take 1 tablet by mouth 2 (two) times daily. One po bid x 7 days 05/14/21   Daleen Bo, MD  carbamazepine (TEGRETOL) 200 MG tablet Take 1 tablet (200 mg total) by mouth 2 (two) times daily. Patient taking differently: Take 200 mg by mouth 2 (two) times daily as needed. 05/31/20   Suzzanne Cloud, NP  cephALEXin (KEFLEX) 500 MG capsule Take 1 capsule (500 mg total) by mouth 4 (four) times daily. 01/05/21   Charlann Lange, [name]  folic acid (FOLVITE) 1 MG tablet Take 1 mg by mouth daily. 10/20/18   [provider]  gabapentin (NEURONTIN) 600 MG tablet Take 1 tablet (600 mg total) by mouth 2 (two) times daily. 09/06/21   Suzzanne Cloud, NP  golimumab (SIMPONI ARIA) 50 MG/4ML SOLN injection Inject 50 mg into the vein every 30 (thirty) days.  03/03/19   [provider]  LORazepam (ATIVAN) 0.5 MG tablet 0.25-0.5 mg daily as needed for anxiety 10/17/20   Norman Clay, MD  meclizine (ANTIVERT) 25 MG tablet Take 1 tablet (25 mg total) by mouth 2 (two) times daily as needed for dizziness. 08/25/18   Henson, Vickie L, [name]  meloxicam (MOBIC) 15 MG tablet Take 15 mg by mouth daily as needed for pain. 05/27/20   [provider]  methocarbamol (ROBAXIN) 500 MG tablet Take 500 mg by mouth 3 (three) times daily as needed for muscle spasms. 07/22/20   [provider]  methocarbamol (ROBAXIN) 500 MG tablet Take 1 tablet (500 mg total) by mouth every 6 (six) hours as needed for muscle spasms. 12/26/20   Charlesetta Shanks, MD  Methotrexate  Sodium (METHOTREXATE, PF,) 50 MG/2ML injection Inject 0.8 mLs into the muscle once a week. 12/14/20   [provider]  methylPREDNISolone (MEDROL DOSEPAK) 4 MG TBPK tablet Per pack instruction 12/26/20   Charlesetta Shanks, MD  Multiple Vitamin (MULTIVITAMIN) tablet Take 1 tablet by mouth daily.    [provider]  ondansetron (ZOFRAN ODT) 4 MG disintegrating tablet Take 1 tablet (4 mg total) by mouth every 8 (eight) hours as needed for nausea or vomiting. 08/15/20   Alfredia Client, [name]  potassium chloride SA (K-DUR) 20 MEQ tablet TAKE 2 TABLETS BY MOUTH FOR 7 DAYS THEN 1 TABLET DAILY Patient taking differently: Take  20 mEq by mouth daily. 03/09/19   Henson, Vickie L, [name]  valACYclovir (VALTREX) 1000 MG tablet Take 1 tablet (1,000 mg total) by mouth 2 (two) times daily as needed. X 5 days during an outbreak Patient taking differently: Take 1,000 mg by mouth 2 (two) times daily as needed (for 5 days during an outbreak). 08/15/18   Henson, Vickie L, [name]  venlafaxine XR (EFFEXOR-XR) 150 MG 24 hr capsule Take total of 225 mg daily (150 mg + 75 mg daily) 08/18/20   Norman Clay, MD  venlafaxine XR (EFFEXOR-XR) 75 MG 24 hr capsule 225 mg daily (150 mg + 75 mg daily) 08/18/20   Norman Clay, MD    Allergies    Shellfish allergy, Bactrim [sulfamethoxazole-trimethoprim], and Chocolate flavor  Review of Systems   Review of Systems  Musculoskeletal:  Positive for joint swelling.  All other systems reviewed and are negative.  Physical Exam Updated Vital Signs BP (!) 155/108   Pulse (!) 103   Temp 98.8 F (37.1 C) (Oral)   Resp 18   LMP 06/11/2013   SpO2 98%   Physical Exam Vitals and nursing note reviewed.  Constitutional:      General: She is not in acute distress.    Appearance: Normal appearance.  HENT:     Head: Normocephalic and atraumatic.  Eyes:     General:        Right eye: No discharge.        Left eye: No discharge.  Cardiovascular:     Rate and Rhythm: Normal  rate and regular rhythm.     Pulses: Normal pulses.     Comments: DP, PT, popliteal pulses intact bilaterally Pulmonary:     Effort: Pulmonary effort is normal. No respiratory distress.  Musculoskeletal:        General: No deformity.     Comments: Negative ballottement test.  Negative Lachman, anterior drawer, posterior drawer, McMurray's, no varus, valgus laxity.  Intact strength bilateral lower extremities to flexion, extension at the knee.  No numbness, no tingling.  Skin:    General: Skin is warm and dry.     Capillary Refill: Capillary refill takes less than 2 seconds.  Neurological:     Mental Status: She is alert and oriented to person, place, and time.  Psychiatric:        Mood and Affect: Mood normal.        Behavior: Behavior normal.    ED Results / Procedures / Treatments   Labs (all labs ordered are listed, but only abnormal results are displayed) Labs Reviewed - No data to display  EKG None  Radiology DG Knee Complete 4 Views Left  Result Date: 09/24/2021 CLINICAL DATA:  Bilateral knee pain beginning 3 days ago with swelling. Pain anterior over the left knee. EXAM: LEFT KNEE - COMPLETE 4+ VIEW COMPARISON:  None. FINDINGS: Minimal early tricompartmental osteoarthritic change. No evidence of acute fracture or dislocation. No significant joint effusion. No focal bony abnormality. IMPRESSION: 1. No acute findings. 2. Minimal early osteoarthritic change. Electronically Signed   By: Marin Olp M.D.   On: 09/24/2021 08:26   DG Knee Complete 4 Views Right  Result Date: 09/24/2021 CLINICAL DATA:  Bilateral knee pain 3 days with swelling. Pain posterior right knee and anterior left knee. EXAM: RIGHT KNEE - COMPLETE 4+ VIEW COMPARISON:  None. FINDINGS: Minimal early tricompartmental osteoarthritic change. Possible small joint effusion. No evidence of acute fracture or dislocation. No focal bony abnormality. IMPRESSION: 1. No acute  findings. 2. Minimal early osteoarthritic  change. Possible small joint effusion. Electronically Signed   By: Marin Olp M.D.   On: 09/24/2021 08:26    Procedures Procedures   Medications Ordered in ED Medications - No data to display  ED Course  I have reviewed the triage vital signs and the nursing notes.  Pertinent labs & imaging results that were available during my care of the patient were reviewed by me and considered in my medical decision making (see chart for details).    MDM Rules/Calculators/A&P                         Patient without significant physical exam findings other than trace effusion of bilateral knees.  Otherwise neurovascularly intact bilateral lower extremities.  Patient is able to ambulate with some pain.  Patient has history of rheumatoid arthritis, radiographic imaging shows some early osteoarthritis changes.  Patient is maxed out on anti-inflammatory treatment, as well as Tylenol at this time.  Encouraged continued use of this regimen, as well as we will add a prednisone taper to help address inflammation in context of likely rheumatoid arthritis flare.  Encourage patient to reestablish care with her rheumatologist when she regains insurance in a month.  In the meantime encouraged patient to follow-up with community health and wellness for financial assistance.  Patient discharged in stable condition at this time, return precautions are given. Final Clinical Impression(s) / ED Diagnoses Final diagnoses:  Acute pain of both knees  Arthritis    Rx / DC Orders ED Discharge Orders          Ordered    predniSONE (STERAPRED UNI-PAK 21 TAB) 10 MG (21) TBPK tablet  Daily        09/24/21 0937             Anselmo Pickler, [name] 09/24/21 0940    Sherwood Gambler, MD 09/25/21 1458

## 2021-09-24 NOTE — ED Notes (Signed)
I applied ice to both knee's

## 2021-09-24 NOTE — Care Management (Signed)
Consult placed for medication Assistance with RA medication. Presented with swollen knees to the ED. Lost insurance recently, no PCP listed. CHW information placed in patient instructions to establish PCP. Could do MATCH for medications if doesn't exceed cap. Any specialty or infusion medications would be unlikely to be covered..Need updated list of medications and plan for ordering. Messaged CSW and provider.

## 2021-09-24 NOTE — ED Triage Notes (Signed)
Patient states she was at work last week when she began having bilateral knee pain. Patient states that she was using tylenol with some relief, but over the last week, OTC measures have stopped working. Patient with hx of RA, but has been unable to afford her medications since March due to losing insurance. Patient would like to talk with social work for medication assistance. Patient is ambulatory, bilateral knee swelling is noted.

## 2021-10-03 ENCOUNTER — Other Ambulatory Visit: Payer: Self-pay | Admitting: Psychiatry

## 2021-10-03 DIAGNOSIS — F4310 Post-traumatic stress disorder, unspecified: Secondary | ICD-10-CM

## 2021-10-03 DIAGNOSIS — F3342 Major depressive disorder, recurrent, in full remission: Secondary | ICD-10-CM

## 2021-10-05 ENCOUNTER — Inpatient Hospital Stay: Payer: Self-pay | Admitting: Physician Assistant

## 2021-10-10 ENCOUNTER — Telehealth: Payer: Self-pay

## 2021-10-10 NOTE — Telephone Encounter (Signed)
Provider Levada Dy out the office 10/12/21. Left vm for patient to call [phone] to reschedule.

## 2021-10-12 ENCOUNTER — Inpatient Hospital Stay: Payer: Self-pay | Admitting: Physician Assistant

## 2021-11-15 ENCOUNTER — Encounter: Payer: Self-pay | Admitting: Neurology

## 2021-11-15 ENCOUNTER — Ambulatory Visit (INDEPENDENT_AMBULATORY_CARE_PROVIDER_SITE_OTHER): Payer: Self-pay | Admitting: Neurology

## 2021-11-15 ENCOUNTER — Other Ambulatory Visit: Payer: Self-pay

## 2021-11-15 VITALS — BP 136/90 | HR 88 | Ht 63.0 in | Wt 203.0 lb

## 2021-11-15 DIAGNOSIS — G500 Trigeminal neuralgia: Secondary | ICD-10-CM

## 2021-11-15 MED ORDER — GABAPENTIN 300 MG PO CAPS: 300.0000 mg | ORAL_CAPSULE | Freq: Two times a day (BID) | ORAL | 5 refills | Status: DC

## 2021-11-15 NOTE — Progress Notes (Signed)
PATIENT: Joanne Garrett DOB: 13-Dec-1975  REASON FOR VISIT: follow up right facial pain V2 HISTORY FROM: patient Primary Neurologist: Dr. Jaynee Eagles since Dr. Jannifer Franklin retired   HISTORY OF PRESENT ILLNESS: Today 11/15/21  Joanne Garrett here today for follow-up with history of right facial pain, V2 distribution.  Has not been seen since July 2021.  At that time, she questioned if she had a TIA due to intermittent episode of left arm weakness.  MRI of the brain showed some small subcortical white matter lesions that are nonspecific. Right TN V2 area is doing okay but is more bothersome since she ran out of her medication. Hasn't had insurance up until now.  Was taking gabapentin 600 mg twice a day, carbamazepine 200 mg twice a day. Her pain is 2/10.  Putting glasses on is can trigger pain. Has RA, waiting to see rheumatology. Needs updated refills. Joanne Garrett looks great today .   Update 05/31/2020 SS: Joanne Garrett is a 46 year old female with history of RA, PTSD, and right-sided facial pain in the V2 distribution.  Could be trigeminal neuralgia, occasional watering of the right eye makes it necessary to consider SUNCT headache.  She has benefited from gabapentin, carbamazepine was added.  Pain is overall well controlled, does have some drowsiness secondary to gabapentin.  Today, mentions is concerned she may have had a TIA last week.  For several days, had left arm weakness, did not think much about it.  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  Felt okay the next day.  On Sunday night, was driving home, had some blurry vision, dull left headache.  Weakness had subsided at that point.  On Monday, she rested, didn't go to work.  Today, no deficits, does have mild left-sided headache.  Has history of migraines years ago, doesn't feel like that.  Has hypertension, did not take BP medication  today.  Is seeing provider at urgent care/primary care.  Recently seen, returning soon, on amlodipine, plan to add HCTZ.  Reports cholesterol was checked recently, was normal.  Recently had a liver biopsy.  Denies any speech difficulty, or changes to the walking or balance.  Presents today for evaluation unaccompanied.  HISTORY 04/15/2019 Dr. Jannifer Franklin: Joanne Garrett is a 46 year old right-handed black female with a history of rheumatoid arthritis, and a history of posttraumatic stress disorder.  The patient is being followed through this office for right-sided facial pain in the V2 distribution.  This may represent trigeminal neuralgia, but the patient does report some occasional watering of the right eye, and a SUNCT type headache may need to be considered as well.  At any rate, the patient seems to have gained benefit with the use of gabapentin, she currently is taking 600 mg twice daily and tolerates the medication well.  She has reported memory issues in the past but she claims that this is doing better for her.  She will be going for a sleep study in July 2020.  The patient does have some difficulty getting to sleep at night at times.  She used melatonin but this did not really help much.  She overall claims that she feels good.  She is followed through psychiatry for posttraumatic stress disorder.    REVIEW OF SYSTEMS: Out of a complete 14 system review of symptoms, the patient complains only of the following symptoms, and all other reviewed systems are negative.  Facial pain, right  ALLERGIES: Allergies  Allergen Reactions   Shellfish Allergy Itching and Other (See Comments)    HIVES IF GOES OVERBOARD, HAS HAD IV DYE AND HAD NO PROBLEMS   Bactrim [Sulfamethoxazole-Trimethoprim] Hives and Other (See Comments)   Chocolate Flavor Itching and Other (See Comments)    PLAIN CHOCOLATE IN ABUNDANCE    HOME MEDICATIONS: Outpatient Medications Prior to Visit  Medication Sig Dispense Refill    amLODipine (NORVASC) 10 MG tablet Take 1 tablet (10 mg total) by mouth daily. 30 tablet 0   carbamazepine (TEGRETOL) 200 MG tablet Take 1 tablet (200 mg total) by mouth 2 (two) times daily. (Patient taking differently: Take 200 mg by mouth 2 (two) times daily as needed.) 60 tablet 5   folic acid (FOLVITE) 1 MG tablet Take 1 mg by mouth daily.     gabapentin (NEURONTIN) 600 MG tablet Take 1 tablet (600 mg total) by mouth 2 (two) times daily. 180 tablet 0   golimumab (SIMPONI ARIA) 50 MG/4ML SOLN injection Inject 50 mg into the vein every 30 (thirty) days.      LORazepam (ATIVAN) 0.5 MG tablet 0.25-0.5 mg daily as needed for anxiety 30 tablet 2   meclizine (ANTIVERT) 25 MG tablet Take 1 tablet (25 mg total) by mouth 2 (two) times daily as needed for dizziness. 30 tablet 0   meloxicam (MOBIC) 15 MG tablet Take 15 mg by mouth daily as needed for pain.     Multiple Vitamin (MULTIVITAMIN) tablet Take 1 tablet by mouth daily.     potassium chloride SA (K-DUR) 20 MEQ tablet TAKE 2 TABLETS BY MOUTH FOR 7 DAYS THEN 1 TABLET DAILY (Patient taking differently: Take 20 mEq by mouth daily.) 30 tablet 0   valACYclovir (VALTREX) 1000 MG tablet Take 1 tablet (1,000 mg total) by mouth 2 (two) times daily as needed. X 5 days during an outbreak (Patient taking differently: Take 1,000 mg by mouth 2 (two) times daily as needed (for 5 days during an outbreak).) 30 tablet 2   venlafaxine XR (EFFEXOR-XR) 150 MG 24 hr capsule Take total of 225 mg daily (150 mg + 75 mg daily) 90 capsule 1   venlafaxine XR (EFFEXOR-XR) 75 MG 24 hr capsule 225 mg daily (150 mg + 75 mg daily) 90 capsule 1   amLODipine (NORVASC) 10 MG tablet TAKE 1 TABLET BY MOUTH DAILY (Patient taking differently: Take 10 mg by mouth daily.) 30 tablet 5   amoxicillin-clavulanate (AUGMENTIN) 875-125 MG tablet Take 1 tablet by mouth 2 (two) times daily. One po bid x 7 days 14 tablet 0   cephALEXin (KEFLEX) 500 MG capsule Take 1 capsule (500 mg total) by mouth 4  (four) times daily. 20 capsule 0   methocarbamol (ROBAXIN) 500 MG tablet Take 500 mg by mouth 3 (three) times daily as needed for muscle spasms.     methocarbamol (ROBAXIN) 500 MG tablet Take 1 tablet (500 mg total) by mouth every 6 (six) hours as needed for muscle spasms. 30 tablet 0   Methotrexate Sodium (METHOTREXATE, PF,) 50 MG/2ML injection Inject 0.8 mLs into the muscle once a week.     methylPREDNISolone (MEDROL DOSEPAK) 4 MG TBPK tablet Per pack instruction 21 tablet 0   ondansetron (ZOFRAN ODT) 4 MG disintegrating tablet Take 1 tablet (4 mg total) by mouth every 8 (eight) hours as needed for nausea or vomiting. 20 tablet 0   predniSONE (STERAPRED UNI-PAK 21 TAB) 10 MG (21) TBPK tablet Take by mouth daily. Take  6 tabs by mouth daily  for 2 days, then 5 tabs for 2 days, then 4 tabs for 2 days, then 3 tabs for 2 days, 2 tabs for 2 days, then 1 tab by mouth daily for 2 days 42 tablet 0   No facility-administered medications prior to visit.    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 and completed radiation 09-30-2012 and completed anti-estrogen therapy   Depression    Genital herpes    Hematuria    History of hyperthyroidism 02/2001   s/p  RAI 03-2001   History of panic attacks    History of radiation therapy    completed 09-23-2012  50Gy in 25 fractions and boost completed [phone]   History of thyroid nodule    Hypertension    Renal calculus, right    Rheumatoid arthritis ([place])    Trigeminal neuralgia of right side of face 04/15/2019   V2   Urgency of urination     PAST SURGICAL HISTORY: Past Surgical History:  Procedure Laterality Date   ABDOMINAL HYSTERECTOMY     ANTERIOR CERVICAL DECOMP/DISCECTOMY FUSION  05-18-2015   [facility]   BREAST LUMPECTOMY Left 07-03-2012    [facility]   CYSTOSCOPY WITH RETROGRADE PYELOGRAM,  URETEROSCOPY AND STENT PLACEMENT Right 09/09/2017   Procedure: CYSTOSCOPY WITH RIGHT RETROGRADE PYELOGRAM, URETEROSCOPY , STONE BASKETRY AND STENT PLACEMENT;  Surgeon: Lucas Mallow, MD;  Location: [facility];  Service: Urology;  Laterality: Right;   IR TRANSCATHETER BX  02/02/2020   IR US GUIDE VASC ACCESS RIGHT  02/02/2020   IR VENOGRAM HEPATIC W HEMODYNAMIC EVALUATION  02/02/2020   KNEE ARTHROSCOPY Left 2009   TOTAL ABDOMINAL HYSTERECTOMY W/ BILATERAL SALPINGOOPHORECTOMY  2014    FAMILY HISTORY: Family History  Problem Relation Age of Onset   Hypertension Mother    COPD Mother    Depression Mother    Hypertension Father    Kidney disease Father    Alcohol abuse Father    Hypertension Brother    Alzheimer's disease Paternal Grandmother     SOCIAL HISTORY: Social History   Socioeconomic History   Marital status: Single    Spouse name: Not on file   Number of children: Not on file   Years of education: Not on file   Highest education level: Not on file  Occupational History   Not on file  Tobacco Use   Smoking status: Never   Smokeless tobacco: Never  Vaping Use   Vaping Use: Never used  Substance and Sexual Activity   Alcohol use: No   Drug use: No   Sexual activity: Not Currently    Birth control/protection: Surgical  Other Topics Concern   Not on file  Social History Narrative   Not on file   Social Determinants of Health   Financial Resource Strain: Not on file  Food Insecurity: Not on file  Transportation Needs: Not on file  Physical Activity: Not on file  Stress: Not on file  Social Connections: Not on file  Intimate Partner Violence: Not on file   PHYSICAL EXAM  Vitals:   11/15/21 1350  BP: 136/90  Pulse: 88  Weight: 203 lb (92.1 kg)  Height: 5\' 3"  (1.6 m)    Body mass index is 35.96 kg/m.  Generalized: Well developed, in no acute  distress   Neurological examination  Mentation: Alert oriented to time, place, history  taking. Follows all commands speech and language fluent Cranial nerve II-XII: Pupils were equal round reactive to light. Extraocular movements were full, visual field were full on confrontational test. Facial sensation and strength were normal.  Head turning and shoulder shrug  were normal and symmetric. Motor: The motor testing reveals 5 over 5 strength of all 4 extremities. Good symmetric motor tone is noted throughout.  Sensory: Sensory testing is intact to soft touch on all 4 extremities. No evidence of extinction is noted.  Coordination: Cerebellar testing reveals good finger-nose-finger and heel-to-shin bilaterally.  Gait and station: Gait is normal.  Reflexes: Deep tendon reflexes are symmetric and normal bilaterally.   DIAGNOSTIC DATA (LABS, IMAGING, TESTING) - I reviewed patient records, labs, notes, testing and imaging myself where available.  Lab Results  Component Value Date   WBC 8.5 08/15/2020   HGB 10.5 (L) 08/15/2020   HCT 33.5 (L) 08/15/2020   MCV 87.0 08/15/2020   PLT 423 (H) 08/15/2020      Component Value Date/Time   NA 143 08/15/2020 1031   NA 144 05/31/2020 0924   K 3.5 08/15/2020 1031   CL 101 08/15/2020 1031   CO2 29 08/15/2020 1031   GLUCOSE 85 08/15/2020 1031   BUN 8 08/15/2020 1031   BUN 15 05/31/2020 0924   CREATININE 0.73 08/15/2020 1031   CREATININE 0.56 01/16/2017 0927   CALCIUM 9.1 08/15/2020 1031   PROT 7.4 08/15/2020 1031   PROT 6.8 05/31/2020 0924   ALBUMIN 3.1 (L) 08/15/2020 1031   ALBUMIN 4.3 05/31/2020 0924   AST 22 08/15/2020 1031   ALT 48 (H) 08/15/2020 1031   ALKPHOS 86 08/15/2020 1031   BILITOT 0.6 08/15/2020 1031   BILITOT <0.2 05/31/2020 0924   GFRNONAA >60 08/15/2020 1031   GFRAA 124 05/31/2020 0924   Lab Results  Component Value Date   CHOL 226 (H) 11/20/2018   HDL 75 11/20/2018   LDLCALC 131 (H) 11/20/2018   TRIG 101 11/20/2018   CHOLHDL 3.0 11/20/2018   No results found for: HGBA1C Lab Results  Component Value Date    VITAMINB12 409 09/18/2018   Lab Results  Component Value Date   TSH 0.943 11/20/2018   ASSESSMENT AND PLAN 46 y.o. year old female  has a past medical history of Anxiety, Breast cancer ([place]) (onologist-- dr sorscher w/ [facility]), Depression, Genital herpes, Hematuria, History of hyperthyroidism (02/2001), History of panic attacks, History of radiation therapy, History of thyroid nodule, Hypertension, Renal calculus, right, Rheumatoid arthritis ([place]), Trigeminal neuralgia of right side of face (04/15/2019), and Urgency of urination. here with:   1.  Right facial pain neuralgia, V2 distribution  -Lost to follow-up since July 2021, she has been out of medication for several months, her current pain level is 2/10 -Since under relatively good control, will hold off on restarting carbamazepine, will start gabapentin at a lower dose -Restart gabapentin 300 mg twice a day, call for dose adjustment -When last seen was taking gabapentin 600 mg twice a day, carbamazepine 200 mg twice a day -She will be followed by Dr. Jaynee Eagles since Dr. Jannifer Franklin has retired, I will see the patient back in 6 months, if she continues to do well will transition back to primary care  Butler Denmark, AGNP-C, DNP 11/15/2021, 2:05 PM [facility] [address] [place], [place] [address] [phone]

## 2021-11-15 NOTE — Patient Instructions (Signed)
Restart gabapentin 300 mg twice daily Call for any dose adjustments See you back in 6 months

## 2021-12-15 ENCOUNTER — Encounter (HOSPITAL_COMMUNITY): Payer: Self-pay | Admitting: Emergency Medicine

## 2021-12-15 ENCOUNTER — Emergency Department (HOSPITAL_COMMUNITY)
Admission: EM | Admit: 2021-12-15 | Discharge: 2021-12-16 | Discharge status: Against Medical Advice | Payer: Self-pay | Attending: Emergency Medicine | Admitting: Emergency Medicine

## 2021-12-15 ENCOUNTER — Other Ambulatory Visit: Payer: Self-pay

## 2021-12-15 DIAGNOSIS — M545 Low back pain, unspecified: Secondary | ICD-10-CM | POA: Insufficient documentation

## 2021-12-15 DIAGNOSIS — M25562 Pain in left knee: Secondary | ICD-10-CM | POA: Insufficient documentation

## 2021-12-15 DIAGNOSIS — Z5321 Procedure and treatment not carried out due to patient leaving prior to being seen by health care provider: Secondary | ICD-10-CM | POA: Insufficient documentation

## 2021-12-15 MED ORDER — LIDOCAINE 5 % EX PTCH
1.0000 | MEDICATED_PATCH | CUTANEOUS | Status: DC
  Filled 2021-12-15: qty 1

## 2021-12-15 NOTE — ED Triage Notes (Signed)
Pt reported to ED with c/o pain to left hip that radiates downwards towards left knee. Pt reports pain being so severe that she cannot sit down. Denies any injury to area. States she was working today and was on her feet a lot.

## 2021-12-15 NOTE — ED Notes (Signed)
Pt stated she could not wait any longer and has already waited for 2 hours. Pt said she was going to leave and go to another hospital. Pt left.

## 2021-12-15 NOTE — ED Provider Triage Note (Signed)
Emergency Medicine Provider Triage Evaluation Note  Joanne Garrett , a 46 y.o. female  was evaluated in triage.  Pt complains of left low back pain radiating to left knee.  Unable to stand upright secondary to pain.  Denies any falls or injuries, onset while at work today, tried warm shower, warm and cold compresses without any improvement.  Reports history of rheumatoid arthritis..  Review of Systems  Positive: Left low back pain, left knee pain Negative: Fever  Physical Exam  BP (!) 149/85    Pulse 96    Temp 98.6 F (37 C) (Oral)    Resp 16    LMP 06/11/2013    SpO2 100%  Gen:   Awake, no distress   Resp:  Normal effort  MSK:   Unable to bear weight on left knee secondary to pain which radiates from left buttock down to knee. TTP left SI Other:    Medical Decision Making  Medically screening exam initiated at 9:08 PM.  Appropriate orders placed.  Joanne Garrett was informed that the remainder of the evaluation will be completed by another provider, this initial triage assessment does not replace that evaluation, and the importance of remaining in the ED until their evaluation is complete.     Tacy Learn, [name] 12/15/21 2110

## 2021-12-16 DIAGNOSIS — M25561 Pain in right knee: Secondary | ICD-10-CM | POA: Insufficient documentation

## 2021-12-18 ENCOUNTER — Inpatient Hospital Stay (HOSPITAL_COMMUNITY): Admission: RE | Admit: 2021-12-18 | Payer: Self-pay | Source: Ambulatory Visit

## 2021-12-18 ENCOUNTER — Other Ambulatory Visit (HOSPITAL_COMMUNITY): Payer: Self-pay | Admitting: Medical

## 2021-12-18 DIAGNOSIS — M79605 Pain in left leg: Secondary | ICD-10-CM

## 2022-04-01 ENCOUNTER — Emergency Department (HOSPITAL_COMMUNITY)
Admission: EM | Admit: 2022-04-01 | Discharge: 2022-04-01 | Disposition: A | Discharge status: Home / Self Care | Payer: 59 | Attending: Emergency Medicine | Admitting: Emergency Medicine

## 2022-04-01 ENCOUNTER — Other Ambulatory Visit: Payer: Self-pay

## 2022-04-01 ENCOUNTER — Encounter (HOSPITAL_COMMUNITY): Payer: Self-pay

## 2022-04-01 DIAGNOSIS — M2550 Pain in unspecified joint: Secondary | ICD-10-CM

## 2022-04-01 DIAGNOSIS — M79671 Pain in right foot: Secondary | ICD-10-CM | POA: Diagnosis not present

## 2022-04-01 DIAGNOSIS — M79641 Pain in right hand: Secondary | ICD-10-CM | POA: Insufficient documentation

## 2022-04-01 DIAGNOSIS — M25512 Pain in left shoulder: Secondary | ICD-10-CM | POA: Insufficient documentation

## 2022-04-01 MED ORDER — PREDNISONE 20 MG PO TABS
60.0000 mg | ORAL_TABLET | Freq: Once | ORAL | Status: AC
  Administered 2022-04-01: 60 mg via ORAL
  Filled 2022-04-01: qty 3

## 2022-04-01 MED ORDER — PREDNISONE 10 MG PO TABS: ORAL_TABLET | ORAL | 0 refills | Status: DC

## 2022-04-01 MED ORDER — HYDROCODONE-ACETAMINOPHEN 5-325 MG PO TABS: 1.0000 | ORAL_TABLET | ORAL | 0 refills | Status: DC | PRN

## 2022-04-01 MED ORDER — HYDROCODONE-ACETAMINOPHEN 5-325 MG PO TABS
2.0000 | ORAL_TABLET | Freq: Once | ORAL | Status: AC
  Administered 2022-04-01: 2 via ORAL
  Filled 2022-04-01: qty 2

## 2022-04-01 NOTE — Discharge Instructions (Signed)
Follow-up for further care and treatment with the doctor helps manage your joint pain, as soon as possible.  Do not drive or drink alcohol when taking the narcotic pain reliever.

## 2022-04-01 NOTE — ED Triage Notes (Signed)
Pt complains of pain to her right hand/wrist, right foot, and left shoulder since today. Pt reports a hx of rheumatoid arthritis.

## 2022-04-01 NOTE — ED Provider Notes (Signed)
[facility] DEPT Provider Note   CSN: [id number] Arrival date & time: 04/01/22  2046     History  Chief Complaint  Patient presents with   Joint Pain    Joanne Garrett is a 46 y.o. female.  HPI She presents for evaluation of right hand pain but also has pain in her left shoulder, and right foot.  She has had similar pains in the past with rheumatoid flares.  Last flare was left shoulder, 1 month ago at which time she was treated with prednisone.  Today she tried ibuprofen without relief of her pain.  She denies fever, chills, nausea, vomiting, shortness of breath or chest pain.  She drove her vehicle here for evaluation.  She understands that if she gets narcotic pain medicine, she will not be able to drive home.    Home Medications Prior to Admission medications   Medication Sig Start Date End Date Taking? Authorizing Provider  amLODipine (NORVASC) 10 MG tablet Take 1 tablet (10 mg total) by mouth daily. 12/26/20   Charlesetta Shanks, MD  folic acid (FOLVITE) 1 MG tablet Take 1 mg by mouth daily. 10/20/18   [provider]  gabapentin (NEURONTIN) 300 MG capsule Take 1 capsule (300 mg total) by mouth 2 (two) times daily. 11/15/21   Suzzanne Cloud, NP  golimumab (SIMPONI ARIA) 50 MG/4ML SOLN injection Inject 50 mg into the vein every 30 (thirty) days.  03/03/19   [provider]  LORazepam (ATIVAN) 0.5 MG tablet 0.25-0.5 mg daily as needed for anxiety 10/17/20   Norman Clay, MD  meclizine (ANTIVERT) 25 MG tablet Take 1 tablet (25 mg total) by mouth 2 (two) times daily as needed for dizziness. 08/25/18   Henson, Vickie L, NP-C  meloxicam (MOBIC) 15 MG tablet Take 15 mg by mouth daily as needed for pain. 05/27/20   [provider]  Multiple Vitamin (MULTIVITAMIN) tablet Take 1 tablet by mouth daily.    [provider]  potassium chloride SA (K-DUR) 20 MEQ tablet TAKE 2 TABLETS BY MOUTH FOR 7 DAYS THEN 1 TABLET  DAILY Patient taking differently: Take 20 mEq by mouth daily. 03/09/19   Henson, Vickie L, NP-C  valACYclovir (VALTREX) 1000 MG tablet Take 1 tablet (1,000 mg total) by mouth 2 (two) times daily as needed. X 5 days during an outbreak Patient taking differently: Take 1,000 mg by mouth 2 (two) times daily as needed (for 5 days during an outbreak). 08/15/18   Henson, Vickie L, NP-C  venlafaxine XR (EFFEXOR-XR) 150 MG 24 hr capsule Take total of 225 mg daily (150 mg + 75 mg daily) 08/18/20   Norman Clay, MD  venlafaxine XR (EFFEXOR-XR) 75 MG 24 hr capsule 225 mg daily (150 mg + 75 mg daily) 08/18/20   Norman Clay, MD      Allergies    Shellfish allergy, Bactrim [sulfamethoxazole-trimethoprim], and Chocolate flavor    Review of Systems   Review of Systems  Physical Exam Updated Vital Signs BP (!) 176/102 (BP Location: Left Arm)   Pulse 93   Temp 98.7 F (37.1 C) (Oral)   Resp 18   Ht '5\' 3"'$  (1.6 m)   Wt 90.7 kg   LMP 06/11/2013   SpO2 99%   BMI 35.43 kg/m  Physical Exam Vitals and nursing note reviewed.  Constitutional:      General: She is not in acute distress.    Appearance: She is well-developed. She is not ill-appearing or diaphoretic.  HENT:  Head: Normocephalic and atraumatic.     Right Ear: External ear normal.     Left Ear: External ear normal.  Eyes:     Conjunctiva/sclera: Conjunctivae normal.     Pupils: Pupils are equal, round, and reactive to light.  Neck:     Trachea: Phonation normal.  Cardiovascular:     Rate and Rhythm: Normal rate.  Pulmonary:     Effort: Pulmonary effort is normal.     Breath sounds: Normal breath sounds.  Abdominal:     General: There is no distension.  Musculoskeletal:     Cervical back: Normal range of motion and neck supple.     Comments: Mild swelling right hand.  No erythema or warmth.  No significant tenderness or swelling of the right wrist.  Neurovascular intact distally in the fingers of the right hand.  Skin:     General: Skin is warm and dry.  Neurological:     Mental Status: She is alert and oriented to person, place, and time.     Cranial Nerves: No cranial nerve deficit.     Sensory: No sensory deficit.     Motor: No abnormal muscle tone.     Coordination: Coordination normal.  Psychiatric:        Mood and Affect: Mood normal.        Behavior: Behavior normal.        Thought Content: Thought content normal.        Judgment: Judgment normal.    ED Results / Procedures / Treatments   Labs (all labs ordered are listed, but only abnormal results are displayed) Labs Reviewed - No data to display  EKG None  Radiology No results found.  Procedures Procedures    Medications Ordered in ED Medications - No data to display  ED Course/ Medical Decision Making/ A&P                           Medical Decision Making Patient presents for evaluation of recurrent pain in extremities, this time hand.  She states this is typical of her usual rheumatoid flare.  She states that she has rheumatoid arthritis.  Last time she received prednisone was about a month ago.  Problems Addressed: Polyarthralgia: chronic illness or injury with exacerbation, progression, or side effects of treatment    Details: Possibly related to rheumatoid arthritis.  Amount and/or Complexity of Data Reviewed Independent Historian:     Details: She is cogent historian. External Data Reviewed: notes.    Details: Visit documentation from orthopedics recently.  Risk Prescription drug management. Decision regarding hospitalization. Risk Details: Patient presenting with atraumatic joint pain, multiple sites, primarily right hand today.  Right hand is swollen without signs or evidence of cellulitis.  Doubt fracture since she has not had any trauma.  History of arthritis, rheumatoid, so we will treat with prednisone for decrease inflammation.  Also give a short course of hydrocodone to help with pain.  Advised use heat on the  sore areas.  Advised to follow-up with orthopedics or PCP for ongoing management.           Final Clinical Impression(s) / ED Diagnoses Final diagnoses:  None    Rx / DC Orders ED Discharge Orders     None         Daleen Bo, MD 04/01/22 2248

## 2022-04-01 NOTE — ED Notes (Signed)
Pt discharged. Instructions and prescriptions given. AAOX4. Pt in no apparent distress with moderate pain. The opportunity to ask questions was provided.  

## 2022-05-31 DIAGNOSIS — D0510 Intraductal carcinoma in situ of unspecified breast: Secondary | ICD-10-CM | POA: Insufficient documentation

## 2022-05-31 DIAGNOSIS — E785 Hyperlipidemia, unspecified: Secondary | ICD-10-CM | POA: Insufficient documentation

## 2022-05-31 DIAGNOSIS — F419 Anxiety disorder, unspecified: Secondary | ICD-10-CM | POA: Insufficient documentation

## 2022-06-17 ENCOUNTER — Emergency Department (HOSPITAL_COMMUNITY)
Admission: EM | Admit: 2022-06-17 | Discharge: 2022-06-17 | Disposition: A | Discharge status: Home / Self Care | Payer: 59 | Attending: Emergency Medicine | Admitting: Emergency Medicine

## 2022-06-17 ENCOUNTER — Encounter (HOSPITAL_COMMUNITY): Payer: Self-pay

## 2022-06-17 ENCOUNTER — Emergency Department (HOSPITAL_COMMUNITY): Payer: 59

## 2022-06-17 ENCOUNTER — Other Ambulatory Visit: Payer: Self-pay

## 2022-06-17 DIAGNOSIS — Z79899 Other long term (current) drug therapy: Secondary | ICD-10-CM | POA: Diagnosis not present

## 2022-06-17 DIAGNOSIS — M79672 Pain in left foot: Secondary | ICD-10-CM | POA: Diagnosis not present

## 2022-06-17 MED ORDER — METHYLPREDNISOLONE 4 MG PO TBPK: ORAL_TABLET | ORAL | 0 refills | Status: DC

## 2022-06-17 NOTE — ED Provider Notes (Signed)
[facility] DEPT Provider Note   CSN: [id number] Arrival date & time: 06/17/22  0042     History  Chief Complaint  Patient presents with   Foot Pain    Joanne Garrett is a 46 y.o. female.   Foot Pain   46 year old female presenting to the ED with atraumatic left foot pain for the past 2 days.  Does have history of RA, has not been following up with her rheumatologist until recently.  She is awaiting approval to get back on her simponi [place].  States left foot hurts, more across the top of her foot.  This does seem worse with walking especially while wearing the shoe.  She denies any numbness or tingling.  She has not had any falls.  She uses cane to assist with ambulation at baseline.  Home Medications Prior to Admission medications   Medication Sig Start Date End Date Taking? Authorizing Provider  methylPREDNISolone (MEDROL DOSEPAK) 4 MG TBPK tablet Follow package directions 06/17/22  Yes Larene Pickett, [name]  albuterol (VENTOLIN HFA) 108 (90 Base) MCG/ACT inhaler 2 puffs every 6 (six) hours as needed for wheezing or shortness of breath. 10/13/21   [provider]  amLODipine (NORVASC) 10 MG tablet Take 1 tablet (10 mg total) by mouth daily. 12/26/20   Charlesetta Shanks, MD  clindamycin (CLEOCIN T) 1 % lotion 1 application. daily. 03/05/22   [provider]  folic acid (FOLVITE) 1 MG tablet Take 1 mg by mouth daily. 10/20/18   [provider]  gabapentin (NEURONTIN) 300 MG capsule Take 1 capsule (300 mg total) by mouth 2 (two) times daily. 11/15/21   Suzzanne Cloud, NP  HYDROcodone-acetaminophen (NORCO) 5-325 MG tablet Take 1 tablet by mouth every 4 (four) hours as needed. 04/01/22   Daleen Bo, MD  LORazepam (ATIVAN) 0.5 MG tablet 0.25-0.5 mg daily as needed for anxiety Patient not taking: Reported on 04/01/2022 10/17/20   Norman Clay, MD  LORazepam (ATIVAN) 1 MG tablet Take 1 mg by mouth daily as needed for anxiety.     [provider]  meclizine (ANTIVERT) 25 MG tablet Take 1 tablet (25 mg total) by mouth 2 (two) times daily as needed for dizziness. Patient not taking: Reported on 04/01/2022 08/25/18   Girtha Rm, NP-C  Multiple Vitamin (MULTIVITAMIN) tablet Take 1 tablet by mouth daily.    [provider]  potassium chloride SA (K-DUR) 20 MEQ tablet TAKE 2 TABLETS BY MOUTH FOR 7 DAYS THEN 1 TABLET DAILY Patient taking differently: Take 20 mEq by mouth 2 (two) times daily. 03/09/19   Henson, Vickie L, NP-C  predniSONE (DELTASONE) 10 MG tablet Take Q day 6,5,4,3,2,1 04/01/22   Daleen Bo, MD  valACYclovir (VALTREX) 1000 MG tablet Take 1 tablet (1,000 mg total) by mouth 2 (two) times daily as needed. X 5 days during an outbreak Patient taking differently: Take 1,000 mg by mouth 2 (two) times daily as needed (for 5 days during an outbreak). 08/15/18   Henson, Vickie L, NP-C  venlafaxine XR (EFFEXOR-XR) 150 MG 24 hr capsule Take total of 225 mg daily (150 mg + 75 mg daily) Patient taking differently: 150 mg daily with breakfast. Take total of 225 mg daily (150 mg + 75 mg daily) 08/18/20   Hisada, Elie Goody, MD  venlafaxine XR (EFFEXOR-XR) 75 MG 24 hr capsule 225 mg daily (150 mg + 75 mg daily) Patient taking differently: 75 mg daily with breakfast. 225 mg daily (150 mg + 75 mg  daily) 08/18/20   Norman Clay, MD      Allergies    Shellfish allergy, Bactrim [sulfamethoxazole-trimethoprim], and Chocolate flavor    Review of Systems   Review of Systems  Musculoskeletal:  Positive for arthralgias.  All other systems reviewed and are negative.   Physical Exam Updated Vital Signs BP (!) 157/118   Pulse 84   Temp 98.5 F (36.9 C) (Oral)   Resp 17   Ht '5\' 3"'$  (1.6 m)   Wt 90.7 kg   LMP 06/11/2013   SpO2 100%   BMI 35.43 kg/m   Physical Exam Vitals and nursing note reviewed.  Constitutional:      Appearance: She is well-developed.  HENT:     Head: Normocephalic and atraumatic.   Eyes:     Conjunctiva/sclera: Conjunctivae normal.     Pupils: Pupils are equal, round, and reactive to light.  Cardiovascular:     Rate and Rhythm: Normal rate and regular rhythm.     Heart sounds: Normal heart sounds.  Pulmonary:     Effort: Pulmonary effort is normal.     Breath sounds: Normal breath sounds.  Abdominal:     General: Bowel sounds are normal.     Palpations: Abdomen is soft.  Musculoskeletal:        General: Normal range of motion.     Cervical back: Normal range of motion.     Comments: Left foot is atraumatic in appearance, there is no bruising or deformity, tenderness across dorsum of the foot, DP pulse intact, moving toes normally, ambulatory with assistance of cane  Skin:    General: Skin is warm and dry.  Neurological:     Mental Status: She is alert and oriented to person, place, and time.     ED Results / Procedures / Treatments   Labs (all labs ordered are listed, but only abnormal results are displayed) Labs Reviewed - No data to display  EKG None  Radiology DG Foot Complete Left  Result Date: 06/17/2022 CLINICAL DATA:  Foot pain for 2 days, no known injury, initial encounter EXAM: LEFT FOOT - COMPLETE 3+ VIEW COMPARISON:  None Available. FINDINGS: Hallux valgus deformity is noted. No acute fracture or dislocation is seen. No soft tissue abnormality is noted. Calcaneal spurring and tarsal degenerative changes are seen. IMPRESSION: Degenerative change without acute abnormality. Electronically Signed   By: Inez Catalina M.D.   On: 06/17/2022 02:47    Procedures Procedures    Medications Ordered in ED Medications - No data to display  ED Course/ Medical Decision Making/ A&P                           Medical Decision Making Amount and/or Complexity of Data Reviewed Radiology: ordered.  Risk Prescription drug management.   46 year old female presenting to the ED with atraumatic left foot pain for the past 2 days.  Has history of RA, has  been off of her medications for quite some time, recently reestablished with her rheumatologist.  She is afebrile, nontoxic.  Left foot without any acute signs of trauma.  There is no swelling or deformity.  Foot is neurovascular intact.  X-ray with some degenerative changes but no acute findings.  Patient reports normally when this occurs she does a short burst of steroids which seems to help.  We will start low-dose Medrol Dosepak, she will follow-up with her rheumatologist on Monday.  Can return here for new concerns.  Final  Clinical Impression(s) / ED Diagnoses Final diagnoses:  Left foot pain    Rx / DC Orders ED Discharge Orders          Ordered    methylPREDNISolone (MEDROL DOSEPAK) 4 MG TBPK tablet        06/17/22 0409              Larene Pickett, [name] 06/17/22 0419    Quintella Reichert, MD 06/17/22 [phone]

## 2022-06-17 NOTE — ED Notes (Signed)
Applied post op shoe.

## 2022-06-17 NOTE — ED Triage Notes (Signed)
Nontraumatic left foot pain x 2 days.

## 2022-06-17 NOTE — Discharge Instructions (Signed)
Take the prescribed medication as directed. Follow-up with your rheumatologist. Return to the ED for new or worsening symptoms.

## 2022-06-26 DIAGNOSIS — M1712 Unilateral primary osteoarthritis, left knee: Secondary | ICD-10-CM | POA: Insufficient documentation

## 2022-07-09 ENCOUNTER — Encounter (HOSPITAL_COMMUNITY): Payer: Self-pay

## 2022-07-09 ENCOUNTER — Other Ambulatory Visit: Payer: Self-pay

## 2022-07-09 ENCOUNTER — Emergency Department (HOSPITAL_COMMUNITY)
Admission: EM | Admit: 2022-07-09 | Discharge: 2022-07-09 | Disposition: A | Discharge status: Home / Self Care | Payer: 59 | Attending: Emergency Medicine | Admitting: Emergency Medicine

## 2022-07-09 DIAGNOSIS — E876 Hypokalemia: Secondary | ICD-10-CM | POA: Diagnosis not present

## 2022-07-09 DIAGNOSIS — E039 Hypothyroidism, unspecified: Secondary | ICD-10-CM | POA: Insufficient documentation

## 2022-07-09 DIAGNOSIS — M130 Polyarthritis, unspecified: Secondary | ICD-10-CM | POA: Insufficient documentation

## 2022-07-09 DIAGNOSIS — Z79899 Other long term (current) drug therapy: Secondary | ICD-10-CM | POA: Diagnosis not present

## 2022-07-09 DIAGNOSIS — Z853 Personal history of malignant neoplasm of breast: Secondary | ICD-10-CM | POA: Insufficient documentation

## 2022-07-09 DIAGNOSIS — M25571 Pain in right ankle and joints of right foot: Secondary | ICD-10-CM | POA: Diagnosis present

## 2022-07-09 DIAGNOSIS — I10 Essential (primary) hypertension: Secondary | ICD-10-CM | POA: Diagnosis not present

## 2022-07-09 DIAGNOSIS — M2550 Pain in unspecified joint: Secondary | ICD-10-CM

## 2022-07-09 LAB — CBC WITH DIFFERENTIAL/PLATELET
Abs Immature Granulocytes: 0.03 10*3/uL (ref 0.00–0.07)
Basophils Absolute: 0.1 10*3/uL (ref 0.0–0.1)
Basophils Relative: 1 %
Eosinophils Absolute: 0.2 10*3/uL (ref 0.0–0.5)
Eosinophils Relative: 1 %
HCT: 35.8 % — ABNORMAL LOW (ref 36.0–46.0)
Hemoglobin: 11.4 g/dL — ABNORMAL LOW (ref 12.0–15.0)
Immature Granulocytes: 0 %
Lymphocytes Relative: 18 %
Lymphs Abs: 1.9 10*3/uL (ref 0.7–4.0)
MCH: 26.9 pg (ref 26.0–34.0)
MCHC: 31.8 g/dL (ref 30.0–36.0)
MCV: 84.4 fL (ref 80.0–100.0)
Monocytes Absolute: 0.9 10*3/uL (ref 0.1–1.0)
Monocytes Relative: 9 %
Neutro Abs: 7.4 10*3/uL (ref 1.7–7.7)
Neutrophils Relative %: 71 %
Platelets: 252 10*3/uL (ref 150–400)
RBC: 4.24 MIL/uL (ref 3.87–5.11)
RDW: 14.1 % (ref 11.5–15.5)
WBC: 10.4 10*3/uL (ref 4.0–10.5)
nRBC: 0 % (ref 0.0–0.2)

## 2022-07-09 LAB — COMPREHENSIVE METABOLIC PANEL
ALT: 17 U/L (ref 0–44)
AST: 17 U/L (ref 15–41)
Albumin: 4 g/dL (ref 3.5–5.0)
Alkaline Phosphatase: 63 U/L (ref 38–126)
Anion gap: 10 (ref 5–15)
BUN: 18 mg/dL (ref 6–20)
CO2: 29 mmol/L (ref 22–32)
Calcium: 9.6 mg/dL (ref 8.9–10.3)
Chloride: 103 mmol/L (ref 98–111)
Creatinine, Ser: 1.27 mg/dL — ABNORMAL HIGH (ref 0.44–1.00)
GFR, Estimated: 52 mL/min — ABNORMAL LOW (ref >60)
Glucose, Bld: 93 mg/dL (ref 70–99)
Potassium: 2.7 mmol/L — CL (ref 3.5–5.1)
Sodium: 142 mmol/L (ref 135–145)
Total Bilirubin: 0.7 mg/dL (ref 0.3–1.2)
Total Protein: 7.7 g/dL (ref 6.5–8.1)

## 2022-07-09 LAB — MAGNESIUM: Magnesium: 2.2 mg/dL (ref 1.7–2.4)

## 2022-07-09 MED ORDER — KETOROLAC TROMETHAMINE 30 MG/ML IJ SOLN
30.0000 mg | Freq: Once | INTRAMUSCULAR | Status: AC
  Administered 2022-07-09: 30 mg via INTRAMUSCULAR
  Filled 2022-07-09: qty 1

## 2022-07-09 MED ORDER — POTASSIUM CHLORIDE 10 MEQ/100ML IV SOLN
10.0000 meq | INTRAVENOUS | Status: DC
  Administered 2022-07-09: 10 meq via INTRAVENOUS
  Filled 2022-07-09: qty 100

## 2022-07-09 MED ORDER — SODIUM CHLORIDE 0.9 % IV BOLUS
1000.0000 mL | Freq: Once | INTRAVENOUS | Status: AC
  Administered 2022-07-09: 1000 mL via INTRAVENOUS

## 2022-07-09 MED ORDER — MELOXICAM 15 MG PO TABS: 15.0000 mg | ORAL_TABLET | Freq: Every day | ORAL | 0 refills | Status: DC

## 2022-07-09 MED ORDER — POTASSIUM CHLORIDE CRYS ER 20 MEQ PO TBCR
40.0000 meq | EXTENDED_RELEASE_TABLET | Freq: Once | ORAL | Status: AC
  Administered 2022-07-09: 40 meq via ORAL
  Filled 2022-07-09: qty 2

## 2022-07-09 NOTE — Discharge Instructions (Addendum)
Note the work-up today was overall reassuring.  As you requested, I will refill your Mobic as this seems to help most with your rheumatoid arthritis pain.  Keep your upcoming appointment tomorrow with rheumatologist for your infusion as this will help your symptoms more than any nonsteroidal we will.  Your potassium was low while in the emergency department.  We have replaced this orally as well as IV.  Make sure to take your at home potassium as prescribed by your primary care provider.  Is important to have your labs rechecked in 1 week to make sure potassium is within normal range and not too high or too low.  As we discussed, if the worrisome signs and symptoms we talked about become apparent, please do not hesitate to return to the emergency department

## 2022-07-09 NOTE — ED Provider Notes (Signed)
[facility] DEPT Provider Note   CSN: [id number] Arrival date & time: 07/09/22  1333     History  Chief Complaint  Patient presents with   Knee Pain   Foot Pain   Hand Pain    Joanne Garrett is a 46 y.o. female.   Knee Pain Foot Pain  Hand Pain   46 year old female presents emergency department with complaints of bilateral ankle, bilateral wrist and bilateral knee pain.  She states that symptoms began insidiously this past Sunday.  She has a history of RA and states it feels exactly the same as prior RA flareups.  She has been taking ibuprofen, Tylenol at home for pain yesterday with minimal relief of symptoms.  She states that she usually uses Mobic daily but is out of the medicine and is requesting a refill.  She also states she has an appointment tomorrow for an effusion related to her rheumatoid arthritis but is requesting Mobic in the meantime.  Denies fever, chills, night sweats, cough, congestion, sore throat, vaginal discharge, visual deficits, weakness/sensory deficits in arms or legs, known tick bite, rash, no traumatic mechanism.  Denies any current immunosuppressive medication.  Past medical history significant for rheumatoid arthritis, hypothyroidism, breast cancer, hypertension, major depressive disorder, PTSD.  Home Medications Prior to Admission medications   Medication Sig Start Date End Date Taking? Authorizing Provider  meloxicam (MOBIC) 15 MG tablet Take 1 tablet (15 mg total) by mouth daily. 07/09/22  Yes Dion Saucier A, PA  albuterol (VENTOLIN HFA) 108 (90 Base) MCG/ACT inhaler 2 puffs every 6 (six) hours as needed for wheezing or shortness of breath. 10/13/21   [provider]  amLODipine (NORVASC) 10 MG tablet Take 1 tablet (10 mg total) by mouth daily. 12/26/20   Charlesetta Shanks, MD  clindamycin (CLEOCIN T) 1 % lotion 1 application. daily. 03/05/22   [provider]  folic acid (FOLVITE) 1 MG tablet Take 1 mg  by mouth daily. 10/20/18   [provider]  gabapentin (NEURONTIN) 300 MG capsule Take 1 capsule (300 mg total) by mouth 2 (two) times daily. 11/15/21   Suzzanne Cloud, NP  HYDROcodone-acetaminophen (NORCO) 5-325 MG tablet Take 1 tablet by mouth every 4 (four) hours as needed. 04/01/22   Daleen Bo, MD  LORazepam (ATIVAN) 0.5 MG tablet 0.25-0.5 mg daily as needed for anxiety Patient not taking: Reported on 04/01/2022 10/17/20   Norman Clay, MD  LORazepam (ATIVAN) 1 MG tablet Take 1 mg by mouth daily as needed for anxiety.    [provider]  meclizine (ANTIVERT) 25 MG tablet Take 1 tablet (25 mg total) by mouth 2 (two) times daily as needed for dizziness. Patient not taking: Reported on 04/01/2022 08/25/18   Girtha Rm, NP-C  methylPREDNISolone (MEDROL DOSEPAK) 4 MG TBPK tablet Follow package directions 06/17/22   Larene Pickett, [name]  Multiple Vitamin (MULTIVITAMIN) tablet Take 1 tablet by mouth daily.    [provider]  potassium chloride SA (K-DUR) 20 MEQ tablet TAKE 2 TABLETS BY MOUTH FOR 7 DAYS THEN 1 TABLET DAILY Patient taking differently: Take 20 mEq by mouth 2 (two) times daily. 03/09/19   Henson, Vickie L, NP-C  predniSONE (DELTASONE) 10 MG tablet Take Q day 6,5,4,3,2,1 04/01/22   Daleen Bo, MD  valACYclovir (VALTREX) 1000 MG tablet Take 1 tablet (1,000 mg total) by mouth 2 (two) times daily as needed. X 5 days during an outbreak Patient taking differently: Take 1,000 mg by mouth 2 (two) times  daily as needed (for 5 days during an outbreak). 08/15/18   Henson, Vickie L, NP-C  venlafaxine XR (EFFEXOR-XR) 150 MG 24 hr capsule Take total of 225 mg daily (150 mg + 75 mg daily) Patient taking differently: 150 mg daily with breakfast. Take total of 225 mg daily (150 mg + 75 mg daily) 08/18/20   Hisada, Elie Goody, MD  venlafaxine XR (EFFEXOR-XR) 75 MG 24 hr capsule 225 mg daily (150 mg + 75 mg daily) Patient taking differently: 75 mg daily with breakfast. 225 mg  daily (150 mg + 75 mg daily) 08/18/20   Norman Clay, MD      Allergies    Shellfish allergy, Bactrim [sulfamethoxazole-trimethoprim], and Chocolate flavor    Review of Systems   Review of Systems  All other systems reviewed and are negative.   Physical Exam Updated Vital Signs BP (!) 172/102   Pulse 86   Temp 98.4 F (36.9 C) (Oral)   Resp 18   Ht '5\' 3"'$  (1.6 m)   Wt 90.7 kg   LMP 06/11/2013   SpO2 100%   BMI 35.43 kg/m  Physical Exam Vitals and nursing note reviewed.  Constitutional:      General: She is not in acute distress.    Appearance: She is well-developed.  HENT:     Head: Normocephalic and atraumatic.  Eyes:     Conjunctiva/sclera: Conjunctivae normal.  Cardiovascular:     Rate and Rhythm: Normal rate and regular rhythm.     Heart sounds: No murmur heard. Pulmonary:     Effort: Pulmonary effort is normal. No respiratory distress.     Breath sounds: Normal breath sounds.  Abdominal:     Palpations: Abdomen is soft.     Tenderness: There is no abdominal tenderness.  Musculoskeletal:        General: No swelling.     Cervical back: Neck supple.     Right lower leg: No edema.     Left lower leg: No edema.     Comments: Patient has full active range of motion of bilateral hip, knee, ankle, lower extremity digits, shoulder, elbows, wrists, hands bilaterally.  Mild swelling noted over bilateral knee, ankle, wrists.  No obvious signs of erythema, breaks in skin.  Muscle strength 5 out of 5 bilateral upper and lower extremities.  Patient planing no sensory deficits along major nerve distributions of upper and lower extremities.  DTR symmetric and equal bilaterally.  Compartment soft and supple.  Skin:    General: Skin is warm and dry.     Capillary Refill: Capillary refill takes less than 2 seconds.  Neurological:     Mental Status: She is alert.  Psychiatric:        Mood and Affect: Mood normal.     ED Results / Procedures / Treatments   Labs (all labs  ordered are listed, but only abnormal results are displayed) Labs Reviewed  COMPREHENSIVE METABOLIC PANEL - Abnormal; Notable for the following components:      Result Value   Potassium 2.7 (*)    Creatinine, Ser 1.27 (*)    GFR, Estimated 52 (*)    All other components within normal limits  CBC WITH DIFFERENTIAL/PLATELET - Abnormal; Notable for the following components:   Hemoglobin 11.4 (*)    HCT 35.8 (*)    All other components within normal limits  MAGNESIUM    EKG EKG Interpretation  Date/Time:  Monday July 09 2022 16:29:32 EDT Ventricular Rate:  87 PR Interval:  165 QRS Duration: 100 QT Interval:  390 QTC Calculation: 470 R Axis:   36 Text Interpretation: Sinus rhythm Low voltage, precordial leads Confirmed by Nanda Quinton [phone]) on 07/09/2022 4:41:38 PM  Radiology No results found.  Procedures Procedures    Medications Ordered in ED Medications  potassium chloride 10 mEq in 100 mL IVPB (10 mEq Intravenous New Bag/Given 07/09/22 1629)  ketorolac (TORADOL) 30 MG/ML injection 30 mg (30 mg Intramuscular Given 07/09/22 1449)  potassium chloride SA (KLOR-CON M) CR tablet 40 mEq (40 mEq Oral Given 07/09/22 1619)  sodium chloride 0.9 % bolus 1,000 mL (1,000 mLs Intravenous New Bag/Given 07/09/22 1629)    ED Course/ Medical Decision Making/ A&P                           Medical Decision Making Amount and/or Complexity of Data Reviewed Labs: ordered.  Risk Prescription drug management.   This patient presents to the ED for concern of joint pain, this involves an extensive number of treatment options, and is a complaint that carries with it a high risk of complications and morbidity.  The differential diagnosis includes rheumatoid nephritis, osteoarthritis, Reiter's syndrome, septic arthritis, necrotizing fasciitis, compartment syndrome, Lyme disease, [place] spotted fever   Co morbidities that complicate the patient evaluation  See HPI   Additional history  obtained:  Additional history obtained from EMR External records from outside source obtained and reviewed including note from rheumatology from office visit from 05/31/2022   Lab Tests:  I Ordered, and personally interpreted labs.  The pertinent results include: No leukocytosis noted.  Mild anemia with a hemoglobin 11.4 which is per patient's baseline from prior labs.  No platelets abnormality.  Potassium of 2.7 which is supplemented orally as well as 10 mill) IV.  Patient has at home potassium 4 known hypokalemia but has not taken it in the past several weeks.  Magnesium 2.2.   Imaging Studies ordered:  N/a   Cardiac Monitoring: / EKG:  The patient was maintained on a cardiac monitor.  I personally viewed and interpreted the cardiac monitored which showed an underlying rhythm of: Sinus rhythm   Consultations Obtained:  N/a   Problem List / ED Course / Critical interventions / Medication management  polyarthralgia I ordered medication including Toradol for pain.  40 mill colons of potassium orally as well as 10 mill equivalents IV for potassium replenished. Reevaluation of the patient after these medicines showed that the patient improved I have reviewed the patients home medicines and have made adjustments as needed   Social Determinants of Health:  Denies alcohol, illicit drug, tobacco use.   Test / Admission - Considered:  Vitals signs significant for hypertension with blood pressure 151/87.  Recommend close follow-up with PCP regarding elevation of blood pressure.. Otherwise within normal range and stable throughout visit. Laboratory studies significant for: See above Imaging studies considered but deemed unnecessary due to lack of traumatic mechanism as well as known associated pain with prior rheumatoid arthritic flares up.  Patient responded well to Toradol in the emergency department.  As requested, she will be prescribed Mobic to take daily until she is seen by her  rheumatologist tomorrow for further medical guidance.  Patient also had evidence of hypokalemia with a potassium of 2.7 which is supplemented orally while in the emergency department; she has a known history of hypokalemia and has not been taking her oral potassium at home for the past several weeks.  Recommend continued at home potassium supplementation as well as repeat laboratory studies in 1 week's time by her primary care provider.  No further laboratory or imaging studies deemed necessary at this time.  Treatment plan was discussed at length with patient and she knowledge understanding was agreeable to said plan. Worrisome signs and symptoms were discussed with the patient, and the patient acknowledged understanding to return to the ED if noticed. Patient was stable upon discharge.          Final Clinical Impression(s) / ED Diagnoses Final diagnoses:  Polyarthralgia  Hypokalemia    Rx / DC Orders ED Discharge Orders          Ordered    meloxicam (MOBIC) 15 MG tablet  Daily        07/09/22 1531              Wilnette Kales, [place] 07/09/22 1648    Margette Fast, MD 07/12/22 [phone]

## 2022-07-09 NOTE — ED Triage Notes (Signed)
Patient c/o bilateral feet, hands, and knee pain. Patient reports a history of rheumatoid arthritis and thinks she is having a "flare-up." Patient states she is scheduled for an infusion tomorrow.

## 2022-07-09 NOTE — ED Notes (Signed)
Critical K+ 2.7. Provider notified.

## 2022-10-19 ENCOUNTER — Telehealth: Payer: Self-pay | Admitting: Neurology

## 2022-10-19 MED ORDER — OXCARBAZEPINE 300 MG PO TABS: ORAL_TABLET | ORAL | 5 refills | Status: DC

## 2022-10-19 NOTE — Telephone Encounter (Signed)
Her trigeminal neuralgia is flaring up.  In the past she had been on the combination of gabapentin with Tegretol.  I will send in oxcarbazepine 300 mg p.o. 3 times daily and she can increase her gabapentin to 3 times a day during the flare.  She has not been seen in almost a year so I recommended that she call the office next week to make an appointment.

## 2022-10-19 NOTE — Telephone Encounter (Signed)
Pt said, Trigeminal Neuralgia has flare up. Left side of face starting from ear to eye and including cheeks and gums. Very painful and swollen. Taking Gabapentin every day 600 mg twice a day, but is not helping.  Would like call from the on call physician to discuss what to do.

## 2022-10-22 ENCOUNTER — Encounter: Payer: Self-pay | Admitting: Neurology

## 2022-10-22 ENCOUNTER — Telehealth: Payer: Self-pay | Admitting: Neurology

## 2022-10-22 NOTE — Telephone Encounter (Signed)
Pt returned a call from nurse. She is requesting a call back.

## 2022-10-22 NOTE — Telephone Encounter (Signed)
I called pt. No answer, left a message asking pt to call me back.   

## 2022-10-22 NOTE — Telephone Encounter (Signed)
Monitoring for sooner appt, I have already been notified of sooner appt needed by [facility].

## 2022-10-22 NOTE — Telephone Encounter (Signed)
Pt is calling. Stated she needed an appointment. I scheduled her for 2/29 @ 7:45 am then she stated  no I need something sooner because I'm in a lot of pain with Trigeminal neuralgia. Pt is requesting a call back from nurse.

## 2022-10-23 NOTE — Telephone Encounter (Signed)
Per Judson Roch, NP: " Please call the patient, historically Dr. Jannifer Franklin had some question of if she had true trigeminal neuralgia versus SUNCT type headache. Copied Dr. Jannifer Franklin 2020 (The patient is being followed through this office for right-sided facial pain in the V2 distribution.  This may represent trigeminal neuralgia, but the patient does report some occasional watering of the right eye, and a SUNCT type headache may need to be considered as well). We can increase the gabapentin 600 mg 3 times daily (her message indicates taking 300 mg TID, Trileptal 300 mg TID).  If any new symptoms could be seen as new consult? I do not have any appointments open this week.    I called patient.  She reports feeling marginally better this morning.  She will increase the gabapentin to 600 mg 3 times daily.  She will continue the oxcarbazepine 300 mg 3 times daily.  She would like an appointment with a MD here per Judson Roch, NP's recommendation.  I scheduled her with Dr. [place] for December 21 at 3:30 PM.  Patient verbalized understanding of new appointment date and time.  I cautioned her that the increase in gabapentin may cause her drowsiness and advised that she not drive or operate heavy machinery if she is sleepy.  Patient verbalized understanding.

## 2022-10-23 NOTE — Telephone Encounter (Signed)
See telephone note from 10/23/22. 

## 2022-10-24 NOTE — Progress Notes (Unsigned)
Referring:  Shanon Rosser, [name] [place],  [place] [address]  PCP: Shanon Rosser, [name]  Neurology was asked to evaluate Joanne Garrett, a 46 year old female for a chief complaint of facial pain  Our recommendations of care will be communicated by shared medical record.    CC:  facial pain  History provided from self  HPI:  Medical co-morbidities: rheumatoid arthritis, PTSD, breast cancer, HTN  The patient presents for evaluation of facial pain which began in 2019. She previously followed with Dr. Jannifer Franklin, and was taking gabapentin 600 mg BID and carbamazepine 200 mg BID for trigeminal neuralgia vs SUNCT. She had been off of these medications for several months in 2022, then was restarted on gabapentin 300 mg BID in January 2023.   Did well until December of this year when her facial pain began to flare. This time her pain was on the left side of her face. She is no longer having pain on the right side. She was started on carbamazepine 300 mg TID and her gabapentin was increased to 300 mg TID last week. Pain has improved significantly since her medication adjustments, but she will still have pain flares with touching her face. Pain is primarily in V2 and V3 though she will occasionally have eye pain as well. She will have lacrimation of her left eye, but no eye redness, rhinorrhea, or nasal congestion.  FACIAL PAIN FEATURES  Side: left Distribution: V1, V2, V3 Any pain on side or back of head:  Character: stabbing, burning Duration: several seconds-1 minute, can come on and off all day Pain-free between episodes: yes Triggers: talking, eating, brushing teeth Sensory abnormalities: no Autonomic symptoms: ipsilateral lacrimation Tried Tegretol/Trileptal: carbamazepine Prior procedures/outcome: none History of MS, Lyme's disease, facial rash: no History of dental/oral surgery, facial/plastic surgery: none  Current Treatment: Carbamazepine 300 mg TID Gabapentin  300 mg TID  Prior Therapies                                 Carbamazepine 300 mg TID Gabapentin 600 mg BID   LABS: CBC    Component Value Date/Time   WBC 10.4 07/09/2022 1415   RBC 4.24 07/09/2022 1415   HGB 11.4 (L) 07/09/2022 1415   HGB 12.7 05/31/2020 0924   HCT 35.8 (L) 07/09/2022 1415   HCT 39.7 05/31/2020 0924   PLT 252 07/09/2022 1415   PLT 224 05/31/2020 0924   MCV 84.4 07/09/2022 1415   MCV 86 05/31/2020 0924   MCH 26.9 07/09/2022 1415   MCHC 31.8 07/09/2022 1415   RDW 14.1 07/09/2022 1415   RDW 14.0 05/31/2020 0924   LYMPHSABS 1.9 07/09/2022 1415   LYMPHSABS 2.3 05/31/2020 0924   MONOABS 0.9 07/09/2022 1415   EOSABS 0.2 07/09/2022 1415   EOSABS 0.2 05/31/2020 0924   BASOSABS 0.1 07/09/2022 1415   BASOSABS 0.1 05/31/2020 0924      Latest Ref Rng & Units 07/09/2022    2:15 PM 08/15/2020   10:31 AM 08/13/2020    4:04 PM  CMP  Glucose 70 - 99 mg/dL 93  85  95   BUN 6 - 20 mg/dL '18  8  12   '$ Creatinine 0.44 - 1.00 mg/dL 1.27  0.73  0.91   Sodium 135 - 145 mmol/L 142  143  139   Potassium 3.5 - 5.1 mmol/L 2.7  3.5  3.4   Chloride 98 - 111 mmol/L  103  101  100   CO2 22 - 32 mmol/L '29  29  26   '$ Calcium 8.9 - 10.3 mg/dL 9.6  9.1  8.9   Total Protein 6.5 - 8.1 g/dL 7.7  7.4  7.2   Total Bilirubin 0.3 - 1.2 mg/dL 0.7  0.6  0.5   Alkaline Phos 38 - 126 U/L 63  86  96   AST 15 - 41 U/L '17  22  27   '$ ALT 0 - 44 U/L 17  48  54      IMAGING:  MRI brain 2021: nonspecific white matter changes, otherwise unremarkable  MRI brain 2019: unremarkable  Imaging independently reviewed on October 25, 2022   Current Outpatient Medications on File Prior to Visit  Medication Sig Dispense Refill   albuterol (VENTOLIN HFA) 108 (90 Base) MCG/ACT inhaler 2 puffs every 6 (six) hours as needed for wheezing or shortness of breath.     amLODipine (NORVASC) 10 MG tablet Take 1 tablet (10 mg total) by mouth daily. 30 tablet 0   clindamycin (CLEOCIN T) 1 % lotion 1 application. daily.      folic acid (FOLVITE) 1 MG tablet Take 1 mg by mouth daily.     gabapentin (NEURONTIN) 300 MG capsule Take 1 capsule (300 mg total) by mouth 2 (two) times daily. 60 capsule 5   LORazepam (ATIVAN) 0.5 MG tablet 0.25-0.5 mg daily as needed for anxiety 30 tablet 2   Multiple Vitamin (MULTIVITAMIN) tablet Take 1 tablet by mouth daily.     potassium chloride SA (K-DUR) 20 MEQ tablet TAKE 2 TABLETS BY MOUTH FOR 7 DAYS THEN 1 TABLET DAILY (Patient taking differently: Take 20 mEq by mouth 2 (two) times daily.) 30 tablet 0   valACYclovir (VALTREX) 1000 MG tablet Take 1 tablet (1,000 mg total) by mouth 2 (two) times daily as needed. X 5 days during an outbreak (Patient taking differently: Take 1,000 mg by mouth 2 (two) times daily as needed (for 5 days during an outbreak).) 30 tablet 2   venlafaxine XR (EFFEXOR-XR) 150 MG 24 hr capsule Take total of 225 mg daily (150 mg + 75 mg daily) (Patient taking differently: 150 mg daily with breakfast. Take total of 225 mg daily (150 mg + 75 mg daily)) 90 capsule 1   venlafaxine XR (EFFEXOR-XR) 75 MG 24 hr capsule 225 mg daily (150 mg + 75 mg daily) (Patient taking differently: 75 mg daily with breakfast. 225 mg daily (150 mg + 75 mg daily)) 90 capsule 1   HYDROcodone-acetaminophen (NORCO) 5-325 MG tablet Take 1 tablet by mouth every 4 (four) hours as needed. (Patient not taking: Reported on 10/25/2022) 12 tablet 0   LORazepam (ATIVAN) 1 MG tablet Take 1 mg by mouth daily as needed for anxiety. (Patient not taking: Reported on 10/25/2022)     meclizine (ANTIVERT) 25 MG tablet Take 1 tablet (25 mg total) by mouth 2 (two) times daily as needed for dizziness. (Patient not taking: Reported on 04/01/2022) 30 tablet 0   meloxicam (MOBIC) 15 MG tablet Take 1 tablet (15 mg total) by mouth daily. (Patient not taking: Reported on 10/25/2022) 30 tablet 0   methylPREDNISolone (MEDROL DOSEPAK) 4 MG TBPK tablet Follow package directions (Patient not taking: Reported on 10/25/2022) 21  tablet 0   predniSONE (DELTASONE) 10 MG tablet Take Q day 6,5,4,3,2,1 (Patient not taking: Reported on 10/25/2022) 21 tablet 0   No current facility-administered medications on file prior to visit.  Allergies: Allergies  Allergen Reactions   Shellfish Allergy Itching and Other (See Comments)    HIVES IF GOES OVERBOARD, HAS HAD IV DYE AND HAD NO PROBLEMS   Bactrim [Sulfamethoxazole-Trimethoprim] Hives and Other (See Comments)   Chocolate Flavor Itching and Other (See Comments)    PLAIN CHOCOLATE IN ABUNDANCE    Family History: Family History  Problem Relation Age of Onset   Hypertension Mother    COPD Mother    Depression Mother    Hypertension Father    Kidney disease Father    Alcohol abuse Father    Hypertension Brother    Alzheimer's disease Paternal Grandmother     Past Medical History: 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([place]) onologist-- dr sorscher w/ [facility]   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 and completed radiation 09-30-2012 and completed anti-estrogen therapy   Depression    Genital herpes    Hematuria    History of hyperthyroidism 02/2001   s/p  RAI 03-2001   History of panic attacks    History of radiation therapy    completed 09-23-2012  50Gy in 25 fractions and boost completed [phone]   History of thyroid nodule    Hypertension    Renal calculus, right    Rheumatoid arthritis ([place])    Trigeminal neuralgia of right side of face 04/15/2019   V2   Urgency of urination     Past Surgical History Past Surgical History:  Procedure Laterality Date   ABDOMINAL HYSTERECTOMY     ANTERIOR CERVICAL DECOMP/DISCECTOMY FUSION  05-18-2015   [facility]   BREAST LUMPECTOMY Left 07-03-2012    [facility]   CYSTOSCOPY WITH RETROGRADE PYELOGRAM, URETEROSCOPY AND STENT PLACEMENT Right 09/09/2017   Procedure: CYSTOSCOPY WITH RIGHT RETROGRADE PYELOGRAM, URETEROSCOPY , STONE BASKETRY AND STENT PLACEMENT;  Surgeon:  Lucas Mallow, MD;  Location: [place];  Service: Urology;  Laterality: Right;   IR TRANSCATHETER BX  02/02/2020   IR US GUIDE VASC ACCESS RIGHT  02/02/2020   IR VENOGRAM HEPATIC W HEMODYNAMIC EVALUATION  02/02/2020   KNEE ARTHROSCOPY Left 2009   TOTAL ABDOMINAL HYSTERECTOMY W/ BILATERAL SALPINGOOPHORECTOMY  2014    Social History: Social History   Tobacco Use   Smoking status: Never   Smokeless tobacco: Never  Vaping Use   Vaping Use: Never used  Substance Use Topics   Alcohol use: No   Drug use: No    ROS: Negative for fevers, chills. Positive for facial pain. All other systems reviewed and negative unless stated otherwise in HPI.   Physical Exam:   Vital Signs: BP (!) 167/101   Pulse 87   Ht '5\' 3"'$  (1.6 m)   Wt 198 lb 9.6 oz (90.1 kg)   LMP 06/11/2013   BMI 35.18 kg/m  GENERAL: well appearing,in no acute distress,alert SKIN:  Color, texture, turgor normal. No rashes or lesions HEAD:  Normocephalic/atraumatic. CV:  RRR RESP: Normal respiratory effort MSK: no tenderness to palpation over occiput, neck, or shoulders  NEUROLOGICAL: Mental Status: Alert, oriented to person, place and time,Follows commands Cranial Nerves: PERRL, visual fields intact to confrontation, extraocular movements intact, facial sensation intact, no facial droop or ptosis, hearing grossly intact, no dysarthria Motor: muscle strength 5/5 both upper and lower extremities Reflexes: 2+ throughout Sensation: intact to light touch all 4 extremities Coordination: Finger-to- nose-finger intact bilaterally Gait: normal-based   IMPRESSION: 46 year old female with  a history of rheumatoid arthritis, PTSD, breast cancer, HTN who presents for evaluation of left facial pain. While she does have ipsilateral lacrimation, lack of other autonomic symptoms and predominance of pain in V2-V3 makes trigeminal neuralgia the most likely diagnosis. She has responded well to TN medications though she  does still have some residual pain. Will continue gabapentin and increase oxcarbazepine to 300/300/600 (patient prefers TID dosing). Baclofen prescribed as needed for breakthrough pain.  PLAN: -Increase oxcarbazepine to 300/300/600, may uptitrate up to 1.8 g/day if needed -Continue gabapentin 300 mg TID -Start baclofen 5-10 mg TID PRN for breakthrough pain -Next steps: consider Lamictal if pain does not improve with uptitration of medications  I spent a total of 31 minutes chart reviewing and counseling the patient. Headache education was done. Discussed treatment options including preventive and acute medications. Discussed medication side effects, adverse reactions and drug interactions. Written educational materials and patient instructions outlining all of the above were given.  Follow-up: scheduled for February 2024   Genia Harold, MD 10/25/2022   4:10 PM

## 2022-10-25 ENCOUNTER — Ambulatory Visit: Payer: 59 | Admitting: Psychiatry

## 2022-10-25 ENCOUNTER — Encounter: Payer: Self-pay | Admitting: Psychiatry

## 2022-10-25 VITALS — BP 167/101 | HR 87 | Ht 63.0 in | Wt 198.6 lb

## 2022-10-25 DIAGNOSIS — G500 Trigeminal neuralgia: Secondary | ICD-10-CM | POA: Diagnosis not present

## 2022-10-25 MED ORDER — BACLOFEN 10 MG PO TABS: ORAL_TABLET | ORAL | 6 refills | Status: DC

## 2022-10-25 MED ORDER — OXCARBAZEPINE 300 MG PO TABS: ORAL_TABLET | ORAL | 6 refills | Status: DC

## 2022-10-25 NOTE — Patient Instructions (Signed)
Increase oxcarbazepine to 1 pill in the AM, 1 pill in the afternoon, and 2 pills at night  Take baclofen as needed for facial pain flares. Take 1/2 to 1 pill up to 3 times a day as needed for facial pain

## 2023-01-03 ENCOUNTER — Ambulatory Visit: Payer: 59 | Admitting: Neurology

## 2023-04-04 ENCOUNTER — Encounter (HOSPITAL_COMMUNITY): Payer: Self-pay

## 2023-04-04 ENCOUNTER — Emergency Department (HOSPITAL_COMMUNITY)
Admission: EM | Admit: 2023-04-04 | Discharge: 2023-04-05 | Disposition: A | Discharge status: Home / Self Care | Payer: 59 | Attending: Emergency Medicine | Admitting: Emergency Medicine

## 2023-04-04 DIAGNOSIS — Z853 Personal history of malignant neoplasm of breast: Secondary | ICD-10-CM | POA: Insufficient documentation

## 2023-04-04 DIAGNOSIS — I10 Essential (primary) hypertension: Secondary | ICD-10-CM | POA: Insufficient documentation

## 2023-04-04 DIAGNOSIS — Z79899 Other long term (current) drug therapy: Secondary | ICD-10-CM | POA: Diagnosis not present

## 2023-04-04 DIAGNOSIS — M25551 Pain in right hip: Secondary | ICD-10-CM | POA: Diagnosis present

## 2023-04-04 DIAGNOSIS — M5417 Radiculopathy, lumbosacral region: Secondary | ICD-10-CM

## 2023-04-04 NOTE — ED Triage Notes (Signed)
Pt presents to ED for evaluation of sharp right hip pain radiating down her leg that began this AM.

## 2023-04-05 ENCOUNTER — Emergency Department (HOSPITAL_COMMUNITY): Payer: 59

## 2023-04-05 MED ORDER — PREDNISONE 50 MG PO TABS: 50.0000 mg | ORAL_TABLET | Freq: Every day | ORAL | 0 refills | Status: DC

## 2023-04-05 MED ORDER — PREDNISONE 20 MG PO TABS
60.0000 mg | ORAL_TABLET | Freq: Once | ORAL | Status: AC
  Administered 2023-04-05: 60 mg via ORAL
  Filled 2023-04-05: qty 3

## 2023-04-05 NOTE — Discharge Instructions (Signed)

## 2023-04-05 NOTE — ED Provider Notes (Signed)
[place] EMERGENCY DEPARTMENT AT [facility] Provider Note   CSN: [id number] Arrival date & time: 04/04/23  2333     History  Chief Complaint  Patient presents with   Hip Pain    Joanne Garrett is a 47 y.o. female.  The history is provided by the patient.  Patient with history of rheumatoid arthritis, distant history of breast cancer presents with right hip and back pain. Patient reports that started this morning after she got up.  No recent falls or trauma.  No significant heavy lifting.  The pain starts in her right buttock and radiates down to her right leg.  She reports a burning type pain.  No new weakness but it hurts to walk.  She also reports pain in the right hip.  No abdominal pain.  No urinary or fecal incontinence is reported She reports she is on methotrexate for her RA.  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 Authorizing Provider  albuterol (VENTOLIN HFA) 108 (90 Base) MCG/ACT inhaler 2 puffs every 6 (six) hours as needed for wheezing or shortness of breath. 10/13/21   [provider]  amLODipine (NORVASC) 10 MG tablet Take 1 tablet (10 mg total) by mouth  daily. 12/26/20   Arby Barrette, MD  baclofen (LIORESAL) 10 MG tablet Take 1/2 to 1 pill up to 3 times a day as needed for facial pain 10/25/22   Ocie Doyne, MD  clindamycin (CLEOCIN T) 1 % lotion 1 application. daily. 03/05/22   [provider]  folic acid (FOLVITE) 1 MG tablet Take 1 mg by mouth daily. 10/20/18   [provider]  gabapentin (NEURONTIN) 300 MG capsule Take 1 capsule (300 mg total) by mouth 2 (two) times daily. 11/15/21   Glean Salvo, NP  LORazepam (ATIVAN) 0.5 MG tablet 0.25-0.5 mg daily as needed for anxiety 10/17/20   Neysa Hotter, MD  Multiple Vitamin (MULTIVITAMIN) tablet Take 1 tablet by mouth daily.    [provider]  Oxcarbazepine (TRILEPTAL) 300 MG tablet Take 1 pill in the morning, 1 pill in the afternoon, and 2 pills at night 10/25/22   Ocie Doyne, MD  potassium chloride SA (K-DUR) 20 MEQ tablet TAKE 2 TABLETS BY MOUTH FOR 7 DAYS THEN 1 TABLET DAILY Patient taking differently: Take 20 mEq by mouth 2 (two) times daily. 03/09/19   Henson, Vickie L, NP-C  valACYclovir (VALTREX) 1000 MG tablet Take 1 tablet (1,000 mg total) by mouth 2 (two) times daily as needed. X 5 days during an outbreak Patient taking differently: Take  1,000 mg by mouth 2 (two) times daily as needed (for 5 days during an outbreak). 08/15/18   Henson, Vickie L, NP-C  venlafaxine XR (EFFEXOR-XR) 150 MG 24 hr capsule Take total of 225 mg daily (150 mg + 75 mg daily) Patient taking differently: 150 mg daily with breakfast. Take total of 225 mg daily (150 mg + 75 mg daily) 08/18/20   Hisada, Barbee Cough, MD  venlafaxine XR (EFFEXOR-XR) 75 MG 24 hr capsule 225 mg daily (150 mg + 75 mg daily) Patient taking differently: 75 mg daily with breakfast. 225 mg daily (150 mg + 75 mg daily) 08/18/20   Neysa Hotter, MD      Allergies    Shellfish allergy, Bactrim [sulfamethoxazole-trimethoprim], and Chocolate flavor    Review of Systems   Review of Systems  Constitutional:  Negative  for fever.  Musculoskeletal:  Positive for arthralgias and back pain.    Physical Exam Updated Vital Signs BP (!) 172/85 (BP Location: Right Arm)   Pulse (!) 107   Temp 98.4 F (36.9 C) (Oral)   Resp 18   Ht 1.6 m (5\' 3" )   Wt 90.7 kg   LMP 06/11/2013   SpO2 96%   BMI 35.43 kg/m  Physical Exam CONSTITUTIONAL: Well developed/well nourished HEAD: Normocephalic/atraumatic EYES: EOMI/PERRL ENMT: Mucous membranes moist NECK: supple no meningeal signs SPINE/BACK:entire spine nontender lumbar paraspinal tenderness No erythema or bruising noted to her back ABDOMEN: soft, nontender, no rebound or guarding GU:no cva tenderness NEURO: Awake/alert,  equal motor 5/5 strength noted with the following: hip flexion/knee flexion/extension, foot dorsi/plantar flexion, great toe extension intact bilaterally,  no sensory deficit in any dermatome.  Pt is able to ambulate unassisted. EXTREMITIES: pulses normal, full ROM Tenderness to palpation and range of motion of right hip. No bruising or erythema over the right buttock.  No bruising noted to the right hip.  No lymphadenopathy or swelling to the right groin.  Female chaperone present SKIN: warm, color normal PSYCH: no abnormalities of mood noted, alert and oriented to situation  ED Results / Procedures / Treatments   Labs (all labs ordered are listed, but only abnormal results are displayed) Labs Reviewed - No data to display  EKG None  Radiology DG Hip Unilat W or Wo Pelvis 2-3 Views Right  Result Date: 04/05/2023 CLINICAL DATA:  Back pain and right hip pain. EXAM: DG HIP (WITH OR WITHOUT PELVIS) 2-3V RIGHT COMPARISON:  Similar study of 12/15/2017 FINDINGS: There is no evidence of hip fracture or dislocation. Bone mineralization is normal. 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 There is advanced  facet hypertrophy at L4-5 and L5-S1. Compare: Unchanged. IMPRESSION: 1. 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 2. No evidence of fractures or other acute radiographic findings. 3. Advanced facet hypertrophy at L4-5 and L5-S1. Electronically Signed   By: Almira Bar M.D.   On: 04/05/2023 01:11   DG Lumbar Spine Complete  Result Date: 04/05/2023 CLINICAL DATA:  Low back pain, right hip anterior and posterior sciatica pain EXAM: LUMBAR SPINE - COMPLETE 5 VIEW COMPARISON:  No prior lumbar spine radiographs available, correlation is made with 08/15/2020 CT abdomen pelvis FINDINGS: There is no evidence of lumbar spine fracture. Alignment is normal. Lower lumbar facet arthropathy. Intervertebral disc spaces are maintained. IMPRESSION: Lower lumbar  facet arthropathy. No acute findings. Electronically Signed   By: Wiliam Ke M.D.   On: 04/05/2023 01:10    Procedures Procedures    Medications Ordered in ED Medications  predniSONE (DELTASONE) tablet 60 mg (has no administration in time range)    ED Course/ Medical Decision Making/ A&P                             Medical Decision Making Amount and/or Complexity of Data Reviewed Radiology: ordered.  Risk Prescription drug management.   Patient presents with atraumatic low back pain and pain into her right hip.  She does have a history of RA, but no recent trauma.  She is able to ambulate.  Strong suspicion this represents lumbosacral radiculopathy, but does have pain in the hip.  No signs of any acute fracture when I reviewed the x-ray.  She has no neurodeficits to suggest acute spinal emergency. Pain declines pain medicines.  However she is amenable to starting a steroid burst for her symptoms.  Patient is safe for outpatient management.         Final Clinical Impression(s) / ED Diagnoses Final diagnoses:  Right hip pain  Lumbosacral  radiculopathy    Rx / DC Orders ED Discharge Orders     None         Zadie Rhine, MD 04/05/23 0145

## 2023-04-17 DIAGNOSIS — Z79899 Other long term (current) drug therapy: Secondary | ICD-10-CM | POA: Insufficient documentation

## 2023-04-29 ENCOUNTER — Other Ambulatory Visit: Payer: Self-pay

## 2023-04-29 MED ORDER — GABAPENTIN 300 MG PO CAPS: 300.0000 mg | ORAL_CAPSULE | Freq: Two times a day (BID) | ORAL | 5 refills | Status: AC

## 2023-04-29 NOTE — Telephone Encounter (Signed)
Requested Prescriptions   Pending Prescriptions Disp Refills   gabapentin (NEURONTIN) 300 MG capsule 60 capsule 5    Sig: Take 1 capsule (300 mg total) by mouth 2 (two) times daily.   Last seen 10/25/22, next appt not scheduled

## 2023-07-14 ENCOUNTER — Encounter (HOSPITAL_COMMUNITY): Payer: Self-pay | Admitting: Emergency Medicine

## 2023-07-14 ENCOUNTER — Ambulatory Visit (HOSPITAL_COMMUNITY)
Admission: EM | Admit: 2023-07-14 | Discharge: 2023-07-14 | Disposition: A | Discharge status: Home / Self Care | Payer: 59 | Attending: Physician Assistant | Admitting: Physician Assistant

## 2023-07-14 ENCOUNTER — Other Ambulatory Visit: Payer: Self-pay

## 2023-07-14 DIAGNOSIS — M25561 Pain in right knee: Secondary | ICD-10-CM

## 2023-07-14 MED ORDER — PREDNISONE 20 MG PO TABS
40.0000 mg | ORAL_TABLET | Freq: Every day | ORAL | 0 refills | Status: AC
Start: 2023-07-14 — End: 2023-07-19

## 2023-07-14 NOTE — ED Triage Notes (Signed)
Right knee pain for 3 days, no known injury.    Knee is swollen.  Alternating tylenol and ibuprofen

## 2023-07-14 NOTE — ED Provider Notes (Signed)
[facility]    CSN: [id number] Arrival date & time: 07/14/23  1340      History   Chief Complaint Chief Complaint  Patient presents with   Knee Pain    HPI Joanne Garrett is a 47 y.o. female.   Patient here today for evaluation of right knee pain that started 3 days ago.  She denies any known injury.  She notes that she does have some swelling to her knee.  She has tried taking ibuprofen and Tylenol without resolution of symptoms.  She does have known rheumatoid arthritis.  She does not report any numbness or tingling.  She denies concerns for STDs.  The history is provided by the patient.  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 and completed radiation 09-30-2012 and completed anti-estrogen therapy   Depression    Genital herpes    Hematuria    History of hyperthyroidism 02/2001   s/p  RAI 03-2001   History of panic attacks    History of radiation therapy    completed 09-23-2012  50Gy in 25 fractions and boost completed [phone]   History of thyroid nodule    Hypertension    Renal calculus, right    Rheumatoid arthritis (HCC)    Trigeminal neuralgia of right side of face 04/15/2019   V2   Urgency of urination     Patient Active Problem List   Diagnosis Date Noted   Weakness 05/31/2020   Snoring 05/20/2019   Insomnia 05/20/2019   Trigeminal neuralgia of right side of face 04/15/2019   MDD (major depressive disorder), recurrent, 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   Obesity (BMI 30.0-34.9) 01/31/2018   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   Rheumatoid arthritis (HCC)    PTSD (post-traumatic stress disorder) 09/17/2017   MDD (major depressive disorder), recurrent episode, moderate (HCC) 09/17/2017   HTN (hypertension) 01/16/2017   Genital herpes 01/16/2017    History of thyroid disorder 01/16/2017   Left thyroid nodule 01/16/2017   Breast pain, left 01/16/2017   History of breast cancer 01/16/2017    Past Surgical History:  Procedure Laterality Date   ABDOMINAL HYSTERECTOMY     ANTERIOR CERVICAL DECOMP/DISCECTOMY FUSION  05-18-2015   [facility]   BREAST LUMPECTOMY Left 07-03-2012    [facility]   CYSTOSCOPY WITH RETROGRADE PYELOGRAM, URETEROSCOPY AND STENT PLACEMENT Right 09/09/2017   Procedure: CYSTOSCOPY WITH RIGHT RETROGRADE PYELOGRAM, URETEROSCOPY , STONE BASKETRY AND STENT PLACEMENT;  Surgeon: Crista Elliot, MD;  Location: [facility];  Service: Urology;  Laterality: Right;   IR TRANSCATHETER BX  02/02/2020   IR US GUIDE VASC ACCESS RIGHT  02/02/2020   IR VENOGRAM HEPATIC W HEMODYNAMIC EVALUATION  02/02/2020   KNEE ARTHROSCOPY Left 2009   TOTAL ABDOMINAL HYSTERECTOMY W/ BILATERAL SALPINGOOPHORECTOMY  2014    OB History   No obstetric history on file.      Home Medications    Prior to Admission medications   Medication Sig Start Date End Date Taking? Authorizing Provider  albuterol (VENTOLIN HFA) 108 (90 Base) MCG/ACT inhaler 2 puffs every 6 (six) hours as needed for wheezing or shortness of breath. 10/13/21   [provider]  amLODipine (NORVASC) 10 MG tablet Take 1 tablet (10 mg total) by mouth daily. 12/26/20  Arby Barrette, MD  baclofen (LIORESAL) 10 MG tablet Take 1/2 to 1 pill up to 3 times a day as needed for facial pain 10/25/22   Ocie Doyne, MD  clindamycin (CLEOCIN T) 1 % lotion 1 application. daily. 03/05/22   [provider]  folic acid (FOLVITE) 1 MG tablet Take 1 mg by mouth daily. 10/20/18   [provider]  gabapentin (NEURONTIN) 300 MG capsule Take 1 capsule (300 mg total) by mouth 2 (two) times daily. 04/29/23   Ocie Doyne, MD  LORazepam (ATIVAN) 0.5 MG tablet 0.25-0.5 mg daily as needed for anxiety 10/17/20   Neysa Hotter, MD  Multiple Vitamin (MULTIVITAMIN) tablet Take  1 tablet by mouth daily.    [provider]  Oxcarbazepine (TRILEPTAL) 300 MG tablet Take 1 pill in the morning, 1 pill in the afternoon, and 2 pills at night 10/25/22   Ocie Doyne, MD  potassium chloride SA (K-DUR) 20 MEQ tablet TAKE 2 TABLETS BY MOUTH FOR 7 DAYS THEN 1 TABLET DAILY Patient taking differently: Take 20 mEq by mouth 2 (two) times daily. 03/09/19   Henson, Vickie L, NP-C  valACYclovir (VALTREX) 1000 MG tablet Take 1 tablet (1,000 mg total) by mouth 2 (two) times daily as needed. X 5 days during an outbreak Patient taking differently: Take 1,000 mg by mouth 2 (two) times daily as needed (for 5 days during an outbreak). 08/15/18   Henson, Vickie L, NP-C  venlafaxine XR (EFFEXOR-XR) 150 MG 24 hr capsule Take total of 225 mg daily (150 mg + 75 mg daily) Patient taking differently: 150 mg daily with breakfast. Take total of 225 mg daily (150 mg + 75 mg daily) 08/18/20   Neysa Hotter, MD  venlafaxine XR (EFFEXOR-XR) 75 MG 24 hr capsule 225 mg daily (150 mg + 75 mg daily) Patient taking differently: 75 mg daily with breakfast. 225 mg daily (150 mg + 75 mg daily) 08/18/20   Neysa Hotter, MD    Family History Family History  Problem Relation Age of Onset   Hypertension Mother    COPD Mother    Depression Mother    Hypertension Father    Kidney disease Father    Alcohol abuse Father    Hypertension Brother    Alzheimer's disease Paternal Grandmother     Social History Social History   Tobacco Use   Smoking status: Never   Smokeless tobacco: Never  Vaping Use   Vaping status: Never Used  Substance Use Topics   Alcohol use: No   Drug use: No     Allergies   Shellfish allergy, Bactrim [sulfamethoxazole-trimethoprim], and Chocolate flavor   Review of Systems Review of Systems  Constitutional:  Negative for chills and fever.  Eyes:  Negative for discharge and redness.  Respiratory:  Negative for shortness of breath.   Gastrointestinal:  Negative for  abdominal pain, nausea and vomiting.  Musculoskeletal:  Positive for arthralgias and joint swelling.     Physical Exam Triage Vital Signs ED Triage Vitals [07/14/23 1513]  Encounter Vitals Group     BP      Systolic BP Percentile      Diastolic BP Percentile      Pulse      Resp      Temp      Temp src      SpO2      Weight      Height      Head Circumference      Peak Flow  Pain Score 8     Pain Loc      Pain Education      Exclude from Growth Chart    No data found.  Updated Vital Signs BP (!) 152/92 (BP Location: Left Arm) Comment: did not take medicine this morning Comment (BP Location): large cuff  Pulse 87   Temp 98.7 F (37.1 C) (Oral)   Resp 18   LMP 06/11/2013   SpO2 99%     Physical Exam Vitals and nursing note reviewed.  Constitutional:      General: She is not in acute distress.    Appearance: Normal appearance. She is not ill-appearing.  HENT:     Head: Normocephalic and atraumatic.  Eyes:     Conjunctiva/sclera: Conjunctivae normal.  Cardiovascular:     Rate and Rhythm: Normal rate.  Pulmonary:     Effort: Pulmonary effort is normal. No respiratory distress.  Musculoskeletal:     Comments: Mild diffuse swelling to right knee.  No significant tenderness to palpation.  No erythema or warmth appreciated.  Relatively normal range of motion  Neurological:     Mental Status: She is alert.  Psychiatric:        Mood and Affect: Mood normal.        Behavior: Behavior normal.        Thought Content: Thought content normal.      UC Treatments / Results  Labs (all labs ordered are listed, but only abnormal results are displayed) Labs Reviewed - No data to display  EKG   Radiology No results found.  Procedures Procedures (including critical care time)  Medications Ordered in UC Medications - No data to display  Initial Impression / Assessment and Plan / UC Course  I have reviewed the triage vital signs and the nursing  notes.  Pertinent labs & imaging results that were available during my care of the patient were reviewed by me and considered in my medical decision making (see chart for details).    Suspect likely flare of rheumatoid arthritis and will treat with steroid burst.  Recommended follow-up if no gradual improvement or with any further concerns.  Patient expresses understanding.  Final Clinical Impressions(s) / UC Diagnoses   Final diagnoses:  Acute pain of right knee   Discharge Instructions   None    ED Prescriptions     Medication Sig Dispense Auth. Provider   predniSONE (DELTASONE) 20 MG tablet Take 2 tablets (40 mg total) by mouth daily with breakfast for 5 days. 10 tablet Tomi Bamberger, [name]      PDMP not reviewed this encounter.   Tomi Bamberger, [name] 07/21/23 [phone]

## 2023-07-21 ENCOUNTER — Encounter (HOSPITAL_COMMUNITY): Payer: Self-pay | Admitting: Physician Assistant

## 2023-07-25 ENCOUNTER — Other Ambulatory Visit: Payer: Self-pay | Admitting: Obstetrics and Gynecology

## 2023-07-25 DIAGNOSIS — R928 Other abnormal and inconclusive findings on diagnostic imaging of breast: Secondary | ICD-10-CM

## 2023-08-19 ENCOUNTER — Ambulatory Visit
Admission: RE | Admit: 2023-08-19 | Discharge: 2023-08-19 | Disposition: A | Discharge status: Home / Self Care | Payer: 59 | Source: Ambulatory Visit | Attending: Obstetrics and Gynecology | Admitting: Obstetrics and Gynecology

## 2023-08-19 ENCOUNTER — Ambulatory Visit: Payer: 59

## 2023-08-19 DIAGNOSIS — R928 Other abnormal and inconclusive findings on diagnostic imaging of breast: Secondary | ICD-10-CM

## 2023-09-20 ENCOUNTER — Encounter (HOSPITAL_COMMUNITY): Payer: Self-pay

## 2023-09-20 ENCOUNTER — Ambulatory Visit (HOSPITAL_COMMUNITY)
Admission: EM | Admit: 2023-09-20 | Discharge: 2023-09-20 | Disposition: A | Discharge status: Home / Self Care | Payer: 59 | Attending: Emergency Medicine | Admitting: Emergency Medicine

## 2023-09-20 DIAGNOSIS — M7551 Bursitis of right shoulder: Secondary | ICD-10-CM | POA: Diagnosis not present

## 2023-09-20 DIAGNOSIS — M25811 Other specified joint disorders, right shoulder: Secondary | ICD-10-CM | POA: Diagnosis not present

## 2023-09-20 DIAGNOSIS — M7521 Bicipital tendinitis, right shoulder: Secondary | ICD-10-CM | POA: Diagnosis not present

## 2023-09-20 MED ORDER — METHYLPREDNISOLONE 4 MG PO TBPK: ORAL_TABLET | Freq: Every day | ORAL | 0 refills | Status: DC

## 2023-09-20 NOTE — ED Triage Notes (Signed)
Here for left shoulder pain that started a couple days ago. Denies any recent injuries or falls.

## 2023-09-20 NOTE — ED Provider Notes (Signed)
HPI  SUBJECTIVE:  Joanne Garrett is a right-handed 47 y.o. female who presents with 2 days of nonradiating right anterior shoulder pain described as soreness, that becomes sharp with movement.  It is constant.  No change in her physical activity, repetitive overhead movements, fevers, erythema, swelling.  No distal numbness, tingling or weakness.  She reports limited motion secondary to the pain.  No trauma to the shoulder or neck.  She has tried Tylenol, ibuprofen, heat and cold without improvement in her symptoms.  Symptoms of with abduction.  She has had symptoms like this before and was found to have a bursitis.  Which was treated with a cortisone injection and oral steroids at [facility]. Patient has a past medical history of left breast cancer status postlumpectomy, radiation, antiestrogen therapy.  Also history of history of hypercholesterolemia, hypertension, rheumatoid arthritis on methotrexate and Simponi.  Rheumatoid arthritis is located primarily her knees, hips and hands.  No history of chronic kidney disease.  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 and completed radiation 09-30-2012 and completed anti-estrogen therapy   Depression    Genital herpes    Hematuria    History of hyperthyroidism 02/2001   s/p  RAI 03-2001   History of panic attacks    History of radiation therapy    completed 09-23-2012  50Gy in 25 fractions and boost completed [phone]   History of thyroid nodule    Hypertension    Renal calculus, right    Rheumatoid arthritis (HCC)    Trigeminal neuralgia of right side of face 04/15/2019   V2   Urgency of urination     Past Surgical History:  Procedure Laterality Date   ABDOMINAL HYSTERECTOMY     ANTERIOR CERVICAL DECOMP/DISCECTOMY FUSION  05-18-2015   [facility]   BREAST LUMPECTOMY Left 07-03-2012     [facility]   CYSTOSCOPY WITH RETROGRADE PYELOGRAM, URETEROSCOPY AND STENT PLACEMENT Right 09/09/2017   Procedure: CYSTOSCOPY WITH RIGHT RETROGRADE PYELOGRAM, URETEROSCOPY , STONE BASKETRY AND STENT PLACEMENT;  Surgeon: Crista Elliot, MD;  Location: [facility];  Service: Urology;  Laterality: Right;   IR TRANSCATHETER BX  02/02/2020   IR US GUIDE VASC ACCESS RIGHT  02/02/2020   IR VENOGRAM HEPATIC W HEMODYNAMIC EVALUATION  02/02/2020   KNEE ARTHROSCOPY Left 2009   TOTAL ABDOMINAL HYSTERECTOMY W/ BILATERAL SALPINGOOPHORECTOMY  2014    Family History  Problem Relation Age of Onset   Hypertension Mother    COPD Mother    Depression Mother    Hypertension Father    Kidney disease Father    Alcohol abuse Father    Hypertension Brother    Alzheimer's disease Paternal Grandmother     Social History   Tobacco Use   Smoking status: Never   Smokeless tobacco: Never  Vaping Use   Vaping status: Never Used  Substance Use Topics   Alcohol use: No   Drug use: No    No current facility-administered medications for this encounter.  Current Outpatient Medications:    albuterol (VENTOLIN HFA) 108 (90 Base) MCG/ACT inhaler, 2 puffs every 6 (six) hours as needed for wheezing or shortness of breath., Disp: , Rfl:    amLODipine (NORVASC) 10 MG tablet, Take 1 tablet (10 mg total) by mouth daily., Disp: 30 tablet, Rfl: 0   clindamycin (CLEOCIN  T) 1 % lotion, 1 application. daily., Disp: , Rfl:    folic acid (FOLVITE) 1 MG tablet, Take 1 mg by mouth daily., Disp: , Rfl:    gabapentin (NEURONTIN) 300 MG capsule, Take 1 capsule (300 mg total) by mouth 2 (two) times daily., Disp: 60 capsule, Rfl: 5   LORazepam (ATIVAN) 0.5 MG tablet, 0.25-0.5 mg daily as needed for anxiety, Disp: 30 tablet, Rfl: 2   methylPREDNISolone (MEDROL DOSEPAK) 4 MG TBPK tablet, Take by mouth daily. Follow package instructions, Disp: 21 tablet, Rfl: 0   Multiple Vitamin (MULTIVITAMIN) tablet, Take 1 tablet by mouth  daily., Disp: , Rfl:    potassium chloride SA (K-DUR) 20 MEQ tablet, TAKE 2 TABLETS BY MOUTH FOR 7 DAYS THEN 1 TABLET DAILY (Patient taking differently: Take 20 mEq by mouth 2 (two) times daily.), Disp: 30 tablet, Rfl: 0   valACYclovir (VALTREX) 1000 MG tablet, Take 1 tablet (1,000 mg total) by mouth 2 (two) times daily as needed. X 5 days during an outbreak (Patient taking differently: Take 1,000 mg by mouth 2 (two) times daily as needed (for 5 days during an outbreak).), Disp: 30 tablet, Rfl: 2   venlafaxine XR (EFFEXOR-XR) 150 MG 24 hr capsule, Take total of 225 mg daily (150 mg + 75 mg daily) (Patient taking differently: 150 mg daily with breakfast. Take total of 225 mg daily (150 mg + 75 mg daily)), Disp: 90 capsule, Rfl: 1   baclofen (LIORESAL) 10 MG tablet, Take 1/2 to 1 pill up to 3 times a day as needed for facial pain, Disp: 30 each, Rfl: 6   Oxcarbazepine (TRILEPTAL) 300 MG tablet, Take 1 pill in the morning, 1 pill in the afternoon, and 2 pills at night, Disp: 120 tablet, Rfl: 6   venlafaxine XR (EFFEXOR-XR) 75 MG 24 hr capsule, 225 mg daily (150 mg + 75 mg daily) (Patient taking differently: 75 mg daily with breakfast. 225 mg daily (150 mg + 75 mg daily)), Disp: 90 capsule, Rfl: 1  Allergies  Allergen Reactions   Shellfish Allergy Itching and Other (See Comments)    HIVES IF GOES OVERBOARD, HAS HAD IV DYE AND HAD NO PROBLEMS   Shrimp Extract Itching    HIVES IF GOES OVERBOARD, HAS HAD IV DYE AND HAD NO PROBLEMS   Bactrim [Sulfamethoxazole-Trimethoprim] Hives and Other (See Comments)   Chocolate Flavoring Agent (Non-Screening) Itching and Other (See Comments)    PLAIN CHOCOLATE IN ABUNDANCE     ROS  As noted in HPI.   Physical Exam  BP 121/89 (BP Location: Left Arm)   Pulse 88   Temp 98.9 F (37.2 C) (Oral)   Resp 16   LMP 06/11/2013   SpO2 95%   Constitutional: Well developed, well nourished, no acute distress Eyes:  EOMI, conjunctiva normal bilaterally HENT:  Normocephalic, atraumatic,mucus membranes moist Respiratory: Normal inspiratory effort Cardiovascular: Normal rate GI: nondistended skin: No rash, skin intact Musculoskeletal: R shoulder: with ROM somewhat limited, Drop test painful but negative ,  clavicle NT, A/C joint tender, scapula NT, proximal humerus NT , trapezius  tender, Motor strength decreased at shoulder due to pain, Sensation intact LT over deltoid region, distal NVI with hand having intact sensation and strength in the median, radial, and ulnar nerve distribution.   pain with internal rotation, no pain with external rotation, Positive tenderness in bicipital groove, positive empty can test,  positive  liftoff test, RP 2+  Neurologic: Alert & oriented x 3, no focal neuro deficits Psychiatric: Speech  and behavior appropriate   ED Course   Medications - No data to display  Orders Placed This Encounter  Procedures   Recheck vitals    HR O2 sat, BP    Standing Status:   Standing    Number of Occurrences:   1    No results found for this or any previous visit (from the past 24 hour(s)). No results found.  ED Clinical Impression  1. Shoulder impingement, right   2. Biceps tendinitis of right upper extremity   3. Bursitis of right shoulder      ED Assessment/Plan     Presentation consistent with right shoulder impingement syndrome, likely from a bursitis.  May have a component of bicipital tendinitis for this.  Deferring imaging in the absence of trauma.  Home with Medrol Dosepak.  Tylenol 1000 mg 3 times a day.  Declined Norco.  Sparing use of topical diclofenac.  Follow-up with [facility] ASAP.  Discussed  MDM, treatment plan, and plan for follow-up with patient. patient agrees with plan.   Meds ordered this encounter  Medications   methylPREDNISolone (MEDROL DOSEPAK) 4 MG TBPK tablet    Sig: Take by mouth daily. Follow package instructions    Dispense:  21 tablet    Refill:  0      *This clinic note was  created using Dragon dictation software. Therefore, there may be occasional mistakes despite careful proofreading.  ?    Domenick Gong, MD 09/20/23 2023

## 2023-09-20 NOTE — Discharge Instructions (Signed)
Continue ice or heat, whichever feels better.  You may use Voltaren topical gel, but I would use it sparingly.  1000 mg of Tylenol 3 times a day.  No ibuprofen with methotrexate.  Follow-up with [facility] ASAP.

## 2023-10-23 ENCOUNTER — Ambulatory Visit (HOSPITAL_COMMUNITY)
Admission: EM | Admit: 2023-10-23 | Discharge: 2023-10-23 | Disposition: A | Discharge status: Home / Self Care | Payer: 59 | Attending: Sports Medicine | Admitting: Sports Medicine

## 2023-10-23 ENCOUNTER — Ambulatory Visit (INDEPENDENT_AMBULATORY_CARE_PROVIDER_SITE_OTHER): Payer: 59

## 2023-10-23 ENCOUNTER — Encounter (HOSPITAL_COMMUNITY): Payer: Self-pay | Admitting: *Deleted

## 2023-10-23 DIAGNOSIS — M25561 Pain in right knee: Secondary | ICD-10-CM | POA: Diagnosis not present

## 2023-10-23 LAB — SYNOVIAL CELL COUNT + DIFF, W/ CRYSTALS
Crystals, Fluid: NONE SEEN
Eosinophils-Synovial: 0 % (ref 0–1)
Lymphocytes-Synovial Fld: 1 % (ref 0–20)
Monocyte-Macrophage-Synovial Fluid: 22 % — ABNORMAL LOW (ref 50–90)
Neutrophil, Synovial: 77 % — ABNORMAL HIGH (ref 0–25)
WBC, Synovial: 36225 /mm3 — ABNORMAL HIGH (ref 0–200)

## 2023-10-23 MED ORDER — LIDOCAINE HCL (PF) 1 % IJ SOLN
INTRAMUSCULAR | Status: AC
  Filled 2023-10-23: qty 4

## 2023-10-23 MED ORDER — LIDOCAINE HCL (PF) 1 % IJ SOLN
3.0000 mL | Freq: Once | INTRAMUSCULAR | Status: AC
  Administered 2023-10-23: 3 mL

## 2023-10-23 NOTE — Discharge Instructions (Addendum)
You were seen today for acute on chronic right knee pain and swelling.  You had updated x-rays which showed: mild arthritis of the right knee, though no other bony abnormalities noted. We did an aspiration of the fluid for relief of some pain and to test for infected joint or gout/pseudogout of the knee. This will take a few days to result, but I will review this with you once I have the results.  I have also sent you a prescription for Meloxicam 15mg  (2 tablets of the 7.5mg ) to take every 24 hours. You may take tylenol with this up to 1000mg  every 8 hours for pain. I also recommend you ice the knee a few times daily for 10-15 minutes over the next 2-3 days.  I think you need an MRI of the knee to evaluate the structures within the joint as you may have a meniscus tear that needs to be address.  Let's follow-up in the sports medicine center in 2-3 weeks. Please call the below number to schedule this visit. [facility] Address: [address] [place], [place], [place] [address] Phone: [phone]

## 2023-10-23 NOTE — ED Provider Notes (Signed)
[facility]    CSN: [id number] Arrival date & time: 10/23/23  1030      History   Chief Complaint Chief Complaint  Patient presents with   Knee Pain    HPI Joanne Garrett is a 47 y.o. female.   Joanne Garrett is a 47yo female here with acute on chronic right knee pain and swelling. She works in a [employer] and had acute onset of pain yesterday in the right knee. She took ibuprofen with some relief, but awoke this morning and was unable to bear weight on the right leg without assistance due to the pain. She has been ambulating with a cane today. She is getting less relief from Ibuprofen and Tylenol today. She has a history of bilateral knee OA for which she has previously received cortisone injections, though hasn't had one in over a year. No recent trauma or injury of the knee. Occasional mechanical symptoms in the right knee. The swelling she has today has been unlike any previous arthritis flare for her. She denies a history of gout/pseudogout. No fevers/chills.   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 and completed radiation 09-30-2012 and completed anti-estrogen therapy   Depression    Genital herpes    Hematuria    History of hyperthyroidism 02/2001   s/p  RAI 03-2001   History of panic attacks    History of radiation therapy    completed 09-23-2012  50Gy in 25 fractions and boost completed [phone]   History of thyroid nodule    Hypertension    Renal calculus, right    Rheumatoid arthritis (HCC)    Trigeminal neuralgia of right side of face 04/15/2019   V2   Urgency of urination     Patient Active Problem List   Diagnosis Date Noted   Weakness 05/31/2020   Snoring 05/20/2019   Insomnia 05/20/2019   Trigeminal neuralgia of right side of face 04/15/2019   MDD (major depressive disorder),  recurrent, 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   Obesity (BMI 30.0-34.9) 01/31/2018   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   Rheumatoid arthritis (HCC)    PTSD (post-traumatic stress disorder) 09/17/2017   MDD (major depressive disorder), recurrent episode, moderate (HCC) 09/17/2017   HTN (hypertension) 01/16/2017   Genital herpes 01/16/2017   History of thyroid disorder 01/16/2017   Left thyroid nodule 01/16/2017   Breast pain, left 01/16/2017   History of breast cancer 01/16/2017    Past Surgical History:  Procedure Laterality Date   ABDOMINAL HYSTERECTOMY     ANTERIOR CERVICAL DECOMP/DISCECTOMY FUSION  05-18-2015   [facility]   BREAST LUMPECTOMY Left 07-03-2012    [facility]   CYSTOSCOPY WITH RETROGRADE PYELOGRAM, URETEROSCOPY AND STENT PLACEMENT Right 09/09/2017   Procedure: CYSTOSCOPY WITH RIGHT RETROGRADE PYELOGRAM, URETEROSCOPY , STONE BASKETRY AND STENT PLACEMENT;  Surgeon: Crista Elliot, MD;  Location: [facility] [place];  Service: Urology;  Laterality: Right;   IR TRANSCATHETER BX  02/02/2020   IR US GUIDE VASC ACCESS RIGHT  02/02/2020   IR VENOGRAM HEPATIC W HEMODYNAMIC EVALUATION  02/02/2020   KNEE ARTHROSCOPY Left 2009   TOTAL ABDOMINAL HYSTERECTOMY W/ BILATERAL SALPINGOOPHORECTOMY  2014    OB History   No obstetric history on file.      Home Medications    Prior to Admission  medications   Medication Sig Start Date End Date Taking? Authorizing Provider  amLODipine (NORVASC) 10 MG tablet Take 1 tablet (10 mg total) by mouth daily. 12/26/20  Yes Arby Barrette, MD  folic acid (FOLVITE) 1 MG tablet Take 1 mg by mouth daily. 10/20/18  Yes [provider]  gabapentin (NEURONTIN) 300 MG capsule Take 1 capsule (300 mg total) by mouth 2 (two) times daily. 04/29/23  Yes Ocie Doyne, MD  LORazepam (ATIVAN) 0.5 MG tablet 0.25-0.5 mg daily as needed for anxiety 10/17/20  Yes Hisada, Barbee Cough, MD  Multiple Vitamin (MULTIVITAMIN) tablet Take 1 tablet by  mouth daily.   Yes [provider]  venlafaxine XR (EFFEXOR-XR) 150 MG 24 hr capsule Take total of 225 mg daily (150 mg + 75 mg daily) Patient taking differently: 150 mg daily with breakfast. Take total of 225 mg daily (150 mg + 75 mg daily) 08/18/20  Yes Hisada, Barbee Cough, MD  venlafaxine XR (EFFEXOR-XR) 75 MG 24 hr capsule 225 mg daily (150 mg + 75 mg daily) Patient taking differently: 75 mg daily with breakfast. 225 mg daily (150 mg + 75 mg daily) 08/18/20  Yes Hisada, Reina, MD  albuterol (VENTOLIN HFA) 108 (90 Base) MCG/ACT inhaler 2 puffs every 6 (six) hours as needed for wheezing or shortness of breath. 10/13/21   [provider]  baclofen (LIORESAL) 10 MG tablet Take 1/2 to 1 pill up to 3 times a day as needed for facial pain 10/25/22   Ocie Doyne, MD  clindamycin (CLEOCIN T) 1 % lotion 1 application. daily. 03/05/22   [provider]  methylPREDNISolone (MEDROL DOSEPAK) 4 MG TBPK tablet Take by mouth daily. Follow package instructions 09/20/23   Domenick Gong, MD  Oxcarbazepine (TRILEPTAL) 300 MG tablet Take 1 pill in the morning, 1 pill in the afternoon, and 2 pills at night 10/25/22   Ocie Doyne, MD  potassium chloride SA (K-DUR) 20 MEQ tablet TAKE 2 TABLETS BY MOUTH FOR 7 DAYS THEN 1 TABLET DAILY Patient taking differently: Take 20 mEq by mouth 2 (two) times daily. 03/09/19   Henson, Vickie L, NP-C  valACYclovir (VALTREX) 1000 MG tablet Take 1 tablet (1,000 mg total) by mouth 2 (two) times daily as needed. X 5 days during an outbreak Patient taking differently: Take 1,000 mg by mouth 2 (two) times daily as needed (for 5 days during an outbreak). 08/15/18   Avanell Shackleton, NP-C    Family History Family History  Problem Relation Age of Onset   Hypertension Mother    COPD Mother    Depression Mother    Hypertension Father    Kidney disease Father    Alcohol abuse Father    Hypertension Brother    Alzheimer's disease Paternal Grandmother      Social History Social History   Tobacco Use   Smoking status: Never   Smokeless tobacco: Never  Vaping Use   Vaping status: Never Used  Substance Use Topics   Alcohol use: No   Drug use: No     Allergies   Shellfish allergy, Shrimp extract, Bactrim [sulfamethoxazole-trimethoprim], and Chocolate flavoring agent (non-screening)   Review of Systems Review of Systems  Musculoskeletal:  Positive for arthralgias, gait problem and joint swelling.  Skin:  Negative for rash.     Physical Exam Triage Vital Signs ED Triage Vitals  Encounter Vitals Group     BP 10/23/23 1117 126/83     Systolic BP Percentile --      Diastolic BP Percentile --  Pulse Rate 10/23/23 1117 90     Resp 10/23/23 1117 16     Temp 10/23/23 1117 98.7 F (37.1 C)     Temp Source 10/23/23 1117 Oral     SpO2 10/23/23 1117 98 %     Weight --      Height --      Head Circumference --      Peak Flow --      Pain Score 10/23/23 1118 8     Pain Loc --      Pain Education --      Exclude from Growth Chart --    No data found.  Updated Vital Signs BP 126/83   Pulse 90   Temp 98.7 F (37.1 C) (Oral)   Resp 16   LMP 06/11/2013   SpO2 98%   Visual Acuity Right Eye Distance:   Left Eye Distance:   Bilateral Distance:    Right Eye Near:   Left Eye Near:    Bilateral Near:     Physical Exam Constitutional:      General: She is not in acute distress.    Appearance: Normal appearance. She is normal weight. She is not ill-appearing.  Musculoskeletal:     Comments: Right Knee: Large effusion present and warm/erythematous knee. No bony deformity, ecchymoses, or wounds. Diffusely TTP. Joint lines significantly TTP. ROM limited by effusion: flex to about 80d, extends to 5d shy of full extension. Negative ant/post drawers. Negative valgus/varus testing. Negative lachman. Positive Thessaly, unable to tolerate McMurray. NV intact distally.   Neurological:     Mental Status: She is alert.       UC Treatments / Results  Labs (all labs ordered are listed, but only abnormal results are displayed) Labs Reviewed  BODY FLUID CULTURE W GRAM STAIN  SYNOVIAL CELL COUNT + DIFF, W/ CRYSTALS    EKG   Radiology No results found.  Procedures Procedures (including critical care time)  Right Knee Ultrasound Guided Aspiration: After informed verbal consent, a timeout was performed to verify correct patient, procedure, site. Patient was in seated position on exam table in supine position.  Right knee was prepped with alcohol swab x2. A linear array probe was utilized to visualized the suprapatellar recess. Large effusion noted on ultrasound in this space. Ethyl chloride spray used for topical anesthetic and in an in-plane superolateral approach an injection of 3mL of 1% lidocaine was injected as a local anesthesia track using a 25g 1.5in needle. Utilizing the same superolateral in-plane approach and a 18g 1.5in needle, the right suprapatellar recess then was apirated. 34cc of cloudy, straw-colored fluid aspirated. Following the injection a bandage was applied to the area. Patient tolerated procedure well without immediate complications. The patient was counseled as to the expected post-injection course. Instructed as to concerning symptoms and advised to contact the office if these should arise.   Medications Ordered in UC Medications  lidocaine (PF) (XYLOCAINE) 1 % injection 3 mL (3 mLs Other Given by Other 10/23/23 1237)    Initial Impression / Assessment and Plan / UC Course  I have reviewed the triage vital signs and the nursing notes.  Pertinent labs & imaging results that were available during my care of the patient were reviewed by me and considered in my medical decision making (see chart for details).    Vitals and triage reviewed, patient is hemodynamically stable.  She has acute on chronic right knee pain with large effusion with known mild tricompartment right knee  arthritis. Given the large effusion and warm joint without inciting trauma I do have little concern for a septic knee joint vs gout/pseudogout. Degree of her swelling is more than I would expect with just a flare of her underlying OA. Right knee standing XR obtained and shows mild degenerative changes and large effusion c/w with arthritis though no acute osseous abnormality on my impression - formal radiology read pending. U/S guided knee aspiration performed as described above. I will send the aspirate for culture, gram-stain, and fluid/crystal analysis. Recommended she keep the knee compressed, rest, and ice over the next few days. Prescription also sent for Meloxicam. I will follow-up the aspiration results with the patient once resulted, low initial suspicion for septic joint based on the aspirate appearance. Deferred injecting steroid given some slight concern for septic joint. I do think given the size of this effusion and her mechanical symptoms the patient does warrant an MRI of the knee to evaluate for meniscal disease. She is ligamentously intact on exam. Advised f/u with sports medicine in 2-3 weeks. Her questions were answered and she is in agreement with this plan. Work note provided for light duty over the next 3 weeks.   Final Clinical Impressions(s) / UC Diagnoses   Final diagnoses:  Acute pain of right knee     Discharge Instructions      You were seen today for acute on chronic right knee pain and swelling.  You had updated x-rays which showed: mild arthritis of the right knee, though no other bony abnormalities noted. We did an aspiration of the fluid for relief of some pain and to test for infected joint or gout/pseudogout of the knee. This will take a few days to result, but I will review this with you once I have the results.  I have also sent you a prescription for Meloxicam 15mg  (2 tablets of the 7.5mg ) to take every 24 hours. You may take tylenol with this up to 1000mg  every  8 hours for pain. I also recommend you ice the knee a few times daily for 10-15 minutes over the next 2-3 days.  I think you need an MRI of the knee to evaluate the structures within the joint as you may have a meniscus tear that needs to be address.  Let's follow-up in the sports medicine center in 2-3 weeks. Please call the below number to schedule this visit. [facility] Address: [address] [place], [place], [place] [address] Phone: [phone]   ED Prescriptions   None    PDMP not reviewed this encounter.   Marisa Cyphers, MD 10/23/23 [phone]

## 2023-10-23 NOTE — ED Triage Notes (Signed)
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 Reports pain to right posterior and lateral knee. Denies parasthesias. Ambulating with cane due to pain and limp. Has tried Tyl, IBU, muscle rub, ice.

## 2023-10-27 LAB — BODY FLUID CULTURE W GRAM STAIN: Culture: NO GROWTH

## 2023-10-28 ENCOUNTER — Encounter: Payer: Self-pay | Admitting: Sports Medicine

## 2023-11-12 ENCOUNTER — Encounter (HOSPITAL_COMMUNITY): Payer: Self-pay

## 2023-11-12 ENCOUNTER — Ambulatory Visit (HOSPITAL_COMMUNITY)
Admission: EM | Admit: 2023-11-12 | Discharge: 2023-11-12 | Disposition: A | Discharge status: Home / Self Care | Payer: 59

## 2023-11-12 DIAGNOSIS — M25512 Pain in left shoulder: Secondary | ICD-10-CM | POA: Diagnosis not present

## 2023-11-12 MED ORDER — CYCLOBENZAPRINE HCL 10 MG PO TABS: 10.0000 mg | ORAL_TABLET | Freq: Two times a day (BID) | ORAL | 0 refills | Status: DC | PRN

## 2023-11-12 MED ORDER — PREDNISONE 20 MG PO TABS: 40.0000 mg | ORAL_TABLET | Freq: Every day | ORAL | 0 refills | Status: AC

## 2023-11-12 NOTE — ED Triage Notes (Signed)
 Patient here today with c/o left shoulder pain since yesterday upon waking. No known injury. She has tried IBU and icing with no relief.

## 2023-11-12 NOTE — Discharge Instructions (Addendum)
 You can take the muscle relaxer (Flexeril) twice daily. If the medication makes you drowsy, take only at bed time.  Prednisone 40 mg daily x 5 days  Please follow up with orthopedics as soon as able!

## 2023-11-12 NOTE — ED Provider Notes (Signed)
 [facility]    CSN: [id number] Arrival date & time: 11/12/23  1514     History   Chief Complaint Chief Complaint  Patient presents with   Shoulder Pain    HPI Joanne Garrett is a 48 y.o. female.  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 and completed radiation 09-30-2012 and completed anti-estrogen therapy   Depression    Genital herpes    Hematuria    History of hyperthyroidism 02/2001   s/p  RAI 03-2001   History of panic attacks    History of radiation therapy    completed 09-23-2012  50Gy in 25 fractions and boost completed [phone]   History of thyroid  nodule    Hypertension    Renal calculus, right    Rheumatoid arthritis (HCC)    Trigeminal neuralgia of right side of face 04/15/2019   V2   Urgency of urination     Patient Active Problem List   Diagnosis Date Noted   Weakness 05/31/2020   Snoring 05/20/2019   Insomnia 05/20/2019   Trigeminal neuralgia of right side of face 04/15/2019   MDD (major depressive disorder), recurrent, 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   Obesity (BMI 30.0-34.9) 01/31/2018   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   Rheumatoid arthritis (HCC)    PTSD (post-traumatic stress disorder) 09/17/2017   MDD (major depressive disorder), recurrent episode, moderate (HCC) 09/17/2017   HTN (hypertension) 01/16/2017   Genital herpes 01/16/2017   History of thyroid  disorder 01/16/2017   Left thyroid  nodule 01/16/2017   Breast pain, left 01/16/2017   History of breast cancer  01/16/2017    Past Surgical History:  Procedure Laterality Date   ABDOMINAL HYSTERECTOMY     ANTERIOR CERVICAL DECOMP/DISCECTOMY FUSION  05-18-2015   [facility]   BREAST LUMPECTOMY Left 07-03-2012    [facility]   CYSTOSCOPY WITH RETROGRADE PYELOGRAM, URETEROSCOPY AND STENT PLACEMENT Right 09/09/2017   Procedure: CYSTOSCOPY WITH RIGHT RETROGRADE PYELOGRAM, URETEROSCOPY , STONE BASKETRY AND STENT PLACEMENT;  Surgeon: Carolee Sherwood JONETTA DOUGLAS, MD;  Location: [facility];  Service: Urology;  Laterality: Right;   IR TRANSCATHETER BX  02/02/2020   IR US  GUIDE VASC ACCESS RIGHT  02/02/2020   IR VENOGRAM HEPATIC W HEMODYNAMIC EVALUATION  02/02/2020   KNEE ARTHROSCOPY Left 2009   TOTAL ABDOMINAL HYSTERECTOMY W/ BILATERAL SALPINGOOPHORECTOMY  2014    OB History   No obstetric history on file.      Home Medications    Prior to Admission medications   Medication Sig Start Date End Date Taking? Authorizing Provider  cyclobenzaprine  (FLEXERIL ) 10 MG tablet Take 1 tablet (10 mg total) by mouth 2 (two) times daily as needed for muscle spasms. 11/12/23  Yes Jovane Foutz, Asberry, [name]  ibuprofen  (ADVIL ) 200 MG tablet Take 200 mg by mouth every 8 (eight) hours as needed. 08/31/22  Yes [provider]  methotrexate (RHEUMATREX) 2.5 MG tablet Take 2.5 mg by mouth once a week. 12/21/22  Yes [provider]  predniSONE  (DELTASONE ) 20 MG tablet Take  2 tablets (40 mg total) by mouth daily with breakfast for 5 days. 11/12/23 11/17/23 Yes Shakeia Krus, Asberry, [name]  albuterol  (VENTOLIN  HFA) 108 (90 Base) MCG/ACT inhaler 2 puffs every 6 (six) hours as needed for wheezing or shortness of breath. 10/13/21   [provider]  amLODipine  (NORVASC ) 10 MG tablet Take 1 tablet (10 mg total) by mouth daily. 12/26/20   Armenta Canning, MD  clindamycin (CLEOCIN T) 1 % lotion 1 application. daily. 03/05/22   [provider]  folic acid (FOLVITE) 1 MG tablet Take 1 mg by mouth daily. 10/20/18   [provider]  gabapentin  (NEURONTIN ) 300 MG capsule Take 1 capsule (300 mg total) by mouth 2 (two) times daily. 04/29/23   Rush Nest, MD  Multiple Vitamin (MULTIVITAMIN) tablet Take 1 tablet by mouth daily.    [provider]  olmesartan  (BENICAR ) 20 MG tablet Take 20 mg by mouth daily.    [provider]  Oxcarbazepine  (TRILEPTAL ) 300 MG tablet Take 1 pill in the morning, 1 pill in the afternoon, and 2 pills at night 10/25/22   Rush Nest, MD  valACYclovir  (VALTREX ) 1000 MG tablet Take 1 tablet (1,000 mg total) by mouth 2 (two) times daily as needed. X 5 days during an outbreak Patient taking differently: Take 1,000 mg by mouth 2 (two) times daily as needed (for 5 days during an outbreak). 08/15/18   Henson, Vickie L, NP-C  venlafaxine  XR (EFFEXOR -XR) 150 MG 24 hr capsule Take total of 225 mg daily (150 mg + 75 mg daily) Patient taking differently: 150 mg daily with breakfast. Take total of 225 mg daily (150 mg + 75 mg daily) 08/18/20   Hisada, Katheren, MD  venlafaxine  XR (EFFEXOR -XR) 75 MG 24 hr capsule 225 mg daily (150 mg + 75 mg daily) Patient taking differently: 75 mg daily with breakfast. 225 mg daily (150 mg + 75 mg daily) 08/18/20   Vickey Katheren, MD    Family History Family History  Problem Relation Age of Onset   Hypertension Mother    COPD Mother    Depression Mother    Hypertension Father    Kidney disease Father    Alcohol abuse Father    Hypertension Brother    Alzheimer's disease Paternal Grandmother     Social History Social History   Tobacco Use   Smoking status: Never   Smokeless tobacco: Never  Vaping Use   Vaping status: Never Used  Substance Use Topics   Alcohol use: No   Drug use: No     Allergies   Shellfish allergy, Shrimp extract, Bactrim [sulfamethoxazole-trimethoprim], and Chocolate flavoring agent (non-screening)   Review of Systems Review of Systems Per HPI  Physical Exam Triage Vital Signs ED Triage Vitals   Encounter Vitals Group     BP 11/12/23 1611 102/67     Systolic BP Percentile --      Diastolic BP Percentile --      Pulse Rate 11/12/23 1611 (!) 102     Resp 11/12/23 1611 16     Temp 11/12/23 1611 98.7 F (37.1 C)     Temp Source 11/12/23 1611 Oral     SpO2 11/12/23 1611 100 %     Weight 11/12/23 1611 203 lb (92.1 kg)     Height 11/12/23 1611 5' 3.25 (1.607 m)     Head Circumference --      Peak Flow --      Pain Score 11/12/23 1612 6  Pain Loc --      Pain Education --      Exclude from Growth Chart --    No data found.  Updated Vital Signs BP 102/67 (BP Location: Right Arm)   Pulse (!) 102   Temp 98.7 F (37.1 C) (Oral)   Resp 16   Ht 5' 3.25 (1.607 m)   Wt 203 lb (92.1 kg)   LMP 06/11/2013   SpO2 100%   BMI 35.68 kg/m    Physical Exam Vitals and nursing note reviewed.  Constitutional:      General: She is not in acute distress. HENT:     Mouth/Throat:     Pharynx: Oropharynx is clear.  Cardiovascular:     Rate and Rhythm: Normal rate and regular rhythm.     Pulses: Normal pulses.  Pulmonary:     Effort: Pulmonary effort is normal.  Musculoskeletal:     Left shoulder: No swelling, effusion, tenderness or bony tenderness. Decreased range of motion. Normal strength. Normal pulse.     Cervical back: Normal range of motion.     Comments: Decreased ROM in all fields. There is no bony or muscular tenderness to palpation. Strength 5/5, sensation intact distally, strong radial pulse  Skin:    Capillary Refill: Capillary refill takes less than 2 seconds.  Neurological:     Mental Status: She is alert and oriented to person, place, and time.     UC Treatments / Results  Labs (all labs ordered are listed, but only abnormal results are displayed) Labs Reviewed - No data to display  EKG  Radiology No results found.  Procedures Procedures   Medications Ordered in UC Medications - No data to display  Initial Impression / Assessment and Plan / UC  Course  I have reviewed the triage vital signs and the nursing notes.  Pertinent labs & imaging results that were available during my care of the patient were reviewed by me and considered in my medical decision making (see chart for details).  Discussed with patient possible etiologies; bursitis vs tendonitis or similar to prior RA flare ups. She has done well with prednisone  in the past. Sent burst, 40 mg daily x 5. Can also try flexeril  BID prn. Shared decision making defer xray today; no bony tenderness or trauma. She will follow with her ortho specialist later in the week. Agrees to plan, no questions   Final Clinical Impressions(s) / UC Diagnoses   Final diagnoses:  Acute pain of left shoulder     Discharge Instructions      You can take the muscle relaxer (Flexeril ) twice daily. If the medication makes you drowsy, take only at bed time.  Prednisone  40 mg daily x 5 days  Please follow up with orthopedics as soon as able!      ED Prescriptions     Medication Sig Dispense Auth. Provider   cyclobenzaprine  (FLEXERIL ) 10 MG tablet Take 1 tablet (10 mg total) by mouth 2 (two) times daily as needed for muscle spasms. 20 tablet Frieda Arnall, [name]   predniSONE  (DELTASONE ) 20 MG tablet Take 2 tablets (40 mg total) by mouth daily with breakfast for 5 days. 10 tablet Yeray Tomas, Asberry, [name]      PDMP not reviewed this encounter.   Jeryl Asberry, [place] 11/12/23 8195

## 2023-12-09 ENCOUNTER — Ambulatory Visit: Payer: 59 | Admitting: Family Medicine

## 2023-12-10 ENCOUNTER — Ambulatory Visit: Payer: 59 | Admitting: Family Medicine

## 2023-12-12 DIAGNOSIS — G4733 Obstructive sleep apnea (adult) (pediatric): Secondary | ICD-10-CM | POA: Insufficient documentation

## 2023-12-16 ENCOUNTER — Ambulatory Visit (HOSPITAL_COMMUNITY)
Admission: EM | Admit: 2023-12-16 | Discharge: 2023-12-16 | Disposition: A | Discharge status: Home / Self Care | Payer: 59 | Attending: Emergency Medicine | Admitting: Emergency Medicine

## 2023-12-16 ENCOUNTER — Encounter (HOSPITAL_COMMUNITY): Payer: Self-pay

## 2023-12-16 ENCOUNTER — Ambulatory Visit (INDEPENDENT_AMBULATORY_CARE_PROVIDER_SITE_OTHER): Payer: 59

## 2023-12-16 DIAGNOSIS — R051 Acute cough: Secondary | ICD-10-CM

## 2023-12-16 DIAGNOSIS — J069 Acute upper respiratory infection, unspecified: Secondary | ICD-10-CM | POA: Diagnosis not present

## 2023-12-16 DIAGNOSIS — R0602 Shortness of breath: Secondary | ICD-10-CM

## 2023-12-16 MED ORDER — AZITHROMYCIN 250 MG PO TABS: ORAL_TABLET | ORAL | 0 refills | Status: DC

## 2023-12-16 MED ORDER — PROMETHAZINE-DM 6.25-15 MG/5ML PO SYRP: 5.0000 mL | ORAL_SOLUTION | Freq: Every evening | ORAL | 0 refills | Status: DC | PRN

## 2023-12-16 MED ORDER — ALBUTEROL SULFATE HFA 108 (90 BASE) MCG/ACT IN AERS: 1.0000 | INHALATION_SPRAY | Freq: Four times a day (QID) | RESPIRATORY_TRACT | 0 refills | Status: DC | PRN

## 2023-12-16 MED ORDER — BENZONATATE 100 MG PO CAPS: 100.0000 mg | ORAL_CAPSULE | Freq: Three times a day (TID) | ORAL | 0 refills | Status: DC

## 2023-12-16 NOTE — Discharge Instructions (Signed)
 You chest x-ray did not reveal an obvious pneumonia. Start taking azithromycin  by taking 2 tablets today and 1 tablet on the remaining 4 days. I have prescribed Tessalon  for cough to take every 8 hours as needed. I have also prescribed promethazine  DM cough syrup that you can take at night for cough. This can make you drowsy so do not take while driving.  Otherwise alternate between Tylenol  and ibuprofen  as needed for pain and fever.  I also recommend taking Mucinex  to help with cough and congestion.  Return here if symptoms persist or worsen.

## 2023-12-16 NOTE — ED Provider Notes (Signed)
 [facility]    CSN: [id number] Arrival date & time: 12/16/23  1029      History   Chief Complaint Chief Complaint  Patient presents with   Cough   Shortness of Breath    HPI Joanne Garrett is a 48 y.o. female.   Patient presents with productive cough with thick yellow phlegm, shortness of breath, congestion, and body aches since Thursday. Patient also endorses chest pain with deep breathing. Denies known fever, abdominal pain, nausea, vomiting, and diarrhea.    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 and completed radiation 09-30-2012 and completed anti-estrogen therapy   Depression    Genital herpes    Hematuria    History of hyperthyroidism 02/2001   s/p  RAI 03-2001   History of panic attacks    History of radiation therapy    completed 09-23-2012  50Gy in 25 fractions and boost completed [phone]   History of thyroid  nodule    Hypertension    Renal calculus, right    Rheumatoid arthritis (HCC)    Trigeminal neuralgia of right side of face 04/15/2019   V2   Urgency of urination     Patient Active Problem List   Diagnosis Date Noted   Weakness 05/31/2020   Snoring 05/20/2019   Insomnia 05/20/2019   Trigeminal neuralgia of right side of face 04/15/2019   MDD (major depressive disorder), recurrent, 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   Obesity (BMI 30.0-34.9) 01/31/2018   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   Rheumatoid arthritis (HCC)    PTSD (post-traumatic stress disorder) 09/17/2017   MDD (major depressive disorder),  recurrent episode, moderate (HCC) 09/17/2017   HTN (hypertension) 01/16/2017   Genital herpes 01/16/2017   History of thyroid  disorder 01/16/2017   Left thyroid  nodule 01/16/2017   Breast pain, left 01/16/2017   History of breast cancer 01/16/2017    Past Surgical History:  Procedure Laterality Date   ABDOMINAL HYSTERECTOMY     ANTERIOR CERVICAL DECOMP/DISCECTOMY FUSION  05-18-2015   [facility]   BREAST LUMPECTOMY Left 07-03-2012    [facility]   CYSTOSCOPY WITH RETROGRADE PYELOGRAM, URETEROSCOPY AND STENT PLACEMENT Right 09/09/2017   Procedure: CYSTOSCOPY WITH RIGHT RETROGRADE PYELOGRAM, URETEROSCOPY , STONE BASKETRY AND STENT PLACEMENT;  Surgeon: Samson Croak, MD;  Location: [facility];  Service: Urology;  Laterality: Right;   IR TRANSCATHETER BX  02/02/2020   IR US  GUIDE VASC ACCESS RIGHT  02/02/2020   IR VENOGRAM HEPATIC W HEMODYNAMIC EVALUATION  02/02/2020   KNEE ARTHROSCOPY Left 2009   TOTAL ABDOMINAL HYSTERECTOMY W/ BILATERAL SALPINGOOPHORECTOMY  2014    OB History   No obstetric history on file.      Home Medications    Prior to Admission medications   Medication Sig Start Date End Date Taking? Authorizing Provider  albuterol  (VENTOLIN  HFA) 108 (90 Base) MCG/ACT inhaler Inhale 1-2 puffs into the lungs every 6 (six) hours as needed for wheezing or shortness of breath. 12/16/23  Yes Levora Reas A, NP  amLODipine  (NORVASC ) 10 MG tablet  Take 1 tablet (10 mg total) by mouth daily. 12/26/20  Yes Wynetta Heckle, MD  azithromycin  (ZITHROMAX  Z-PAK) 250 MG tablet Take 2 pills (500mg ) first day and one pill (250mg ) the remaining 4 days. 12/16/23  Yes Rosevelt Constable, Woodie Degraffenreid A, NP  benzonatate  (TESSALON ) 100 MG capsule Take 1 capsule (100 mg total) by mouth every 8 (eight) hours. 12/16/23  Yes Rosevelt Constable, Lashe Oliveira A, NP  folic acid (FOLVITE) 1 MG tablet Take 1 mg by mouth daily. 10/20/18  Yes [provider]  methotrexate (RHEUMATREX) 2.5 MG tablet Take 2.5 mg by mouth once a  week. 12/21/22  Yes [provider]  olmesartan (BENICAR) 20 MG tablet Take 20 mg by mouth daily.   Yes [provider]  promethazine -dextromethorphan (PROMETHAZINE -DM) 6.25-15 MG/5ML syrup Take 5 mLs by mouth at bedtime as needed for cough. 12/16/23  Yes Rosevelt Constable, Shiya Fogelman A, NP  valACYclovir  (VALTREX ) 1000 MG tablet Take 1 tablet (1,000 mg total) by mouth 2 (two) times daily as needed. X 5 days during an outbreak Patient taking differently: Take 1,000 mg by mouth 2 (two) times daily as needed (for 5 days during an outbreak). 08/15/18  Yes Henson, Vickie L, NP-C  venlafaxine  XR (EFFEXOR -XR) 150 MG 24 hr capsule Take total of 225 mg daily (150 mg + 75 mg daily) Patient taking differently: 150 mg daily with breakfast. Take total of 225 mg daily (150 mg + 75 mg daily) 08/18/20  Yes Hisada, Ivan Marion, MD  venlafaxine  XR (EFFEXOR -XR) 75 MG 24 hr capsule 225 mg daily (150 mg + 75 mg daily) Patient taking differently: 75 mg daily with breakfast. 225 mg daily (150 mg + 75 mg daily) 08/18/20  Yes Hisada, Reina, MD  clindamycin (CLEOCIN T) 1 % lotion 1 application. daily. 03/05/22   [provider]  gabapentin  (NEURONTIN ) 300 MG capsule Take 1 capsule (300 mg total) by mouth 2 (two) times daily. 04/29/23   Dala Dublin, MD  ibuprofen  (ADVIL ) 200 MG tablet Take 200 mg by mouth every 8 (eight) hours as needed. 08/31/22   [provider]  Multiple Vitamin (MULTIVITAMIN) tablet Take 1 tablet by mouth daily.    [provider]    Family History Family History  Problem Relation Age of Onset   Hypertension Mother    COPD Mother    Depression Mother    Hypertension Father    Kidney disease Father    Alcohol abuse Father    Hypertension Brother    Alzheimer's disease Paternal Grandmother     Social History Social History   Tobacco Use   Smoking status: Never   Smokeless tobacco: Never  Vaping Use   Vaping status: Never Used  Substance Use Topics   Alcohol use:  Yes    Alcohol/week: 1.0 standard drink of alcohol    Types: 1 Glasses of wine per week    Comment: occ   Drug use: No     Allergies   Shellfish allergy, Shrimp extract, Bactrim [sulfamethoxazole-trimethoprim], and Chocolate flavoring agent (non-screening)   Review of Systems Review of Systems  Constitutional:  Positive for chills and fatigue. Negative for fever.  HENT:  Positive for congestion and rhinorrhea.   Respiratory:  Positive for cough and shortness of breath. Negative for chest tightness and wheezing.   Cardiovascular:  Positive for chest pain.  Gastrointestinal:  Negative for abdominal pain, diarrhea, nausea and vomiting.  Musculoskeletal:  Positive for arthralgias.     Physical Exam Triage Vital Signs ED Triage Vitals  Encounter Vitals Group  BP 12/16/23 1253 124/79     Systolic BP Percentile --      Diastolic BP Percentile --      Pulse Rate 12/16/23 1253 85     Resp 12/16/23 1253 16     Temp 12/16/23 1253 99 F (37.2 C)     Temp Source 12/16/23 1253 Oral     SpO2 12/16/23 1253 96 %     Weight --      Height --      Head Circumference --      Peak Flow --      Pain Score 12/16/23 1247 3     Pain Loc --      Pain Education --      Exclude from Growth Chart --    No data found.  Updated Vital Signs BP 124/79 (BP Location: Left Arm)   Pulse 85   Temp 99 F (37.2 C) (Oral)   Resp 16   LMP 06/11/2013   SpO2 96%   Visual Acuity Right Eye Distance:   Left Eye Distance:   Bilateral Distance:    Right Eye Near:   Left Eye Near:    Bilateral Near:     Physical Exam Vitals and nursing note reviewed.  Constitutional:      General: She is awake. She is not in acute distress.    Appearance: Normal appearance. She is well-developed and well-groomed. She is not ill-appearing.  HENT:     Right Ear: Tympanic membrane, ear canal and external ear normal.     Left Ear: Tympanic membrane, ear canal and external ear normal.     Nose: Congestion and  rhinorrhea present.     Mouth/Throat:     Mouth: Mucous membranes are moist.     Pharynx: Posterior oropharyngeal erythema present. No oropharyngeal exudate.  Cardiovascular:     Rate and Rhythm: Normal rate and regular rhythm.  Pulmonary:     Effort: Pulmonary effort is normal.     Breath sounds: Normal breath sounds.  Musculoskeletal:        General: Normal range of motion.     Cervical back: Normal range of motion and neck supple.  Skin:    General: Skin is warm and dry.  Neurological:     Mental Status: She is alert.  Psychiatric:        Behavior: Behavior is cooperative.      UC Treatments / Results  Labs (all labs ordered are listed, but only abnormal results are displayed) Labs Reviewed - No data to display  EKG   Radiology DG Chest 2 View Result Date: 12/16/2023 CLINICAL DATA:  Shortness of breath and cough EXAM: CHEST - 2 VIEW COMPARISON:  05/14/2021 FINDINGS: The heart size and mediastinal contours are within normal limits. Both lungs are clear. The visualized skeletal structures are unremarkable. Hardware in the cervical spine. IMPRESSION: No active cardiopulmonary disease. Electronically Signed   By: Esmeralda Hedge M.D.   On: 12/16/2023 15:14    Procedures Procedures (including critical care time)  Medications Ordered in UC Medications - No data to display  Initial Impression / Assessment and Plan / UC Course  I have reviewed the triage vital signs and the nursing notes.  Pertinent labs & imaging results that were available during my care of the patient were reviewed by me and considered in my medical decision making (see chart for details).     Patient presented with 5-day history of productive cough, shortness of breath, congestion, and  body aches. Endorses chest pain with deep breathing. Denies any other symptoms.   Upon assessment congestion and rhinorrhea are present, mild erythema noted to pharynx. Lungs clear bilaterally on auscultation. Chest x-ray  did not reveal any obvious pneumonia.   Prescribed Azithromycin  for persistent respiratory infection. Prescribed albuterol  inhaler as needed for shortness of breath. Prescribed Tessalon  and promethazine  DM as needed for cough. Discussed return precautions.  Final Clinical Impressions(s) / UC Diagnoses   Final diagnoses:  Acute cough  Acute upper respiratory infection  Shortness of breath     Discharge Instructions      You chest x-ray did not reveal an obvious pneumonia. Start taking azithromycin  by taking 2 tablets today and 1 tablet on the remaining 4 days. I have prescribed Tessalon  for cough to take every 8 hours as needed. I have also prescribed promethazine  DM cough syrup that you can take at night for cough. This can make you drowsy so do not take while driving.  Otherwise alternate between Tylenol  and ibuprofen  as needed for pain and fever.  I also recommend taking Mucinex  to help with cough and congestion.  Return here if symptoms persist or worsen.      ED Prescriptions     Medication Sig Dispense Auth. Provider   azithromycin  (ZITHROMAX  Z-PAK) 250 MG tablet Take 2 pills (500mg ) first day and one pill (250mg ) the remaining 4 days. 6 tablet Levora Reas A, NP   albuterol  (VENTOLIN  HFA) 108 (90 Base) MCG/ACT inhaler Inhale 1-2 puffs into the lungs every 6 (six) hours as needed for wheezing or shortness of breath. 8.5 g Levora Reas A, NP   benzonatate  (TESSALON ) 100 MG capsule Take 1 capsule (100 mg total) by mouth every 8 (eight) hours. 21 capsule Levora Reas A, NP   promethazine -dextromethorphan (PROMETHAZINE -DM) 6.25-15 MG/5ML syrup Take 5 mLs by mouth at bedtime as needed for cough. 118 mL Levora Reas A, NP      PDMP not reviewed this encounter.   Levora Reas A, NP 12/16/23 [id number]

## 2023-12-16 NOTE — ED Triage Notes (Addendum)
 Pt c/o of body aches, right sided ear pain, nasal congestion, cough, chest pain with coughing, shortness of breath since last Thursday.   Home Interventions: Tylenol , Sudafed

## 2023-12-18 ENCOUNTER — Ambulatory Visit (INDEPENDENT_AMBULATORY_CARE_PROVIDER_SITE_OTHER): Payer: 59 | Admitting: Sports Medicine

## 2023-12-18 ENCOUNTER — Other Ambulatory Visit: Payer: Self-pay

## 2023-12-18 VITALS — BP 120/88 | Ht 63.0 in | Wt 200.0 lb

## 2023-12-18 DIAGNOSIS — M25512 Pain in left shoulder: Secondary | ICD-10-CM | POA: Diagnosis not present

## 2023-12-18 DIAGNOSIS — M25561 Pain in right knee: Secondary | ICD-10-CM

## 2023-12-18 MED ORDER — METHYLPREDNISOLONE ACETATE 40 MG/ML IJ SUSP
40.0000 mg | Freq: Once | INTRAMUSCULAR | Status: AC
Start: 2023-12-18 — End: 2023-12-18
  Administered 2023-12-18: 40 mg via INTRA_ARTICULAR

## 2023-12-18 NOTE — Progress Notes (Signed)
PCP: Lindaann Pascal, [name]  SUBJECTIVE:   HPI:  Patient is a 48 y.o. female here with chief complaint of right knee pain and left shoulder pain. She was referred from Urgent Care.  Right Knee: Pain was acute onset in mid December while she was at work. She works in [employer]. There was no inciting injury she is aware of. Pain was so severe that she was unable to bear weight on the right leg. She was seen in urgent care and had x-rays performed which showed moderate right knee arthritis. She had an effusion which was aspirated of 34cc of straw colored fluid. This was sent for fluid analysis and culture. No growth on culture and no crystals seen. There was a significantly increase WBC of 36,225 w/ 77% PMNs. She has since been using a compression sleeve on the knee and taking Meloxicam. She reports after the aspiration her pain improved a little, but it is persisting despite regular NSAID use. Worse at the end of the day with accompanied swelling. Denies catching or locking sensation, though does get some intermittent instability sensation. No previous right knee injuries or surgeries.  Left Shoulder: This has been present for the past 2 months as well, though more significant since early January. Again, denies any known injury or trauma. Thinks it was a repetitive use injury at work in [employer]. She was seen at urgent care and prescribed 5d Prednisone burst and muscle relaxer though reports this didn't help at all. She has pain with ROM and + night pain. She has been limited at work because of the pain.   Pertinent ROS were reviewed with the patient and found to be negative unless otherwise specified above in HPI.   PERTINENT  PMH / PSH / FH / SH:  Past Medical, Surgical, Social, and Family History Reviewed & Updated in the EMR.  Pertinent findings include:  Left Knee Arthroscopy/Menisectomy Breast cancer (left) s/p lumpectomy, radiation and anti-estrogen therapy Rheumatoid  Arthritis on Methotrexate and Infliximab Hypertension   Allergies  Allergen Reactions   Shellfish Allergy Itching and Other (See Comments)    HIVES IF GOES OVERBOARD, HAS HAD IV DYE AND HAD NO PROBLEMS   Shrimp Extract Itching    HIVES IF GOES OVERBOARD, HAS HAD IV DYE AND HAD NO PROBLEMS   Bactrim [Sulfamethoxazole-Trimethoprim] Hives and Other (See Comments)   Chocolate Flavoring Agent (Non-Screening) Itching and Other (See Comments)    PLAIN CHOCOLATE IN ABUNDANCE    OBJECTIVE:  BP (!) 154/90   Ht 5\' 3"  (1.6 m)   Wt 200 lb (90.7 kg)   LMP 06/11/2013   BMI 35.43 kg/m   PHYSICAL EXAM:  GEN: Alert and Oriented, NAD, comfortable in exam room RESP: Unlabored respirations, symmetric chest rise PSY: normal mood, congruent affect   Right Knee Exam: No gross deformity, ecchymoses, mild swelling. TTP along medial joint line, remainder non-tender. FROM with normal strength. Negative ant/post drawers. Negative valgus/varus testing. Negative lachman.  Negative Mcmurrays and + Thessaly. + patellar grind NV intact distally.  Left Shoulder Exam: No swelling, ecchymoses. No gross deformity. TTP along greater tuberosity remainder non-tender. ROM with flexion to 170, abduction to 150, ER 50d, IR to L3 Positive Hawkins, Neers. Negative Speeds, Yergasons. Strength 5/5 with empty can (+pain) and resisted internal (+pain)/external rotation. NV intact distally.  Complete MSK [place] of Left Shoulder Patient was seated on exam table and shoulder US examination was performed using high frequency linear probe. Images saved to PACS. -Biceps tendon visualized  within the bicipital groove in both SAX and LAX with no significant hypoechoic fluid and intact tendon fibers without signs of irregularity.  -Subscapularis tendon visualized in both SAX and LAX with partial thickness articular surface tearing though majority of intact tendon fibers without signs of irregularity. Mild hypoechoic changes to the  tendon. -Supraspinatus tendon visualized in both SAX and LAX with intact tendon fibers though some fiber irregularity and hypoechoic changes. -Infraspinatus and teres minor tendons were visualized in both SAX and LAX with intact tendon fibers without signs of irregularity. No significant hypoechoic changes. -Posterior glenohumeral joint was visualized with proper alignment.  [facility] Joint was visualized with mild bursal distension bony spurs/arthritic changes. No pain with sonopalpation.    IMPRESSION: Partial thickness tear of subscapularis and supraspinatus tendinopathy. U/S performed and interpreted by Glean Salen, MD. Assessment & Plan Acute pain of left shoulder Acute on chronic pain of the left shoulder without injury. U/S findings consistent with rotator cuff tendinopathy and partial thickness surface tearing of subscapularis. I suspect this is chronic. She does have positive impingement testing on exam today.   Plan: -Given lack of improvement with NSAIDs and PO steroids I did recommend subacromial cortisone injection which she was amenable to. -Recommended limiting overhead activity/lifting and wrote work note with recommendation for light duty and no lifting > 5# for next 6 wks. -Rotator Cuff home exercises and theraband use reviewed with the patient. -Continue NSAIDs PRN -F/u in 6 weeks  Left Subacromial Injection Procedure: After informed written consent timeout was performed, patient was in seated position on exam table.  Left shoulder was prepped with alcohol swab x2. Ethyl chloride spray used for topical anesthetic. Utilizing the posterior approach and a 25g needle, the left subacromial space was injected with 3:1 lidocaine:depomedrol.  Following the injection a bandage was applied to the area. Patient tolerated procedure well without immediate complications. The patient was counseled as to the expected post-injection course, including the possibility of worsening of pain with  steroid flare. Instructed as to concerning symptoms and advised to contact the office if these should arise.  Right knee pain, unspecified chronicity Given her x-rays and fairly benign exam today I do suspect this is a flare of underlying arthritis. She did well with the knee aspiration back in December, and upon review of her cell counts the WBC of 36,225 does appear more consistent with inflammatory arthritis rather than osteoarthritis. Discussed my suspicion this is related to her underlying Rheumatoid Arthritis.   Plan: -Given lack of improvement with NSAIDs and PO steroids I did recommend intraarticular knee cortisone injection which she was amenable to. -Recommended continued compression sleeve use and prn NSAIDs/icing -F/u in 6 weeks, if failing to improve would recommend MRI to evaluate her meniscus given medial joint line pain, intermittent instability sensation, and + Thessaly.  Right Knee Injection Procedure: After informed written consent timeout was performed, patient was in seated position on exam table.  Right knee was prepped with alcohol swab x2. Ethyl chloride spray used for topical anesthetic. Utilizing anteromedial approach and a 25g needle, the right knee joint was injected with 3:1 lidocaine:depomedrol. Following the injection a bandage was applied to the area. Patient tolerated procedure well without immediate complications. The patient was counseled as to the expected post-injection course, including the possibility of worsening of pain with steroid flare. Instructed as to concerning symptoms and advised to contact the office if these should arise.   Glean Salen, MD PGY-4, Sports Medicine Fellow [facility]

## 2023-12-18 NOTE — Patient Instructions (Addendum)
You have arthritis in the right knee. We did a cortisone injection for this today.  Left shoulder there is some small partial thickness tears in the rotator cuff. We did a cortisone injection in this today. Work on the external rotation, internal rotation and "spokes of the wheel" exercises aka Jobes exercises for the left shoulder. Limit overhead lifting with the left arm to less than 5# Let's follow-up in 6-8 weeks to recheck this shoulder.   Today you received an injection with a corticosteroid (aka: cortisone injection). This injection is usually done in response to pain and inflammation. There is some "numbing medicine" (Lidocaine) in the shot, so the injected area may be numb and feel really good for the next couple of hours. The numbing medicine usually wears off in 2-3 hours, and then your pain level may be back to where it was before the injection until the cortisone starts working.    The actually benefit from the steroid injection is usually noticed within 3-5 days, but may take up to 14 days. You may actually experience a small (as in 10%) INCREASE in pain in the first 24 hours---that is common.  Things to watch out for that you should contact us or a health care provider urgently would include: 1. Unusual (as in more than 10%) increase in pain 2. New fever > 101.5 3. New swelling or redness of the injected area. 4. Streaking of red lines around the area injected.  Do not hesitate to call or reach out with any questions or concerns.

## 2024-02-24 ENCOUNTER — Ambulatory Visit (HOSPITAL_COMMUNITY)
Admission: EM | Admit: 2024-02-24 | Discharge: 2024-02-24 | Disposition: A | Discharge status: Home / Self Care | Attending: Nurse Practitioner | Admitting: Nurse Practitioner

## 2024-02-24 ENCOUNTER — Encounter (HOSPITAL_COMMUNITY): Payer: Self-pay | Admitting: Emergency Medicine

## 2024-02-24 DIAGNOSIS — M25551 Pain in right hip: Secondary | ICD-10-CM

## 2024-02-24 MED ORDER — METHYLPREDNISOLONE 4 MG PO TBPK: ORAL_TABLET | ORAL | 0 refills | Status: AC

## 2024-02-24 MED ORDER — KETOROLAC TROMETHAMINE 30 MG/ML IJ SOLN
INTRAMUSCULAR | Status: AC
  Filled 2024-02-24: qty 1

## 2024-02-24 MED ORDER — KETOROLAC TROMETHAMINE 30 MG/ML IJ SOLN
30.0000 mg | Freq: Once | INTRAMUSCULAR | Status: AC
  Administered 2024-02-24: 30 mg via INTRAMUSCULAR

## 2024-02-24 NOTE — Discharge Instructions (Addendum)
 Due to your history of arthritis.  You have been given a Toradol  30 mg IM injection in the office.  You have been prescribed Medrol  4 mg Dosepak to take as directed.  Patient is to follow-up with rheumatology for further evaluation and additional treatment options. Per your request you have a referral placed for primary care provider

## 2024-02-24 NOTE — ED Provider Notes (Signed)
 [facility]    CSN: [id number] Arrival date & time: 02/24/24  1444      History   Chief Complaint Chief Complaint  Patient presents with   Hip Pain    HPI Joanne Garrett is a 48 y.o. female.   HPI  She is in today for evaluation of her right hip pain.  She endorses that she has a history of ostoarthritis.  Her chart indicates rheumatoid arthritis.  She denies any specific activity that could have flared up her hip.  She denies any recent injury she is currently rating the pain 8 out of 10.  The pain does radiate into her groin.  She denies any numbness tingling weakness in her right lower extremity..  She denies any urinary symptoms of urgency frequency.  She endorses that she has taken ibuprofen  at home which offered minimal relief. 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 and completed radiation 09-30-2012 and completed anti-estrogen therapy   Depression    Genital herpes    Hematuria    History of hyperthyroidism 02/2001   s/p  RAI 03-2001   History of panic attacks    History of radiation therapy    completed 09-23-2012  50Gy in 25 fractions and boost completed [phone]   History of thyroid  nodule    Hypertension    Renal calculus, right    Rheumatoid arthritis (HCC)    Trigeminal neuralgia of right side of face 04/15/2019   V2   Urgency of urination     Patient Active Problem List   Diagnosis Date Noted   Weakness 05/31/2020   Snoring 05/20/2019   Insomnia 05/20/2019   Trigeminal neuralgia of right side of face 04/15/2019   MDD (major depressive disorder), recurrent, 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   Obesity (BMI 30.0-34.9) 01/31/2018   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   Rheumatoid arthritis (HCC)    PTSD (post-traumatic stress disorder) 09/17/2017   MDD (major depressive disorder), recurrent  episode, moderate (HCC) 09/17/2017   HTN (hypertension) 01/16/2017   Genital herpes 01/16/2017   History of thyroid  disorder 01/16/2017   Left thyroid  nodule 01/16/2017   Breast pain, left 01/16/2017   History of breast cancer 01/16/2017    Past Surgical History:  Procedure Laterality Date   ABDOMINAL HYSTERECTOMY     ANTERIOR CERVICAL DECOMP/DISCECTOMY FUSION  05-18-2015   [facility]   BREAST LUMPECTOMY Left 07-03-2012    [facility]   CYSTOSCOPY WITH RETROGRADE PYELOGRAM, URETEROSCOPY AND STENT PLACEMENT Right 09/09/2017   Procedure: CYSTOSCOPY WITH RIGHT RETROGRADE PYELOGRAM, URETEROSCOPY , STONE BASKETRY AND STENT PLACEMENT;  Surgeon: Samson Croak, MD;  Location: [facility];  Service: Urology;  Laterality: Right;   IR TRANSCATHETER BX  02/02/2020   IR US  GUIDE VASC ACCESS RIGHT  02/02/2020   IR VENOGRAM HEPATIC W HEMODYNAMIC EVALUATION  02/02/2020   KNEE ARTHROSCOPY Left 2009   TOTAL ABDOMINAL HYSTERECTOMY W/ BILATERAL SALPINGOOPHORECTOMY  2014    OB History   No obstetric history on file.      Home Medications    Prior to Admission medications   Medication Sig Start Date End Date Taking? Authorizing Provider  methylPREDNISolone  (MEDROL ) 4 MG TBPK tablet Follow package instructions. 02/24/24 03/01/24 Yes Gregoria Leas, NP  albuterol  (VENTOLIN  HFA) 108 (90 Base) MCG/ACT inhaler Inhale 1-2 puffs into  the lungs every 6 (six) hours as needed for wheezing or shortness of breath. 12/16/23   Levora Reas A, NP  amLODipine  (NORVASC ) 10 MG tablet Take 1 tablet (10 mg total) by mouth daily. 12/26/20   Wynetta Heckle, MD  azithromycin  (ZITHROMAX  Z-PAK) 250 MG tablet Take 2 pills (500mg ) first day and one pill (250mg ) the remaining 4 days. 12/16/23   Levora Reas A, NP  benzonatate  (TESSALON ) 100 MG capsule Take 1 capsule (100 mg total) by mouth every 8 (eight) hours. 12/16/23   Levora Reas A, NP  clindamycin (CLEOCIN T) 1 % lotion 1 application. daily. 03/05/22    [provider]  folic acid (FOLVITE) 1 MG tablet Take 1 mg by mouth daily. 10/20/18   [provider]  gabapentin  (NEURONTIN ) 300 MG capsule Take 1 capsule (300 mg total) by mouth 2 (two) times daily. 04/29/23   Dala Dublin, MD  ibuprofen  (ADVIL ) 200 MG tablet Take 200 mg by mouth every 8 (eight) hours as needed. 08/31/22   [provider]  methotrexate (RHEUMATREX) 2.5 MG tablet Take 2.5 mg by mouth once a week. 12/21/22   [provider]  Multiple Vitamin (MULTIVITAMIN) tablet Take 1 tablet by mouth daily.    [provider]  olmesartan (BENICAR) 20 MG tablet Take 20 mg by mouth daily.    [provider]  promethazine -dextromethorphan (PROMETHAZINE -DM) 6.25-15 MG/5ML syrup Take 5 mLs by mouth at bedtime as needed for cough. 12/16/23   Levora Reas A, NP  valACYclovir  (VALTREX ) 1000 MG tablet Take 1 tablet (1,000 mg total) by mouth 2 (two) times daily as needed. X 5 days during an outbreak Patient taking differently: Take 1,000 mg by mouth 2 (two) times daily as needed (for 5 days during an outbreak). 08/15/18   Henson, Vickie L, NP-C  venlafaxine  XR (EFFEXOR -XR) 150 MG 24 hr capsule Take total of 225 mg daily (150 mg + 75 mg daily) Patient taking differently: 150 mg daily with breakfast. Take total of 225 mg daily (150 mg + 75 mg daily) 08/18/20   Hisada, Ivan Marion, MD  venlafaxine  XR (EFFEXOR -XR) 75 MG 24 hr capsule 225 mg daily (150 mg + 75 mg daily) Patient taking differently: 75 mg daily with breakfast. 225 mg daily (150 mg + 75 mg daily) 08/18/20   Todd Fossa, MD    Family History Family History  Problem Relation Age of Onset   Hypertension Mother    COPD Mother    Depression Mother    Hypertension Father    Kidney disease Father    Alcohol abuse Father    Hypertension Brother    Alzheimer's disease Paternal Grandmother     Social History Social History   Tobacco Use   Smoking status: Never   Smokeless tobacco: Never   Vaping Use   Vaping status: Never Used  Substance Use Topics   Alcohol use: Yes    Alcohol/week: 1.0 standard drink of alcohol    Types: 1 Glasses of wine per week    Comment: occ   Drug use: No     Allergies   Shellfish allergy, Shrimp extract, Bactrim [sulfamethoxazole-trimethoprim], and Chocolate flavoring agent (non-screening)   Review of Systems Review of Systems   Physical Exam Triage Vital Signs ED Triage Vitals  Encounter Vitals Group     BP 02/24/24 1500 131/88     Systolic BP Percentile --      Diastolic BP Percentile --      Pulse Rate 02/24/24 1500 92  Resp 02/24/24 1500 18     Temp 02/24/24 1500 98.8 F (37.1 C)     Temp Source 02/24/24 1500 Oral     SpO2 02/24/24 1500 97 %     Weight --      Height --      Head Circumference --      Peak Flow --      Pain Score 02/24/24 1459 8     Pain Loc --      Pain Education --      Exclude from Growth Chart --    No data found.  Updated Vital Signs BP 131/88 (BP Location: Left Arm)   Pulse 92   Temp 98.8 F (37.1 C) (Oral)   Resp 18   LMP 06/11/2013   SpO2 97%   Visual Acuity Right Eye Distance:   Left Eye Distance:   Bilateral Distance:    Right Eye Near:   Left Eye Near:    Bilateral Near:     Physical Exam Constitutional:      General: She is not in acute distress.    Appearance: She is obese.  HENT:     Head: Normocephalic.  Cardiovascular:     Rate and Rhythm: Normal rate.  Pulmonary:     Effort: Pulmonary effort is normal.  Musculoskeletal:     Right hip: No bony tenderness. Normal range of motion.     Left hip: Normal.  Skin:    General: Skin is warm.     Capillary Refill: Capillary refill takes less than 2 seconds.  Neurological:     General: No focal deficit present.     Mental Status: She is alert and oriented to person, place, and time.      UC Treatments / Results  Labs (all labs ordered are listed, but only abnormal results are displayed) Labs Reviewed - No  data to display  EKG   Radiology No results found.  Procedures Procedures (including critical care time)  Medications Ordered in UC Medications  ketorolac  (TORADOL ) 30 MG/ML injection 30 mg (has no administration in time range)    Initial Impression / Assessment and Plan / UC Course  I have reviewed the triage vital signs and the nursing notes.  Pertinent labs & imaging results that were available during my care of the patient were reviewed by me and considered in my medical decision making (see chart for details).     Right hip pain Final Clinical Impressions(s) / UC Diagnoses   Final diagnoses:  Right hip pain     Discharge Instructions      Due to your history of arthritis.  You have been given a Toradol  30 mg IM injection in the office.  You have been prescribed Medrol  4 mg Dosepak to take as directed.  Patient is to follow-up with rheumatology for further evaluation and additional treatment options. Per your request you have a referral placed for primary care provider     ED Prescriptions     Medication Sig Dispense Auth. Provider   methylPREDNISolone  (MEDROL ) 4 MG TBPK tablet Follow package instructions. 21 tablet Gregoria Leas, NP      PDMP not reviewed this encounter.   Eleanore Grey [place], [place] 02/24/24 1539

## 2024-02-24 NOTE — ED Triage Notes (Signed)
 Pt c/o right hp pain that flare up last night. Denies fall or injury. Tried Ibuprofen  that didn't help. Pt reports told has osteoarthritis.

## 2024-05-03 ENCOUNTER — Encounter (HOSPITAL_COMMUNITY): Payer: Self-pay

## 2024-05-03 ENCOUNTER — Ambulatory Visit (HOSPITAL_COMMUNITY)
Admission: EM | Admit: 2024-05-03 | Discharge: 2024-05-03 | Disposition: A | Discharge status: Home / Self Care | Attending: Nurse Practitioner | Admitting: Nurse Practitioner

## 2024-05-03 DIAGNOSIS — J069 Acute upper respiratory infection, unspecified: Secondary | ICD-10-CM | POA: Diagnosis not present

## 2024-05-03 LAB — POC SARS CORONAVIRUS 2 AG -  ED: SARS Coronavirus 2 Ag: NEGATIVE

## 2024-05-03 MED ORDER — CETIRIZINE-PSEUDOEPHEDRINE ER 5-120 MG PO TB12
1.0000 | ORAL_TABLET | Freq: Every day | ORAL | 0 refills | Status: AC
Start: 2024-05-03 — End: 2024-05-13

## 2024-05-03 MED ORDER — PSEUDOEPH-BROMPHEN-DM 30-2-10 MG/5ML PO SYRP: 10.0000 mL | ORAL_SOLUTION | Freq: Four times a day (QID) | ORAL | 0 refills | Status: DC | PRN

## 2024-05-03 MED ORDER — PREDNISONE 20 MG PO TABS: 40.0000 mg | ORAL_TABLET | Freq: Every day | ORAL | 0 refills | Status: AC

## 2024-05-03 MED ORDER — FLUTICASONE PROPIONATE 50 MCG/ACT NA SUSP: 2.0000 | Freq: Every day | NASAL | 0 refills | Status: DC

## 2024-05-03 NOTE — ED Triage Notes (Signed)
 Patient c/o a non productive cough, nasal congestin, sore throat, and a headache x 3 days. Patient states no fever since yesterday.  Patient states she has been taking Robitussin and Tylenol  for her symptoms.

## 2024-05-03 NOTE — Discharge Instructions (Signed)
 You were seen today for symptoms consistent with a viral upper respiratory infection. These include cough, sore throat with a burning sensation when eating or drinking, runny nose, sneezing, and sweating. Your COVID-19 test was negative, and your symptoms do not suggest a bacterial infection at this time.  This type of illness is usually self-limiting and improves with supportive care. You should continue using your inhaler and nebulizer at home as needed for cough or chest tightness. Drink plenty of fluids, rest as much as possible, and avoid irritants like smoke or strong odors. You may use over-the-counter medications such as Tylenol  or ibuprofen  for throat discomfort or fever, throat lozenges or warm saltwater gargles to soothe your throat, and decongestants if approved by your provider.  Your symptoms should begin to improve over the next several days. Contact your primary care provider if your symptoms last longer than 7 to 10 days, worsen instead of improving, or if you develop new symptoms. Go to the emergency department if you experience shortness of breath, chest pain, high fever that doesn't respond to medication, confusion, or signs of dehydration such as dry mouth, dark urine, or feeling lightheaded.

## 2024-05-03 NOTE — ED Provider Notes (Signed)
 [facility]    CSN: [id number] Arrival date & time: 05/03/24  1557      History   Chief Complaint Chief Complaint  Patient presents with   Cough   Sore Throat   Headache    HPI Joanne Garrett is a 48 y.o. female.   Discussed the use of AI scribe software for clinical note transcription with the patient, who gave verbal consent to proceed.   Joanne Garrett is a 48 y.o. female that presents with complaints of cough, sore throat, and flu-like symptoms. Symptom onset was about 3 days ago. The patient reports experiencing sweating episodes that have been soaking her sheets. She describes a burning sensation in her throat, particularly when eating or drinking. Associated symptoms include sneezing and runny nose. The patient has not taken her temperature but reports feeling feverish. The patient denies ear pain, productive cough, shortness of breath, wheezing, nausea, vomiting, or diarrhea. She mentions having an inhaler and breathing machine at home but has not had to use them for the current illness. The patient denies smoking or vaping.   The following portions of the patient's history were reviewed and updated as appropriate: allergies, current medications, past family history, past medical history, past social history, past surgical history, and problem list.      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 and completed radiation 09-30-2012 and completed anti-estrogen therapy   Depression    Genital herpes    Hematuria    History of hyperthyroidism 02/2001   s/p  RAI 03-2001   History of panic attacks    History of radiation therapy    completed 09-23-2012  50Gy in 25 fractions and boost completed [phone]   History of thyroid  nodule    Hypertension    Renal calculus, right    Rheumatoid arthritis (HCC)    Trigeminal  neuralgia of right side of face 04/15/2019   V2   Urgency of urination     Patient Active Problem List   Diagnosis Date Noted   Weakness 05/31/2020   Snoring 05/20/2019   Insomnia 05/20/2019   Trigeminal neuralgia of right side of face 04/15/2019   MDD (major depressive disorder), recurrent, 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   Obesity (BMI 30.0-34.9) 01/31/2018   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   Rheumatoid arthritis (HCC)    PTSD (post-traumatic stress disorder) 09/17/2017   MDD (major depressive disorder), recurrent episode, moderate (HCC) 09/17/2017   HTN (hypertension) 01/16/2017   Genital herpes 01/16/2017   History of thyroid  disorder 01/16/2017   Left thyroid  nodule 01/16/2017   Breast pain, left 01/16/2017   History of breast cancer 01/16/2017    Past Surgical History:  Procedure Laterality Date   ABDOMINAL HYSTERECTOMY     ANTERIOR CERVICAL DECOMP/DISCECTOMY FUSION  05-18-2015   [facility]   BREAST LUMPECTOMY Left 07-03-2012    [facility]   CYSTOSCOPY WITH RETROGRADE PYELOGRAM, URETEROSCOPY AND STENT PLACEMENT Right 09/09/2017   Procedure: CYSTOSCOPY WITH RIGHT RETROGRADE PYELOGRAM, URETEROSCOPY , STONE BASKETRY AND STENT PLACEMENT;  Surgeon: Carolee Sherwood JONETTA DOUGLAS, MD;  Location: [facility];  Service: Urology;  Laterality: Right;   IR TRANSCATHETER BX  02/02/2020   IR US  GUIDE VASC ACCESS RIGHT  02/02/2020   IR VENOGRAM HEPATIC W HEMODYNAMIC EVALUATION  02/02/2020   KNEE ARTHROSCOPY Left 2009  TOTAL ABDOMINAL HYSTERECTOMY W/ BILATERAL SALPINGOOPHORECTOMY  2014    OB History   No obstetric history on file.      Home Medications    Prior to Admission medications   Medication Sig Start Date End Date Taking? Authorizing Provider  brompheniramine-pseudoephedrine-DM 30-2-10 MG/5ML syrup Take 10 mLs by mouth every 6 (six) hours as needed (cough and congestion). 05/03/24  Yes Amelia Macken, Lucie, FNP  cetirizine-pseudoephedrine (ZYRTEC-D) 5-120 MG  tablet Take 1 tablet by mouth daily with breakfast for 10 days. 05/03/24 05/13/24 Yes Dynisha Due, FNP  fluticasone (FLONASE) 50 MCG/ACT nasal spray Place 2 sprays into both nostrils daily. Shake well before use. Gently blow nose before spraying. Do not blow nose immediately after use. You should not taste the medication or feel it going down your throat; if you do, adjust your technique. 05/03/24  Yes Heitor Steinhoff, FNP  predniSONE  (DELTASONE ) 20 MG tablet Take 2 tablets (40 mg total) by mouth daily for 5 days. 05/03/24 05/08/24 Yes Iola Lucie, FNP  albuterol  (VENTOLIN  HFA) 108 (90 Base) MCG/ACT inhaler Inhale 1-2 puffs into the lungs every 6 (six) hours as needed for wheezing or shortness of breath. 12/16/23   Johnie Flaming A, NP  amLODipine  (NORVASC ) 10 MG tablet Take 1 tablet (10 mg total) by mouth daily. 12/26/20   Armenta Canning, MD  clindamycin (CLEOCIN T) 1 % lotion 1 application. daily. 03/05/22   [provider]  folic acid (FOLVITE) 1 MG tablet Take 1 mg by mouth daily. 10/20/18   [provider]  gabapentin  (NEURONTIN ) 300 MG capsule Take 1 capsule (300 mg total) by mouth 2 (two) times daily. 04/29/23   Rush Nest, MD  ibuprofen  (ADVIL ) 200 MG tablet Take 200 mg by mouth every 8 (eight) hours as needed. 08/31/22   [provider]  methotrexate (RHEUMATREX) 2.5 MG tablet Take 2.5 mg by mouth once a week. 12/21/22   [provider]  Multiple Vitamin (MULTIVITAMIN) tablet Take 1 tablet by mouth daily.    [provider]  olmesartan (BENICAR) 20 MG tablet Take 20 mg by mouth daily.    [provider]  valACYclovir  (VALTREX ) 1000 MG tablet Take 1 tablet (1,000 mg total) by mouth 2 (two) times daily as needed. X 5 days during an outbreak Patient taking differently: Take 1,000 mg by mouth 2 (two) times daily as needed (for 5 days during an outbreak). 08/15/18   Henson, Vickie L, NP-C  venlafaxine  XR (EFFEXOR -XR) 150 MG 24 hr  capsule Take total of 225 mg daily (150 mg + 75 mg daily) Patient taking differently: 150 mg daily with breakfast. Take total of 225 mg daily (150 mg + 75 mg daily) 08/18/20   Hisada, Katheren, MD  venlafaxine  XR (EFFEXOR -XR) 75 MG 24 hr capsule 225 mg daily (150 mg + 75 mg daily) Patient taking differently: 75 mg daily with breakfast. 225 mg daily (150 mg + 75 mg daily) 08/18/20   Vickey Katheren, MD    Family History Family History  Problem Relation Age of Onset   Hypertension Mother    COPD Mother    Depression Mother    Hypertension Father    Kidney disease Father    Alcohol abuse Father    Hypertension Brother    Alzheimer's disease Paternal Grandmother     Social History Social History   Tobacco Use   Smoking status: Never   Smokeless tobacco: Never  Vaping Use   Vaping status: Never Used  Substance Use Topics  Alcohol use: Yes    Alcohol/week: 1.0 standard drink of alcohol    Types: 1 Glasses of wine per week    Comment: occ   Drug use: No     Allergies   Shellfish allergy, Shrimp extract, Bactrim [sulfamethoxazole-trimethoprim], and Chocolate flavoring agent (non-screening)   Review of Systems Review of Systems  Constitutional:  Positive for diaphoresis and fever.  HENT:  Positive for congestion, rhinorrhea, sneezing and sore throat. Negative for ear pain.   Respiratory:  Positive for cough (nonproductive). Negative for shortness of breath and wheezing.   Gastrointestinal:  Negative for diarrhea, nausea and vomiting.  Musculoskeletal:  Negative for myalgias.  Neurological:  Positive for headaches.  All other systems reviewed and are negative.    Physical Exam Triage Vital Signs ED Triage Vitals [05/03/24 1608]  Encounter Vitals Group     BP      Girls Systolic BP Percentile      Girls Diastolic BP Percentile      Boys Systolic BP Percentile      Boys Diastolic BP Percentile      Pulse      Resp      Temp      Temp src      SpO2      Weight       Height      Head Circumference      Peak Flow      Pain Score 4     Pain Loc      Pain Education      Exclude from Growth Chart    No data found.  Updated Vital Signs LMP 06/11/2013   Visual Acuity Right Eye Distance:   Left Eye Distance:   Bilateral Distance:    Right Eye Near:   Left Eye Near:    Bilateral Near:     Physical Exam Vitals reviewed.  Constitutional:      General: She is awake. She is not in acute distress.    Appearance: Normal appearance. She is well-developed. She is not ill-appearing, toxic-appearing or diaphoretic.  HENT:     Head: Normocephalic.     Right Ear: Hearing, tympanic membrane, ear canal and external ear normal. No drainage, swelling or tenderness. No middle ear effusion. Tympanic membrane is not erythematous.     Left Ear: Hearing, tympanic membrane, ear canal and external ear normal. No drainage, swelling or tenderness.  No middle ear effusion. Tympanic membrane is not erythematous.     Nose: Congestion present. No rhinorrhea.     Mouth/Throat:     Lips: Pink.     Mouth: Mucous membranes are moist.     Pharynx: Uvula midline. Posterior oropharyngeal erythema present. No pharyngeal swelling, oropharyngeal exudate or uvula swelling.     Tonsils: No tonsillar exudate or tonsillar abscesses.   Eyes:     General: Vision grossly intact.     Conjunctiva/sclera: Conjunctivae normal.    Cardiovascular:     Rate and Rhythm: Normal rate.     Heart sounds: Normal heart sounds.  Pulmonary:     Effort: Pulmonary effort is normal. No tachypnea or respiratory distress.     Breath sounds: Normal breath sounds and air entry.   Musculoskeletal:        General: Normal range of motion.     Cervical back: Full passive range of motion without pain, normal range of motion and neck supple.  Lymphadenopathy:     Cervical: No cervical adenopathy.   Skin:  General: Skin is warm and dry.   Neurological:     General: No focal deficit present.      Mental Status: She is alert and oriented to person, place, and time.   Psychiatric:        Behavior: Behavior is cooperative.      UC Treatments / Results  Labs (all labs ordered are listed, but only abnormal results are displayed) Labs Reviewed  POC SARS CORONAVIRUS 2 AG -  ED    EKG   Radiology No results found.  Procedures Procedures (including critical care time)  Medications Ordered in UC Medications - No data to display  Initial Impression / Assessment and Plan / UC Course  I have reviewed the triage vital signs and the nursing notes.  Pertinent labs & imaging results that were available during my care of the patient were reviewed by me and considered in my medical decision making (see chart for details).     Patient presents with symptoms consistent with an upper respiratory infection, including cough, sore throat with burning sensation especially during eating or drinking, sweating, runny nose, and sneezing. The patient denies ear pain, shortness of breath, wheezing, nausea, vomiting, or diarrhea. A COVID-19 test was performed and was negative. Based on the clinical presentation and negative testing, symptoms are most consistent with a viral upper respiratory infection. Supportive care was recommended. The patient was advised to continue using her inhaler and nebulizer as needed for symptom relief and to increase fluid intake. She should rest, monitor symptoms, and seek follow-up with a primary care provider if symptoms worsen, persist beyond 7-10 days, or new symptoms develop. Emergency evaluation is warranted for difficulty breathing, persistent high fever, chest pain, or signs of dehydration.  Today's evaluation has revealed no signs of a dangerous process. Discussed diagnosis with patient and/or guardian. Patient and/or guardian aware of their diagnosis, possible red flag symptoms to watch out for and need for close follow up. Patient and/or guardian understands verbal  and written discharge instructions. Patient and/or guardian comfortable with plan and disposition.  Patient and/or guardian has a clear mental status at this time, good insight into illness (after discussion and teaching) and has clear judgment to make decisions regarding their care  Documentation was completed with the aid of voice recognition software. Transcription may contain typographical errors. Final Clinical Impressions(s) / UC Diagnoses   Final diagnoses:  Viral upper respiratory tract infection     Discharge Instructions      You were seen today for symptoms consistent with a viral upper respiratory infection. These include cough, sore throat with a burning sensation when eating or drinking, runny nose, sneezing, and sweating. Your COVID-19 test was negative, and your symptoms do not suggest a bacterial infection at this time.  This type of illness is usually self-limiting and improves with supportive care. You should continue using your inhaler and nebulizer at home as needed for cough or chest tightness. Drink plenty of fluids, rest as much as possible, and avoid irritants like smoke or strong odors. You may use over-the-counter medications such as Tylenol  or ibuprofen  for throat discomfort or fever, throat lozenges or warm saltwater gargles to soothe your throat, and decongestants if approved by your provider.  Your symptoms should begin to improve over the next several days. Contact your primary care provider if your symptoms last longer than 7 to 10 days, worsen instead of improving, or if you develop new symptoms. Go to the emergency department if you experience shortness of  breath, chest pain, high fever that doesn't respond to medication, confusion, or signs of dehydration such as dry mouth, dark urine, or feeling lightheaded.      ED Prescriptions     Medication Sig Dispense Auth. Provider   brompheniramine-pseudoephedrine-DM 30-2-10 MG/5ML syrup Take 10 mLs by mouth  every 6 (six) hours as needed (cough and congestion). 120 mL Aquita Simmering, [place], FNP   cetirizine-pseudoephedrine (ZYRTEC-D) 5-120 MG tablet Take 1 tablet by mouth daily with breakfast for 10 days. 10 tablet Ambers Iyengar, FNP   fluticasone (FLONASE) 50 MCG/ACT nasal spray Place 2 sprays into both nostrils daily. Shake well before use. Gently blow nose before spraying. Do not blow nose immediately after use. You should not taste the medication or feel it going down your throat; if you do, adjust your technique. 16 g Iola Lukes, FNP   predniSONE  (DELTASONE ) 20 MG tablet Take 2 tablets (40 mg total) by mouth daily for 5 days. 10 tablet Iola Lukes, FNP      PDMP not reviewed this encounter.   Iola Lukes, [place] 05/03/24 [phone]

## 2024-05-11 ENCOUNTER — Ambulatory Visit: Payer: Self-pay

## 2024-05-11 NOTE — Telephone Encounter (Signed)
 FYI Only or Action Required?: FYI only for provider.  Patient was last seen in primary care on N/A, new patient . Called Nurse Triage reporting Cough. Symptoms began 04/30/24. Interventions attempted: Prescription medications: albuterol  inhaler, prednisone , Zyrtec -D, cough syrup, Flonase . Symptoms are: unchanged.  Triage Disposition: See HCP Within 4 Hours (Or PCP Triage) - advised patient to do telehealth visit or return to urgent care today and patient scheduled for new patient appt next week  Patient/caregiver understands and will follow disposition?: Yes                  Copied from CRM [id number]. Topic: Clinical - Red Word Triage >> May 11, 2024 12:45 PM Elle L wrote: Red Word that prompted transfer to Nurse Triage: The patient called to establish care at [facility]. I advised her that they are not accepting new patients but she was okay with scheduling at another practice as well. However, the patient went to Urgent Care on 7/3 for an upper respiratory infection but the patient is still experiencing a productive cough with yellow mucous and congestion. Reason for Disposition  [1] MILD difficulty breathing (e.g., minimal/no SOB at rest, SOB with walking, pulse <100) AND [2] still present when not coughing  Answer Assessment - Initial Assessment Questions 1. ONSET: When did the cough begin?      04/30/24.  2. SEVERITY: How bad is the cough today?      Today it is horrible, patient states she finished the cough syrup and picked up some Robitussin OTC syrup to take. She states she is starting to c/o that her chest hurts from coughing.  3. SPUTUM: Describe the color of your sputum (none, dry cough; clear, white, yellow, green)     Yellow.  4. HEMOPTYSIS: Are you coughing up any blood? If so ask: How much? (flecks, streaks, tablespoons, etc.)     No.  5. DIFFICULTY BREATHING: Are you having difficulty breathing? If Yes, ask: How bad is  it? (e.g., mild, moderate, severe)    - MILD: No SOB at rest, mild SOB with walking, speaks normally in sentences, can lie down, no retractions, pulse < 100.    - MODERATE: SOB at rest, SOB with minimal exertion and prefers to sit, cannot lie down flat, speaks in phrases, mild retractions, audible wheezing, pulse 100-120.    - SEVERE: Very SOB at rest, speaks in single words, struggling to breathe, sitting hunched forward, retractions, pulse > 120      Just a little, she states she has been using her inhaler which has been helping. She states she has not had to use her nebulizer yet.  6. FEVER: Do you have a fever? If Yes, ask: What is your temperature, how was it measured, and when did it start?     No.  7. CARDIAC HISTORY: Do you have any history of heart disease? (e.g., heart attack, congestive heart failure)      No.  8. LUNG HISTORY: Do you have any history of lung disease?  (e.g., pulmonary embolus, asthma, emphysema)     No.  9. PE RISK FACTORS: Do you have a history of blood clots? (or: recent major surgery, recent prolonged travel, bedridden)     No.  10. OTHER SYMPTOMS: Do you have any other symptoms? (e.g., runny nose, wheezing, chest pain)       Nasal congestion.  11. PREGNANCY: Is there any chance you are pregnant? When was your last menstrual period?  Total hysterectomy.  12. TRAVEL: Have you traveled out of the country in the last month? (e.g., travel history, exposures)       No.  Protocols used: Cough - Acute Productive-A-AH

## 2024-05-12 ENCOUNTER — Other Ambulatory Visit: Payer: Self-pay

## 2024-05-12 ENCOUNTER — Ambulatory Visit
Admission: EM | Admit: 2024-05-12 | Discharge: 2024-05-12 | Disposition: A | Discharge status: Home / Self Care | Attending: Emergency Medicine | Admitting: Emergency Medicine

## 2024-05-12 ENCOUNTER — Encounter: Payer: Self-pay | Admitting: Emergency Medicine

## 2024-05-12 DIAGNOSIS — I10 Essential (primary) hypertension: Secondary | ICD-10-CM

## 2024-05-12 DIAGNOSIS — J069 Acute upper respiratory infection, unspecified: Secondary | ICD-10-CM | POA: Diagnosis not present

## 2024-05-12 MED ORDER — PREDNISONE 10 MG (21) PO TBPK: ORAL_TABLET | Freq: Every day | ORAL | 0 refills | Status: DC

## 2024-05-12 MED ORDER — AMLODIPINE BESYLATE 10 MG PO TABS: 10.0000 mg | ORAL_TABLET | Freq: Every day | ORAL | 0 refills | Status: DC

## 2024-05-12 MED ORDER — OLMESARTAN MEDOXOMIL 20 MG PO TABS: 20.0000 mg | ORAL_TABLET | Freq: Every day | ORAL | 0 refills | Status: DC

## 2024-05-12 MED ORDER — AMOXICILLIN-POT CLAVULANATE 875-125 MG PO TABS: 1.0000 | ORAL_TABLET | Freq: Two times a day (BID) | ORAL | 0 refills | Status: DC

## 2024-05-12 NOTE — Discharge Instructions (Signed)
 Symptoms have been present for 10 days therefore will initiate antibiotics this is most likely causing your symptoms to linger  Take Augmentin  twice daily for 7 days  Begin prednisone  every morning with food to open and relax the airway making it easier for you to breathe    You can take Tylenol  and/or Ibuprofen  as needed for fever reduction and pain relief.   For cough: honey 1/2 to 1 teaspoon (you can dilute the honey in water or another fluid).  You can also use guaifenesin  and dextromethorphan for cough. You can use a humidifier for chest congestion and cough.  If you don't have a humidifier, you can sit in the bathroom with the hot shower running.      For sore throat: try warm salt water gargles, cepacol lozenges, throat spray, warm tea or water with lemon/honey, popsicles or ice, or OTC cold relief medicine for throat discomfort.   For congestion: take a daily anti-histamine like Zyrtec , Claritin, and a oral decongestant, such as pseudoephedrine .  You can also use Flonase  1-2 sprays in each nostril daily.   It is important to stay hydrated: drink plenty of fluids (water, gatorade/powerade/pedialyte, juices, or teas) to keep your throat moisturized and help further relieve irritation/discomfort.   Blood pressure medication has been refilled, please keep upcoming primary care appointment for further evaluation and management, blood pressure slightly elevated today most likely due to over-the-counter cough suppressants and persistent coughing

## 2024-05-12 NOTE — ED Triage Notes (Signed)
 Pt here for refill of htn meds; sts last dose today; pt also sts still coughing and nasal congestion x 10 days

## 2024-05-12 NOTE — ED Provider Notes (Signed)
 [facility]    CSN: [id number] Arrival date & time: 05/12/24  1317      History   Chief Complaint Chief Complaint  Patient presents with   Cough   Medication Refill    HPI Joanne Garrett is a 48 y.o. female.   Patient presents for evaluation of nasal congestion, rhinorrhea, productive cough and shortness of breath with exertion present for 10 days.  Associated wheezing.  No known sick contacts.  History of seasonal allergies, denies further respiratory history.  Has attempted use of inhaler.  Evaluated in urgent care on 6/29 noted to be viral upper respiratory infection, prescribed prednisone , nasal spray and cough syrup, endorses no improvement with symptoms.    Patient requesting refill for blood pressure medication, currently taking olmesartan  and amlodipine  daily, denies missing dosage but has taken last pill this morning.  Endorses slight elevation in blood pressure, typically well-managed.  Has upcoming PCP appointment next week.  Denies all symptoms of hypertensive urgency.   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 and completed radiation 09-30-2012 and completed anti-estrogen therapy   Depression    Genital herpes    Hematuria    History of hyperthyroidism 02/2001   s/p  RAI 03-2001   History of panic attacks    History of radiation therapy    completed 09-23-2012  50Gy in 25 fractions and boost completed [phone]   History of thyroid  nodule    Hypertension    Renal calculus, right    Rheumatoid arthritis (HCC)    Trigeminal neuralgia of right side of face 04/15/2019   V2   Urgency of urination     Patient Active Problem List   Diagnosis Date Noted   Weakness 05/31/2020   Snoring 05/20/2019   Insomnia 05/20/2019   Trigeminal neuralgia of right side of face 04/15/2019   MDD (major depressive disorder),  recurrent, 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   Obesity (BMI 30.0-34.9) 01/31/2018   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   Rheumatoid arthritis (HCC)    PTSD (post-traumatic stress disorder) 09/17/2017   MDD (major depressive disorder), recurrent episode, moderate (HCC) 09/17/2017   HTN (hypertension) 01/16/2017   Genital herpes 01/16/2017   History of thyroid  disorder 01/16/2017   Left thyroid  nodule 01/16/2017   Breast pain, left 01/16/2017   History of breast cancer 01/16/2017    Past Surgical History:  Procedure Laterality Date   ABDOMINAL HYSTERECTOMY     ANTERIOR CERVICAL DECOMP/DISCECTOMY FUSION  05-18-2015   [facility]   BREAST LUMPECTOMY Left 07-03-2012    [facility]   CYSTOSCOPY WITH RETROGRADE PYELOGRAM, URETEROSCOPY AND STENT PLACEMENT Right 09/09/2017   Procedure: CYSTOSCOPY WITH RIGHT RETROGRADE PYELOGRAM, URETEROSCOPY , STONE BASKETRY AND STENT PLACEMENT;  Surgeon: Carolee Sherwood JONETTA DOUGLAS, MD;  Location: [facility];  Service: Urology;  Laterality: Right;   IR TRANSCATHETER BX  02/02/2020   IR US  GUIDE VASC ACCESS RIGHT  02/02/2020   IR VENOGRAM HEPATIC W HEMODYNAMIC EVALUATION  02/02/2020   KNEE ARTHROSCOPY Left 2009   TOTAL ABDOMINAL HYSTERECTOMY W/ BILATERAL SALPINGOOPHORECTOMY  2014    OB History   No obstetric history on file.      Home Medications    Prior to Admission medications   Medication Sig Start Date End Date Taking? Authorizing Provider  albuterol  (VENTOLIN  HFA) 108 (90 Base) MCG/ACT  inhaler Inhale 1-2 puffs into the lungs every 6 (six) hours as needed for wheezing or shortness of breath. 12/16/23   Johnie Flaming A, NP  amLODipine  (NORVASC ) 10 MG tablet Take 1 tablet (10 mg total) by mouth daily. 12/26/20   Armenta Canning, MD  brompheniramine-pseudoephedrine -DM 30-2-10 MG/5ML syrup Take 10 mLs by mouth every 6 (six) hours as needed (cough and congestion). 05/03/24   Iola Lukes, FNP  cetirizine -pseudoephedrine  (ZYRTEC -D) 5-120 MG  tablet Take 1 tablet by mouth daily with breakfast for 10 days. 05/03/24 05/13/24  Murrill, Samantha, FNP  clindamycin (CLEOCIN T) 1 % lotion 1 application. daily. 03/05/22   [provider]  fluticasone  (FLONASE ) 50 MCG/ACT nasal spray Place 2 sprays into both nostrils daily. Shake well before use. Gently blow nose before spraying. Do not blow nose immediately after use. You should not taste the medication or feel it going down your throat; if you do, adjust your technique. 05/03/24   Iola Lukes, FNP  folic acid (FOLVITE) 1 MG tablet Take 1 mg by mouth daily. 10/20/18   [provider]  gabapentin  (NEURONTIN ) 300 MG capsule Take 1 capsule (300 mg total) by mouth 2 (two) times daily. 04/29/23   Rush Nest, MD  ibuprofen  (ADVIL ) 200 MG tablet Take 200 mg by mouth every 8 (eight) hours as needed. 08/31/22   [provider]  methotrexate (RHEUMATREX) 2.5 MG tablet Take 2.5 mg by mouth once a week. 12/21/22   [provider]  Multiple Vitamin (MULTIVITAMIN) tablet Take 1 tablet by mouth daily.    [provider]  olmesartan  (BENICAR ) 20 MG tablet Take 20 mg by mouth daily.    [provider]  valACYclovir  (VALTREX ) 1000 MG tablet Take 1 tablet (1,000 mg total) by mouth 2 (two) times daily as needed. X 5 days during an outbreak Patient taking differently: Take 1,000 mg by mouth 2 (two) times daily as needed (for 5 days during an outbreak). 08/15/18   Henson, Vickie L, NP-C  venlafaxine  XR (EFFEXOR -XR) 150 MG 24 hr capsule Take total of 225 mg daily (150 mg + 75 mg daily) Patient taking differently: 150 mg daily with breakfast. Take total of 225 mg daily (150 mg + 75 mg daily) 08/18/20   Hisada, Katheren, MD  venlafaxine  XR (EFFEXOR -XR) 75 MG 24 hr capsule 225 mg daily (150 mg + 75 mg daily) Patient taking differently: 75 mg daily with breakfast. 225 mg daily (150 mg + 75 mg daily) 08/18/20   Vickey Katheren, MD    Family History Family History   Problem Relation Age of Onset   Hypertension Mother    COPD Mother    Depression Mother    Hypertension Father    Kidney disease Father    Alcohol abuse Father    Hypertension Brother    Alzheimer's disease Paternal Grandmother     Social History Social History   Tobacco Use   Smoking status: Never   Smokeless tobacco: Never  Vaping Use   Vaping status: Never Used  Substance Use Topics   Alcohol use: Yes    Alcohol/week: 1.0 standard drink of alcohol    Types: 1 Glasses of wine per week    Comment: occ   Drug use: No     Allergies   Shellfish allergy, Shrimp extract, Bactrim [sulfamethoxazole-trimethoprim], and Chocolate flavoring agent (non-screening)   Review of Systems Review of Systems  HENT:  Positive for congestion and rhinorrhea. Negative for dental problem, drooling, ear discharge, ear pain, facial swelling,  hearing loss, mouth sores, nosebleeds, postnasal drip, sinus pressure, sinus pain, sneezing, sore throat, tinnitus, trouble swallowing and voice change.   Respiratory:  Positive for cough and shortness of breath. Negative for apnea, choking, chest tightness and stridor.   Gastrointestinal: Negative.      Physical Exam Triage Vital Signs ED Triage Vitals  Encounter Vitals Group     BP 05/12/24 1340 (!) 166/91     Girls Systolic BP Percentile --      Girls Diastolic BP Percentile --      Boys Systolic BP Percentile --      Boys Diastolic BP Percentile --      Pulse Rate 05/12/24 1340 98     Resp 05/12/24 1340 18     Temp 05/12/24 1340 99 F (37.2 C)     Temp Source 05/12/24 1340 Oral     SpO2 05/12/24 1340 95 %     Weight --      Height --      Head Circumference --      Peak Flow --      Pain Score 05/12/24 1341 0     Pain Loc --      Pain Education --      Exclude from Growth Chart --    No data found.  Updated Vital Signs BP (!) 166/91 (BP Location: Left Arm)   Pulse 98   Temp 99 F (37.2 C) (Oral)   Resp 18   LMP 06/11/2013    SpO2 95%   Visual Acuity Right Eye Distance:   Left Eye Distance:   Bilateral Distance:    Right Eye Near:   Left Eye Near:    Bilateral Near:     Physical Exam Constitutional:      Appearance: Normal appearance.  HENT:     Head: Normocephalic.     Right Ear: Tympanic membrane, ear canal and external ear normal.     Left Ear: Tympanic membrane, ear canal and external ear normal.     Nose: Congestion present.     Mouth/Throat:     Pharynx: No posterior oropharyngeal erythema.     Tonsils: 3+ on the right. 3+ on the left.  Cardiovascular:     Rate and Rhythm: Normal rate and regular rhythm.     Pulses: Normal pulses.     Heart sounds: Normal heart sounds.  Pulmonary:     Effort: Pulmonary effort is normal.     Breath sounds: Normal breath sounds.  Musculoskeletal:     Cervical back: Normal range of motion and neck supple.  Neurological:     Mental Status: She is alert and oriented to person, place, and time. Mental status is at baseline.      UC Treatments / Results  Labs (all labs ordered are listed, but only abnormal results are displayed) Labs Reviewed - No data to display  EKG   Radiology No results found.  Procedures Procedures (including critical care time)  Medications Ordered in UC Medications - No data to display  Initial Impression / Assessment and Plan / UC Course  I have reviewed the triage vital signs and the nursing notes.  Pertinent labs & imaging results that were available during my care of the patient were reviewed by me and considered in my medical decision making (see chart for details).  Acute URI, elevated blood pressure reading with diagnosis of hypertension  Patient is in no signs of distress nor toxic appearing.  Vital signs are stable.  Low suspicion for pneumonia, pneumothorax or bronchitis and therefore will defer imaging.  COVID testing completed initial visit, negative, without repeat.  Prescribed Augmentin , prednisone  and  guaifenesin  codeine , PDMP reviewed, low risk.May use additional over-the-counter medications as needed for supportive care.  May follow-up with urgent care as needed if symptoms persist or worsen.  Blood pressure in triage 166/91, persistently coughing in exam room and has been using over-the-counter medication most likely source, refilled olmesartan  and amlodipine  for 30-day supply, advised to keep upcoming PCP appointment for further management, no signs of hypertensive urgency at this time Final Clinical Impressions(s) / UC Diagnoses   Final diagnoses:  Acute URI  Elevated blood pressure reading with diagnosis of hypertension   Discharge Instructions   None    ED Prescriptions   None    PDMP not reviewed this encounter.   Teresa Shelba SAUNDERS, NP 05/12/24 [phone]

## 2024-05-14 ENCOUNTER — Telehealth: Payer: Self-pay

## 2024-05-14 MED ORDER — AZITHROMYCIN 250 MG PO TABS
250.0000 mg | ORAL_TABLET | Freq: Every day | ORAL | 0 refills | Status: AC
Start: 2024-05-14 — End: ?

## 2024-05-14 MED ORDER — AMOXICILLIN-POT CLAVULANATE 875-125 MG PO TABS
1.0000 | ORAL_TABLET | Freq: Two times a day (BID) | ORAL | 0 refills | Status: DC
Start: 2024-05-14 — End: 2024-05-14

## 2024-05-14 MED ORDER — DOXYCYCLINE HYCLATE 100 MG PO CAPS
100.0000 mg | ORAL_CAPSULE | Freq: Two times a day (BID) | ORAL | 0 refills | Status: DC
Start: 2024-05-14 — End: 2024-05-14

## 2024-05-14 NOTE — Telephone Encounter (Signed)
 Patient stopped by Urgent Care to let provider known (any) that the Augmentin  recently prescribed can not be taken with the Methotrexate (current dose 6 of 2.5 mg weekly), and would like Rx change.  WENDI Dixon CMA  Discussed/sent to provider R. Billy RIGGERS (in clinic).

## 2024-05-14 NOTE — Telephone Encounter (Signed)
 Antibiotic changed to azithromycin  given concern for interaction between both augmentin  and doxycycline  with methotrexate. Encouraged follow up if no gradual improvement or with any further concerns.

## 2024-05-20 ENCOUNTER — Ambulatory Visit: Payer: Self-pay

## 2024-05-20 ENCOUNTER — Ambulatory Visit

## 2024-05-20 ENCOUNTER — Ambulatory Visit (INDEPENDENT_AMBULATORY_CARE_PROVIDER_SITE_OTHER)

## 2024-05-20 ENCOUNTER — Other Ambulatory Visit: Payer: Self-pay

## 2024-05-20 VITALS — BP 118/78 | HR 94 | Temp 99.2°F | Resp 16 | Ht 63.0 in | Wt 209.4 lb

## 2024-05-20 DIAGNOSIS — M05742 Rheumatoid arthritis with rheumatoid factor of left hand without organ or systems involvement: Secondary | ICD-10-CM

## 2024-05-20 DIAGNOSIS — Z7689 Persons encountering health services in other specified circumstances: Secondary | ICD-10-CM

## 2024-05-20 DIAGNOSIS — R053 Chronic cough: Secondary | ICD-10-CM | POA: Diagnosis not present

## 2024-05-20 DIAGNOSIS — F3342 Major depressive disorder, recurrent, in full remission: Secondary | ICD-10-CM | POA: Diagnosis not present

## 2024-05-20 DIAGNOSIS — M05741 Rheumatoid arthritis with rheumatoid factor of right hand without organ or systems involvement: Secondary | ICD-10-CM

## 2024-05-20 DIAGNOSIS — F4310 Post-traumatic stress disorder, unspecified: Secondary | ICD-10-CM | POA: Diagnosis not present

## 2024-05-20 MED ORDER — VENLAFAXINE HCL ER 75 MG PO CP24
ORAL_CAPSULE | ORAL | 1 refills | Status: AC
Start: 2024-05-20 — End: ?

## 2024-05-20 NOTE — Progress Notes (Signed)
 Patient ID: Joanne Garrett, female    DOB: 1976/07/06  MRN: [id number]  CC: Establish Care (Patient is here to established care with provider./~health hx address/~care gaps address/ Patient request referral to psychiatry/ )   Subjective: Joanne Garrett is a 48 y.o. female history of major depressive disorder, rheumatoid arthritis and hypertension who presents to clinic to establish care. Her concerns today include: 3-week history of worsening cough, producing yellow phlegm.  Associated symptoms include intermittent runny nose.  Is currently on day 5/6 of azithromycin . Denies hemoptysis, wheezing or shortness of breath. Her prescription of venlafaxine  ran out and has not been taking her medication in the past month. Has noticed her mood has been affected, she feels more anxious.  Denies episodes of extreme sadness, difficulty performing everyday tasks, or low energy.  Rheumatoid Arthritis is well controlled on current medication regimen but would like referral to new rheumatologist.  Patient Active Problem List   Diagnosis Date Noted   Weakness 05/31/2020   Snoring 05/20/2019   Insomnia 05/20/2019   Trigeminal neuralgia of right side of face 04/15/2019   MDD (major depressive disorder), recurrent, 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   Obesity (BMI 30.0-34.9) 01/31/2018   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   Rheumatoid arthritis (HCC)    PTSD (post-traumatic stress disorder) 09/17/2017   MDD (major depressive disorder), recurrent episode, moderate (HCC) 09/17/2017   HTN (hypertension) 01/16/2017   Genital herpes 01/16/2017   History of thyroid  disorder 01/16/2017   Left thyroid  nodule 01/16/2017   Breast pain, left 01/16/2017   History of breast cancer 01/16/2017     Current Outpatient Medications on File Prior to Visit  Medication Sig Dispense Refill   albuterol  (VENTOLIN  HFA) 108 (90 Base) MCG/ACT inhaler Inhale 1-2 puffs into the lungs every 6 (six) hours  as needed for wheezing or shortness of breath. 8.5 g 0   amLODipine  (NORVASC ) 10 MG tablet Take 1 tablet (10 mg total) by mouth daily. 30 tablet 0   azithromycin  (ZITHROMAX ) 250 MG tablet Take 1 tablet (250 mg total) by mouth daily. Take first 2 tablets together, then 1 every day until finished. 6 tablet 0   brompheniramine-pseudoephedrine -DM 30-2-10 MG/5ML syrup Take 10 mLs by mouth every 6 (six) hours as needed (cough and congestion). 120 mL 0   fluticasone  (FLONASE ) 50 MCG/ACT nasal spray Place 2 sprays into both nostrils daily. Shake well before use. Gently blow nose before spraying. Do not blow nose immediately after use. You should not taste the medication or feel it going down your throat; if you do, adjust your technique. 16 g 0   folic acid (FOLVITE) 1 MG tablet Take 1 mg by mouth daily.     gabapentin  (NEURONTIN ) 300 MG capsule Take 1 capsule (300 mg total) by mouth 2 (two) times daily. 60 capsule 5   ibuprofen  (ADVIL ) 200 MG tablet Take 200 mg by mouth every 8 (eight) hours as needed.     methotrexate (RHEUMATREX) 2.5 MG tablet Take 2.5 mg by mouth once a week.     Multiple Vitamin (MULTIVITAMIN) tablet Take 1 tablet by mouth daily.     olmesartan  (BENICAR ) 20 MG tablet Take 1 tablet (20 mg total) by mouth daily. 30 tablet 0   predniSONE  (STERAPRED UNI-PAK 21 TAB) 10 MG (21) TBPK tablet Take by mouth daily. Take 6 tabs by mouth daily  for 1 days, then 5 tabs for 1 days, then 4 tabs for 1 days, then 3 tabs for 1 days, 2  tabs for 1 days, then 1 tab by mouth daily for 1 days 21 tablet 0   valACYclovir  (VALTREX ) 1000 MG tablet Take 1 tablet (1,000 mg total) by mouth 2 (two) times daily as needed. X 5 days during an outbreak (Patient taking differently: Take 1,000 mg by mouth 2 (two) times daily as needed (for 5 days during an outbreak).) 30 tablet 2   No current facility-administered medications on file prior to visit.    Allergies  Allergen Reactions   Shellfish Allergy Itching and Other  (See Comments)    HIVES IF GOES OVERBOARD, HAS HAD IV DYE AND HAD NO PROBLEMS   Shrimp Extract Itching    HIVES IF GOES OVERBOARD, HAS HAD IV DYE AND HAD NO PROBLEMS   Bactrim [Sulfamethoxazole-Trimethoprim] Hives and Other (See Comments)   Chocolate Flavoring Agent (Non-Screening) Itching and Other (See Comments)    PLAIN CHOCOLATE IN ABUNDANCE    Social History   Socioeconomic History   Marital status: Single    Spouse name: Not on file   Number of children: Not on file   Years of education: Not on file   Highest education level: Not on file  Occupational History   Not on file  Tobacco Use   Smoking status: Never   Smokeless tobacco: Never  Vaping Use   Vaping status: Never Used  Substance and Sexual Activity   Alcohol use: Yes    Alcohol/week: 1.0 standard drink of alcohol    Types: 1 Glasses of wine per week    Comment: occ   Drug use: No   Sexual activity: Not Currently    Birth control/protection: Surgical  Other Topics Concern   Not on file  Social History Narrative   Not on file   Social Drivers of Health   Financial Resource Strain: Low Risk  (05/20/2024)   Overall Financial Resource Strain (CARDIA)    Difficulty of Paying Living Expenses: Not very hard  Food Insecurity: No Food Insecurity (05/20/2024)   Hunger Vital Sign    Worried About Running Out of Food in the Last Year: Never true    Ran Out of Food in the Last Year: Never true  Transportation Needs: No Transportation Needs (05/20/2024)   PRAPARE - Administrator, Civil Service (Medical): No    Lack of Transportation (Non-Medical): No  Physical Activity: Insufficiently Active (05/20/2024)   Exercise Vital Sign    Days of Exercise per Week: 2 days    Minutes of Exercise per Session: 30 min  Stress: No Stress Concern Present (05/20/2024)   [employer] of Occupational Health - Occupational Stress Questionnaire    Feeling of Stress: Not at all  Social Connections: Moderately Integrated  (05/20/2024)   Social Connection and Isolation Panel    Frequency of Communication with Friends and Family: Three times a week    Frequency of Social Gatherings with Friends and Family: Three times a week    Attends Religious Services: More than 4 times per year    Active Member of Clubs or Organizations: Yes    Attends Banker Meetings: 1 to 4 times per year    Marital Status: Never married  Intimate Partner Violence: Not At Risk (05/20/2024)   Humiliation, Afraid, Rape, and Kick questionnaire    Fear of Current or Ex-Partner: No    Emotionally Abused: No    Physically Abused: No    Sexually Abused: No    Family History  Problem Relation Age of Onset  Hypertension Mother    COPD Mother    Depression Mother    Hypertension Father    Kidney disease Father    Alcohol abuse Father    Hypertension Brother    Alzheimer's disease Paternal Grandmother     Past Surgical History:  Procedure Laterality Date   ABDOMINAL HYSTERECTOMY     ANTERIOR CERVICAL DECOMP/DISCECTOMY FUSION  05-18-2015   [facility]   BREAST LUMPECTOMY Left 07-03-2012    [facility]   CYSTOSCOPY WITH RETROGRADE PYELOGRAM, URETEROSCOPY AND STENT PLACEMENT Right 09/09/2017   Procedure: CYSTOSCOPY WITH RIGHT RETROGRADE PYELOGRAM, URETEROSCOPY , STONE BASKETRY AND STENT PLACEMENT;  Surgeon: Carolee Sherwood JONETTA DOUGLAS, MD;  Location: [facility];  Service: Urology;  Laterality: Right;   IR TRANSCATHETER BX  02/02/2020   IR US  GUIDE VASC ACCESS RIGHT  02/02/2020   IR VENOGRAM HEPATIC W HEMODYNAMIC EVALUATION  02/02/2020   KNEE ARTHROSCOPY Left 2009   TOTAL ABDOMINAL HYSTERECTOMY W/ BILATERAL SALPINGOOPHORECTOMY  2014    ROS: Review of Systems  Constitutional:  Negative for activity change and fever.  HENT:  Positive for rhinorrhea. Negative for sinus pressure and sinus pain.   Respiratory:  Negative for chest tightness and shortness of breath.   Cardiovascular:  Negative for chest pain.  Psychiatric/Behavioral:   Negative for agitation, confusion, dysphoric mood and hallucinations.    Negative except as stated above  PHYSICAL EXAM: BP 118/78   Pulse 94   Temp 99.2 F (37.3 C) (Oral)   Resp 16   Ht 5' 3 (1.6 m)   Wt 209 lb 6.4 oz (95 kg)   LMP 06/11/2013   SpO2 96%   BMI 37.09 kg/m   Physical Exam  General appearance - alert, well appearing, and in no distress Mouth - mucous membranes moist, pharynx normal without lesions and No oropharynx erythema or masses. Chest - clear to auscultation, no wheezes, rales or rhonchi, symmetric air entry Heart - normal rate, regular rhythm, normal S1, S2, no murmurs, rubs, clicks or gallops Extremities - peripheral pulses normal, no pedal edema, no clubbing or cyanosis    ASSESSMENT AND PLAN:  1. Encounter to establish care with new provider (Primary) - Plan to follow up for physical and yearly labs  2. MDD (major depressive disorder), recurrent, in full remission (HCC) - Mood is stable in clinic, behavior is appropriate, normal speech - Has been on current medication regimen for over 2.5 years - Regularly sees a therapist  - Refill placed for venlafaxine  XR (EFFEXOR -XR) 75 MG 24 hr capsule; 225 mg daily (150 mg + 75 mg daily)  Dispense: 90 capsule; Refill: 1  3. Persistent cough for 3 weeks or longer - Chest X-ray 2 View - I reviewed images and did not note any opacities or signs of pneumonia. - I also reviewed the radiologist report and agree with findings.  - Continue Azythromycin 250mg  tablet as prescribed at urgent care visit on 7/10 - Continue Prednisone  taper as directed from urgent care - Use over the counter medication and cough drops for cough suppression.   4. PTSD (post-traumatic stress disorder) - See above # 2.  5. Rheumatoid Arthritis - Continue medication regimen per Rheumatology recommendations - Referral placed for transfer to a new Rheumatology specialist per pt's request.    Patient was given the opportunity to ask  questions.  Patient verbalized understanding of the plan and was able to repeat key elements of the plan.     Requested Prescriptions   Signed Prescriptions Disp Refills  venlafaxine  XR (EFFEXOR -XR) 75 MG 24 hr capsule 90 capsule 1    Sig: 225 mg daily (150 mg + 75 mg daily)    Return in about 4 weeks (around 06/17/2024) for Physical/Labs.  Sula Leavy Rode, [name]

## 2024-05-22 ENCOUNTER — Other Ambulatory Visit: Payer: Self-pay

## 2024-05-22 MED ORDER — VENLAFAXINE HCL ER 150 MG PO CP24: 150.0000 mg | ORAL_CAPSULE | Freq: Every day | ORAL | 1 refills | Status: AC

## 2024-05-22 NOTE — Progress Notes (Signed)
 Prescription ordered to continue current regimen of Effexor  225mg  ( 150mg  + 75 mg)   Sula Cower Mcihael Hinderman [name]

## 2024-06-15 ENCOUNTER — Other Ambulatory Visit: Payer: Self-pay

## 2024-06-15 DIAGNOSIS — I10 Essential (primary) hypertension: Secondary | ICD-10-CM

## 2024-06-15 NOTE — Telephone Encounter (Signed)
 Copied from CRM #[id number]. Topic: Clinical - Medication Refill >> Jun 15, 2024 12:24 PM Zebedee SAUNDERS wrote: Medication: amLODipine  (NORVASC ) 10 MG tablet  Has the patient contacted their pharmacy? Yes (Agent: If no, request that the patient contact the pharmacy for the refill. If patient does not wish to contact the pharmacy document the reason why and proceed with request.) (Agent: If yes, when and what did the pharmacy advise?)  This is the patient's preferred pharmacy:  [id number] - [place], [place] - [address] AT [facility] OF [address] & [address] [place] [address] Phone: [phone] Fax: [phone]  Is this the correct pharmacy for this prescription? Yes If no, delete pharmacy and type the correct one.   Has the prescription been filled recently? Yes  Is the patient out of the medication? Yes  Has the patient been seen for an appointment in the last year OR does the patient have an upcoming appointment? Yes  Can we respond through MyChart? Yes  Agent: Please be advised that Rx refills may take up to 3 business days. We ask that you follow-up with your pharmacy.

## 2024-06-18 ENCOUNTER — Other Ambulatory Visit: Payer: Self-pay

## 2024-06-18 MED ORDER — AMLODIPINE BESYLATE 10 MG PO TABS: 10.0000 mg | ORAL_TABLET | Freq: Every day | ORAL | 2 refills | Status: AC

## 2024-06-18 NOTE — Telephone Encounter (Signed)
 Requested medication (s) are due for refill today: yes  Requested medication (s) are on the active medication list: yes  Last refill:  05/12/24 #30  Future visit scheduled: yes  Notes to clinic:  last prescribed at [facility] visit by A. White NP    Requested Prescriptions  Pending Prescriptions Disp Refills   amLODipine  (NORVASC ) 10 MG tablet 30 tablet 0    Sig: Take 1 tablet (10 mg total) by mouth daily.     Cardiovascular: Calcium Channel Blockers 2 Passed - 06/18/2024 12:38 PM      Passed - Last BP in normal range    BP Readings from Last 1 Encounters:  05/20/24 118/78         Passed - Last Heart Rate in normal range    Pulse Readings from Last 1 Encounters:  05/20/24 94         Passed - Valid encounter within last 6 months    Recent Outpatient Visits           4 weeks ago Encounter to establish care with new provider   [facility] Leavy Rode, [place], [name]

## 2024-06-18 NOTE — Progress Notes (Signed)
 Entered by error

## 2024-06-19 ENCOUNTER — Encounter

## 2024-07-08 ENCOUNTER — Other Ambulatory Visit: Payer: Self-pay

## 2024-07-08 ENCOUNTER — Ambulatory Visit (HOSPITAL_COMMUNITY)
Admission: EM | Admit: 2024-07-08 | Discharge: 2024-07-08 | Disposition: A | Discharge status: Home / Self Care | Attending: Family Medicine | Admitting: Family Medicine

## 2024-07-08 ENCOUNTER — Encounter (HOSPITAL_COMMUNITY): Payer: Self-pay | Admitting: Emergency Medicine

## 2024-07-08 DIAGNOSIS — R102 Pelvic and perineal pain: Secondary | ICD-10-CM | POA: Diagnosis present

## 2024-07-08 DIAGNOSIS — R350 Frequency of micturition: Secondary | ICD-10-CM

## 2024-07-08 DIAGNOSIS — M545 Low back pain, unspecified: Secondary | ICD-10-CM | POA: Insufficient documentation

## 2024-07-08 DIAGNOSIS — Z87442 Personal history of urinary calculi: Secondary | ICD-10-CM | POA: Diagnosis present

## 2024-07-08 LAB — POCT URINALYSIS DIP (MANUAL ENTRY)
Bilirubin, UA: NEGATIVE
Glucose, UA: NEGATIVE mg/dL
Ketones, POC UA: NEGATIVE mg/dL
Nitrite, UA: NEGATIVE
Protein Ur, POC: 30 mg/dL — AB
Spec Grav, UA: 1.015 (ref 1.010–1.025)
Urobilinogen, UA: 0.2 U/dL
pH, UA: 6 (ref 5.0–8.0)

## 2024-07-08 MED ORDER — KETOROLAC TROMETHAMINE 30 MG/ML IJ SOLN
30.0000 mg | Freq: Once | INTRAMUSCULAR | Status: AC
  Administered 2024-07-08: 30 mg via INTRAMUSCULAR

## 2024-07-08 MED ORDER — TAMSULOSIN HCL 0.4 MG PO CAPS: 0.4000 mg | ORAL_CAPSULE | Freq: Every day | ORAL | 0 refills | Status: DC

## 2024-07-08 MED ORDER — CEPHALEXIN 500 MG PO CAPS: 500.0000 mg | ORAL_CAPSULE | Freq: Two times a day (BID) | ORAL | 0 refills | Status: AC

## 2024-07-08 MED ORDER — KETOROLAC TROMETHAMINE 30 MG/ML IJ SOLN
INTRAMUSCULAR | Status: AC
Start: 2024-07-08 — End: 2024-07-08
  Filled 2024-07-08: qty 1

## 2024-07-08 NOTE — ED Triage Notes (Signed)
 Symptoms started one week ago.  Symptoms include right flank pain, right lower abdominal pressure.  Also complains of frequent urination.    Has not had any medications for this

## 2024-07-08 NOTE — ED Provider Notes (Incomplete)
 [facility]    CSN: [id number] Arrival date & time: 07/08/24  1923      History   Chief Complaint Chief Complaint  Patient presents with  . Urinary Tract Infection    HPI Joanne Garrett is a 48 y.o. female.   1 week Low back right low back No flank pain History of kidney stones Low back radiates into the right lower abdomen Pressure with urination No burning Pressure to suprapubic region History of total hysteromyotomy Urinary frequency  Drinks lots of water  Drinks soda every so often 2-3 times per week  Fever/chills, n/v, recent antibiotics no  Feels kind of similar to UTI in the past Feels like it could be kidney stones  Been over a year since she passed a kidney stone      Urinary Tract Infection   Past Medical History:  Diagnosis Date  . Anxiety   . 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 Depression   . Genital herpes   . Hematuria   . History of hyperthyroidism 02/2001   s/p  RAI 03-2001  . History of panic attacks   . History of radiation therapy    completed 09-23-2012  50Gy in 25 fractions and boost completed [phone]  . History of thyroid  nodule   . Hypertension   . Renal calculus, right   . Rheumatoid arthritis (HCC)   . Trigeminal neuralgia of right side of face 04/15/2019   V2  . Urgency of urination     Patient Active Problem List   Diagnosis Date Noted  . Weakness 05/31/2020  . Snoring 05/20/2019  . Insomnia 05/20/2019  . Trigeminal neuralgia of right side of face 04/15/2019  . MDD (major depressive disorder), recurrent, 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  . Obesity (BMI 30.0-34.9) 01/31/2018  . 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  . Rheumatoid arthritis (HCC)   . PTSD (post-traumatic stress disorder) 09/17/2017  . MDD (major depressive  disorder), recurrent episode, moderate (HCC) 09/17/2017  . HTN (hypertension) 01/16/2017  . Genital herpes 01/16/2017  . History of thyroid  disorder 01/16/2017  . Left thyroid  nodule 01/16/2017  . Breast pain, left 01/16/2017  . History of breast cancer 01/16/2017    Past Surgical History:  Procedure Laterality Date  . ABDOMINAL HYSTERECTOMY    . ANTERIOR CERVICAL DECOMP/DISCECTOMY FUSION  05-18-2015   [facility]  . BREAST LUMPECTOMY Left 07-03-2012    [facility]  . CYSTOSCOPY WITH RETROGRADE PYELOGRAM, URETEROSCOPY AND STENT PLACEMENT Right 09/09/2017   Procedure: CYSTOSCOPY WITH RIGHT RETROGRADE PYELOGRAM, URETEROSCOPY , STONE BASKETRY AND STENT PLACEMENT;  Surgeon: Carolee Sherwood JONETTA DOUGLAS, MD;  Location: [facility];  Service: Urology;  Laterality: Right;  . IR TRANSCATHETER BX  02/02/2020  . IR US  GUIDE VASC ACCESS RIGHT  02/02/2020  . IR VENOGRAM HEPATIC W HEMODYNAMIC EVALUATION  02/02/2020  . KNEE ARTHROSCOPY Left 2009  . TOTAL ABDOMINAL HYSTERECTOMY W/ BILATERAL SALPINGOOPHORECTOMY  2014    OB History   No obstetric history on file.      Home Medications    Prior to Admission medications   Medication Sig Start Date End Date Taking? Authorizing Provider  albuterol  (VENTOLIN  HFA) 108 (90 Base) MCG/ACT inhaler Inhale 1-2 puffs into the lungs every 6 (six) hours as needed for wheezing or shortness of breath. 12/16/23   Johnie Rumaldo LABOR, NP  amLODipine  (NORVASC ) 10 MG tablet Take 1 tablet (10 mg total) by mouth daily. 06/18/24   [name], [name]  azithromycin  (ZITHROMAX ) 250 MG tablet Take 1 tablet (250 mg total) by mouth daily. Take first 2 tablets together, then 1 every day until finished. 05/14/24   Billy Asberry FALCON, [name]  brompheniramine-pseudoephedrine -DM 30-2-10 MG/5ML syrup Take 10 mLs by mouth every 6 (six) hours as needed (cough and congestion). 05/03/24   Iola Lukes, FNP  fluticasone  (FLONASE ) 50 MCG/ACT nasal spray Place 2 sprays into both nostrils daily.  Shake well before use. Gently blow nose before spraying. Do not blow nose immediately after use. You should not taste the medication or feel it going down your throat; if you do, adjust your technique. 05/03/24   Iola Lukes, FNP  folic acid (FOLVITE) 1 MG tablet Take 1 mg by mouth daily. 10/20/18   [provider]  gabapentin  (NEURONTIN ) 300 MG capsule Take 1 capsule (300 mg total) by mouth 2 (two) times daily. 04/29/23   Rush Nest, MD  ibuprofen  (ADVIL ) 200 MG tablet Take 200 mg by mouth every 8 (eight) hours as needed. 08/31/22   [provider]  methotrexate (RHEUMATREX) 2.5 MG tablet Take 2.5 mg by mouth once a week. 12/21/22   [provider]  Multiple Vitamin (MULTIVITAMIN) tablet Take 1 tablet by mouth daily.    [provider]  olmesartan  (BENICAR ) 20 MG tablet Take 1 tablet (20 mg total) by mouth daily. 05/12/24   White, Shelba SAUNDERS, NP  predniSONE  (STERAPRED UNI-PAK 21 TAB) 10 MG (21) TBPK tablet Take by mouth daily. Take 6 tabs by mouth daily  for 1 days, then 5 tabs for 1 days, then 4 tabs for 1 days, then 3 tabs for 1 days, 2 tabs for 1 days, then 1 tab by mouth daily for 1 days 05/12/24   Teresa Shelba SAUNDERS, NP  valACYclovir  (VALTREX ) 1000 MG tablet Take 1 tablet (1,000 mg total) by mouth 2 (two) times daily as needed. X 5 days during an outbreak Patient taking differently: Take 1,000 mg by mouth 2 (two) times daily as needed (for 5 days during an outbreak). 08/15/18   Henson, Vickie L, NP-C  venlafaxine  XR (EFFEXOR  XR) 150 MG 24 hr capsule Take 1 capsule (150 mg total) by mouth daily with breakfast. Take 225mg  daily (150mg  +75 mg) 05/22/24   Leavy Lucas, Fox, [name]  venlafaxine  XR (EFFEXOR -XR) 75 MG 24 hr capsule 225 mg daily (150 mg + 75 mg daily) 05/20/24   [name], [name]    Family History Family History  Problem Relation Age of Onset  . Hypertension Mother   . COPD Mother   . Depression Mother   . Hypertension Father   . Kidney  disease Father   . Alcohol abuse Father   . Hypertension Brother   . Alzheimer's disease Paternal Grandmother     Social History Social History   Tobacco Use  . Smoking status: Never  . Smokeless tobacco: Never  Vaping Use  . Vaping status: Never Used  Substance Use Topics  . Alcohol use: Yes    Alcohol/week: 1.0 standard drink of alcohol    Types: 1 Glasses of wine per week    Comment: occ  . Drug use: No     Allergies   Shellfish allergy, Shrimp extract, Bactrim [sulfamethoxazole-trimethoprim], and Chocolate flavoring agent (non-screening)   Review of Systems Review of Systems   Physical Exam Triage Vital Signs ED Triage Vitals  Encounter  Vitals Group     BP 07/08/24 2010 119/83     Girls Systolic BP Percentile --      Girls Diastolic BP Percentile --      Boys Systolic BP Percentile --      Boys Diastolic BP Percentile --      Pulse Rate 07/08/24 2010 91     Resp 07/08/24 2010 20     Temp 07/08/24 2010 98.4 F (36.9 C)     Temp Source 07/08/24 2010 Oral     SpO2 07/08/24 2010 98 %     Weight --      Height --      Head Circumference --      Peak Flow --      Pain Score 07/08/24 2008 1     Pain Loc --      Pain Education --      Exclude from Growth Chart --    No data found.  Updated Vital Signs BP 119/83 (BP Location: Right Arm)   Pulse 91   Temp 98.4 F (36.9 C) (Oral)   Resp 20   LMP 06/11/2013   SpO2 98%   Visual Acuity Right Eye Distance:   Left Eye Distance:   Bilateral Distance:    Right Eye Near:   Left Eye Near:    Bilateral Near:     Physical Exam   UC Treatments / Results  Labs (all labs ordered are listed, but only abnormal results are displayed) Labs Reviewed  POCT URINALYSIS DIP (MANUAL ENTRY)    EKG   Radiology No results found.  Procedures Procedures (including critical care time)  Medications Ordered in UC Medications - No data to display  Initial Impression / Assessment and Plan / UC Course  I have  reviewed the triage vital signs and the nursing notes.  Pertinent labs & imaging results that were available during my care of the patient were reviewed by me and considered in my medical decision making (see chart for details).     *** Final Clinical Impressions(s) / UC Diagnoses   Final diagnoses:  Dysuria   Discharge Instructions   None    ED Prescriptions   None    PDMP not reviewed this encounter.

## 2024-07-08 NOTE — Discharge Instructions (Addendum)
 I suspect that you have a urinary tract infection versus a kidney stone.  I would like for you to hold your blood pressure medication for the next 2 to 3 days while you take Flomax  once daily to help you pass potential kidney stone.  We gave you a shot of ketorolac  in the clinic which is a strong anti-inflammatory medication to help with pain. Do not take any ibuprofen  for the next 24 hours.  Take cephalexin  antibiotic every 12 hours for 7 days to treat suspected urinary tract infection.  Your urine culture is pending.  If you develop difficulty urinating, decreased urine output, a lot of blood in your urine that you are able to see, or nausea/vomiting that does not improve in 24 hours, please go to the nearest emergency room for further workup and evaluation. Otherwise, follow-up with your primary care provider. Drink lots of fluids to stay well-hydrated.

## 2024-07-09 ENCOUNTER — Other Ambulatory Visit: Payer: Self-pay | Admitting: Family

## 2024-07-09 DIAGNOSIS — R928 Other abnormal and inconclusive findings on diagnostic imaging of breast: Secondary | ICD-10-CM

## 2024-07-09 DIAGNOSIS — N6489 Other specified disorders of breast: Secondary | ICD-10-CM

## 2024-07-10 ENCOUNTER — Ambulatory Visit (HOSPITAL_COMMUNITY): Payer: Self-pay

## 2024-07-10 LAB — URINE CULTURE: Culture: <10000 — AB

## 2024-07-15 ENCOUNTER — Ambulatory Visit (INDEPENDENT_AMBULATORY_CARE_PROVIDER_SITE_OTHER)

## 2024-07-15 VITALS — BP 122/81 | HR 81 | Temp 98.5°F | Resp 16 | Ht 63.0 in | Wt 201.6 lb

## 2024-07-15 DIAGNOSIS — Z0000 Encounter for general adult medical examination without abnormal findings: Secondary | ICD-10-CM

## 2024-07-15 DIAGNOSIS — Z130 Encounter for screening for diseases of the blood and blood-forming organs and certain disorders involving the immune mechanism: Secondary | ICD-10-CM

## 2024-07-15 DIAGNOSIS — Z1329 Encounter for screening for other suspected endocrine disorder: Secondary | ICD-10-CM

## 2024-07-15 DIAGNOSIS — Z131 Encounter for screening for diabetes mellitus: Secondary | ICD-10-CM

## 2024-07-15 DIAGNOSIS — Z114 Encounter for screening for human immunodeficiency virus [HIV]: Secondary | ICD-10-CM | POA: Diagnosis not present

## 2024-07-15 DIAGNOSIS — Z136 Encounter for screening for cardiovascular disorders: Secondary | ICD-10-CM

## 2024-07-15 DIAGNOSIS — Z13228 Encounter for screening for other metabolic disorders: Secondary | ICD-10-CM | POA: Diagnosis not present

## 2024-07-15 DIAGNOSIS — Z1159 Encounter for screening for other viral diseases: Secondary | ICD-10-CM

## 2024-07-15 DIAGNOSIS — Z13220 Encounter for screening for lipoid disorders: Secondary | ICD-10-CM

## 2024-07-15 NOTE — Progress Notes (Unsigned)
 Patient ID: Joanne Garrett, female    DOB: 01-07-1976  MRN: [id number]  CC: Annual Exam (-Patient is here to have annually  complete physical examination /-Care gap address/-labs taken )   Subjective: Joanne Garrett is a 48 y.o. female with past medical history of hypertension who presents for annual physical. No acute concerns. 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) 04/17/2023   Primary osteoarthritis of left knee 06/26/2022   Anxiety 05/31/2022   DCIS (ductal carcinoma in situ) 05/31/2022   Hyperlipidemia 05/31/2022   Arthralgia of both knees 12/16/2021   Headache 06/13/2020   Weakness 05/31/2020   Herpes simplex 01/06/2020   Snoring 05/20/2019   Insomnia 05/20/2019   Trigeminal neuralgia of right side of face 04/15/2019   Pain in left foot 02/04/2019   MDD (major depressive disorder), recurrent, 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   Pain in finger of right hand 11/13/2018   Pain of cervical spine 08/22/2018   Neck pain 08/12/2018   Obesity (BMI 30.0-34.9) 01/31/2018   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   Rheumatoid arthritis (HCC)    Pain in joint of right shoulder 01/27/2018   PTSD (post-traumatic stress disorder) 09/17/2017   MDD (major depressive disorder), recurrent episode, moderate (HCC) 09/17/2017   History of total hysterectomy 04/05/2017   HTN (hypertension) 01/16/2017   Genital herpes 01/16/2017   History of thyroid  disorder 01/16/2017   Left thyroid  nodule 01/16/2017   Breast pain, left 01/16/2017   History of breast cancer 01/16/2017   Low serum vitamin B12 04/11/2016   Abnormal mammogram 08/31/2014   Allergic rhinitis due to pollen 08/31/2014   Carpal tunnel syndrome 08/31/2014   Fibroids 08/31/2014   Heavy menstrual bleeding 08/31/2014   Hematuria 08/31/2014   Infiltrating ductal carcinoma of breast (HCC)  08/31/2014   Irregular uterine bleeding 08/31/2014   Mastitis in female 08/31/2014   Pain in joint, pelvic region and thigh 08/31/2014   Pain in joint of left shoulder 08/31/2014   Pain in joint involving multiple sites 08/31/2014   Pelvic and perineal pain 08/31/2014   Encounter for long-term (current) use of high-risk medication 05/26/2014   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     Current Outpatient Medications on File Prior to Visit  Medication Sig Dispense Refill   albuterol  (PROVENTIL ) (5 MG/ML) 0.5% nebulizer solution Inhalation     albuterol  (VENTOLIN  HFA) 108 (90 Base) MCG/ACT inhaler Inhale 1-2 puffs into the lungs every 6 (six) hours as needed for wheezing or shortness of breath. 8.5 g 0   albuterol  (VENTOLIN  HFA) 108 (90 Base) MCG/ACT inhaler INHALE 1 TO 2 PUFFS BY MOUTH EVERY 6 HOURS AS NEEDED FOR WHEEZING OR SHORTNESS OF BREATH     amLODipine  (NORVASC ) 10 MG tablet Take 1 tablet (10 mg total) by mouth daily. 90 tablet 2   fluticasone  (FLONASE ) 50 MCG/ACT nasal spray Place 2 sprays into both nostrils daily. Shake well before use. Gently blow nose before spraying. Do not blow nose immediately after use. You should not taste the medication or feel it going down your throat; if you do, adjust your technique. 16 g 0   folic acid (FOLVITE) 1 MG tablet Take 1 mg by mouth daily.     gabapentin  (NEURONTIN ) 300 MG capsule Take 1 capsule (300 mg total) by mouth 2 (two) times daily. 60 capsule 5   ibuprofen  (  ADVIL ) 200 MG tablet Take 200 mg by mouth every 8 (eight) hours as needed.     methotrexate (RHEUMATREX) 2.5 MG tablet Take 2.5 mg by mouth once a week.     Multiple Vitamin (MULTIVITAMIN) tablet Take 1 tablet by mouth daily.     olmesartan  (BENICAR ) 20 MG tablet Take 1 tablet (20 mg total) by mouth daily. 30 tablet 0   tamsulosin  (FLOMAX ) 0.4 MG CAPS capsule Take 1 capsule (0.4 mg total) by mouth daily. 5 capsule 0   valACYclovir  (VALTREX ) 1000 MG tablet Take 1 tablet  (1,000 mg total) by mouth 2 (two) times daily as needed. X 5 days during an outbreak (Patient taking differently: Take 1,000 mg by mouth 2 (two) times daily as needed (for 5 days during an outbreak).) 30 tablet 2   venlafaxine  XR (EFFEXOR  XR) 150 MG 24 hr capsule Take 1 capsule (150 mg total) by mouth daily with breakfast. Take 225mg  daily (150mg  +75 mg) 90 capsule 1   venlafaxine  XR (EFFEXOR -XR) 75 MG 24 hr capsule 225 mg daily (150 mg + 75 mg daily) 90 capsule 1   azithromycin  (ZITHROMAX ) 250 MG tablet Take 1 tablet (250 mg total) by mouth daily. Take first 2 tablets together, then 1 every day until finished. 6 tablet 0   brompheniramine-pseudoephedrine -DM 30-2-10 MG/5ML syrup Take 10 mLs by mouth every 6 (six) hours as needed (cough and congestion). 120 mL 0   predniSONE  (STERAPRED UNI-PAK 21 TAB) 10 MG (21) TBPK tablet Take by mouth daily. Take 6 tabs by mouth daily  for 1 days, then 5 tabs for 1 days, then 4 tabs for 1 days, then 3 tabs for 1 days, 2 tabs for 1 days, then 1 tab by mouth daily for 1 days 21 tablet 0   No current facility-administered medications on file prior to visit.    Allergies  Allergen Reactions   Shellfish Allergy Itching and Other (See Comments)    HIVES IF GOES OVERBOARD, HAS HAD IV DYE AND HAD NO PROBLEMS   Shrimp Extract Itching    HIVES IF GOES OVERBOARD, HAS HAD IV DYE AND HAD NO PROBLEMS   Bactrim [Sulfamethoxazole-Trimethoprim] Hives and Other (See Comments)   Chocolate Flavoring Agent (Non-Screening) Itching and Other (See Comments)    PLAIN CHOCOLATE IN ABUNDANCE    Social History   Socioeconomic History   Marital status: Single    Spouse name: Not on file   Number of children: Not on file   Years of education: Not on file   Highest education level: Not on file  Occupational History   Not on file  Tobacco Use   Smoking status: Never   Smokeless tobacco: Never  Vaping Use   Vaping status: Never Used  Substance and Sexual Activity   Alcohol  use: Yes    Alcohol/week: 1.0 standard drink of alcohol    Types: 1 Glasses of wine per week    Comment: occ   Drug use: No   Sexual activity: Not Currently    Birth control/protection: Surgical  Other Topics Concern   Not on file  Social History Narrative   Not on file   Social Drivers of Health   Financial Resource Strain: Low Risk  (05/20/2024)   Overall Financial Resource Strain (CARDIA)    Difficulty of Paying Living Expenses: Not very hard  Food Insecurity: No Food Insecurity (05/20/2024)   Hunger Vital Sign    Worried About Running Out of Food in the Last Year: Never true  Ran Out of Food in the Last Year: Never true  Transportation Needs: No Transportation Needs (05/20/2024)   PRAPARE - Administrator, Civil Service (Medical): No    Lack of Transportation (Non-Medical): No  Physical Activity: Insufficiently Active (05/20/2024)   Exercise Vital Sign    Days of Exercise per Week: 2 days    Minutes of Exercise per Session: 30 min  Stress: No Stress Concern Present (05/20/2024)   [employer] of Occupational Health - Occupational Stress Questionnaire    Feeling of Stress: Not at all  Social Connections: Moderately Integrated (05/20/2024)   Social Connection and Isolation Panel    Frequency of Communication with Friends and Family: Three times a week    Frequency of Social Gatherings with Friends and Family: Three times a week    Attends Religious Services: More than 4 times per year    Active Member of Clubs or Organizations: Yes    Attends Banker Meetings: 1 to 4 times per year    Marital Status: Never married  Intimate Partner Violence: Not At Risk (05/20/2024)   Humiliation, Afraid, Rape, and Kick questionnaire    Fear of Current or Ex-Partner: No    Emotionally Abused: No    Physically Abused: No    Sexually Abused: No    Family History  Problem Relation Age of Onset   Hypertension Mother    COPD Mother    Depression Mother     Hypertension Father    Kidney disease Father    Alcohol abuse Father    Hypertension Brother    Alzheimer's disease Paternal Grandmother     Past Surgical History:  Procedure Laterality Date   ABDOMINAL HYSTERECTOMY     ANTERIOR CERVICAL DECOMP/DISCECTOMY FUSION  05-18-2015   [facility]   BREAST LUMPECTOMY Left 07-03-2012    [facility]   CYSTOSCOPY WITH RETROGRADE PYELOGRAM, URETEROSCOPY AND STENT PLACEMENT Right 09/09/2017   Procedure: CYSTOSCOPY WITH RIGHT RETROGRADE PYELOGRAM, URETEROSCOPY , STONE BASKETRY AND STENT PLACEMENT;  Surgeon: Carolee Sherwood JONETTA DOUGLAS, MD;  Location: [facility] [place];  Service: Urology;  Laterality: Right;   IR TRANSCATHETER BX  02/02/2020   IR US  GUIDE VASC ACCESS RIGHT  02/02/2020   IR VENOGRAM HEPATIC W HEMODYNAMIC EVALUATION  02/02/2020   KNEE ARTHROSCOPY Left 2009   TOTAL ABDOMINAL HYSTERECTOMY W/ BILATERAL SALPINGOOPHORECTOMY  2014    ROS: Review of Systems Negative except as stated above  PHYSICAL EXAM: BP 122/81   Pulse 81   Temp 98.5 F (36.9 C) (Oral)   Resp 16   Ht 5' 3 (1.6 m)   Wt 201 lb 9.6 oz (91.4 kg)   LMP 06/11/2013   SpO2 97%   BMI 35.71 kg/m   Physical Exam  General: well-appearing, no acute distress Skin: no jaundice, rashes, or lesions Head: normocephalic, no lesions, no abnormal hair distribution or loss Eyes: anicteric sclera, pupils equally round and reactive to light and accommodation, extraocular movements intact Ears: no external lesions, tympanic membrane translucent  Nose: no septal deviation, turbinates clear Throat: trachea midline, no thyromegaly, moist mucus membranes Cardiovascular: regular heart rate and rhythm, normal S1/S2, no murmurs, gallops, or rubs, peripheral pulses 2+ bilaterally Chest: no skeletal deformity, lungs clear to auscultation bilaterally, equal breath sounds bilaterally Abdomen: soft, non-distended, non-tender to palpation, no hepatomegaly, no splenomegaly, normoactive bowel sounds GU:  deferred Musculoskeletal: strength of upper and lower extremities equal bilaterally, 5/5, normal gait Extremities: no peripheral edema, nails intact Neurologic: cranial nerves II-XII intact,  brisk patellar reflexes   ASSESSMENT AND PLAN:  1. Encounter for annual wellness visit (Primary) - Complete physical exam performed today, no abnormal findings. - Routine labs to screen for heme, cardiovascular and metabolic disorders.  2. Encounter for screening for HIV - HIV Antibody (routine testing w rflx)  3. Encounter for hepatitis C screening test for low risk patient - Hepatitis C antibody  4. Screening for metabolic disorder - Comprehensive metabolic panel with GFR  5. Screening for blood disease - CBC  6. Encounter for lipid screening for cardiovascular disease - Lipid panel  7. Thyroid  disorder screen - TSH Rfx on Abnormal to Free T4  8. Diabetes mellitus screening - Hemoglobin A1c    Patient was given the opportunity to ask questions.  Patient verbalized understanding of the plan and was able to repeat key elements of the plan.   Orders Placed This Encounter  Procedures   Hemoglobin A1c   HIV Antibody (routine testing w rflx)   Hepatitis C antibody   TSH Rfx on Abnormal to Free T4   CBC   Comprehensive metabolic panel with GFR   Lipid panel     Requested Prescriptions    No prescriptions requested or ordered in this encounter    Return in about 3 months (around 10/14/2024) for follow-up, med refills.  Joanne Leavy Rode, [name]

## 2024-07-16 LAB — CBC
Hematocrit: 40.4 % (ref 34.0–46.6)
Hemoglobin: 12.6 g/dL (ref 11.1–15.9)
MCH: 27.3 pg (ref 26.6–33.0)
MCHC: 31.2 g/dL — ABNORMAL LOW (ref 31.5–35.7)
MCV: 88 fL (ref 79–97)
Platelets: 289 x10E3/uL (ref 150–450)
RBC: 4.61 x10E6/uL (ref 3.77–5.28)
RDW: 14.1 % (ref 11.7–15.4)
WBC: 6.9 x10E3/uL (ref 3.4–10.8)

## 2024-07-16 LAB — COMPREHENSIVE METABOLIC PANEL WITH GFR
ALT: 21 IU/L (ref 0–32)
AST: 24 IU/L (ref 0–40)
Albumin: 4.3 g/dL (ref 3.9–4.9)
Alkaline Phosphatase: 87 IU/L (ref 44–121)
BUN/Creatinine Ratio: 22 (ref 9–23)
BUN: 17 mg/dL (ref 6–24)
Bilirubin Total: 0.4 mg/dL (ref 0.0–1.2)
CO2: 24 mmol/L (ref 20–29)
Calcium: 9.3 mg/dL (ref 8.7–10.2)
Chloride: 103 mmol/L (ref 96–106)
Creatinine, Ser: 0.76 mg/dL (ref 0.57–1.00)
Globulin, Total: 2.8 g/dL (ref 1.5–4.5)
Glucose: 91 mg/dL (ref 70–99)
Potassium: 3.5 mmol/L (ref 3.5–5.2)
Sodium: 143 mmol/L (ref 134–144)
Total Protein: 7.1 g/dL (ref 6.0–8.5)
eGFR: 96 mL/min/1.73 (ref >59)

## 2024-07-16 LAB — HIV ANTIBODY (ROUTINE TESTING W REFLEX): HIV Screen 4th Generation wRfx: NONREACTIVE

## 2024-07-16 LAB — LIPID PANEL
Chol/HDL Ratio: 2.7 ratio (ref 0.0–4.4)
Cholesterol, Total: 238 mg/dL — ABNORMAL HIGH (ref 100–199)
HDL: 88 mg/dL (ref >39)
LDL Chol Calc (NIH): 139 mg/dL — ABNORMAL HIGH (ref 0–99)
Triglycerides: 68 mg/dL (ref 0–149)
VLDL Cholesterol Cal: 11 mg/dL (ref 5–40)

## 2024-07-16 LAB — HEMOGLOBIN A1C
Est. average glucose Bld gHb Est-mCnc: 117 mg/dL
Hgb A1c MFr Bld: 5.7 % — ABNORMAL HIGH (ref 4.8–5.6)

## 2024-07-16 LAB — HEPATITIS C ANTIBODY: Hep C Virus Ab: NONREACTIVE

## 2024-07-16 LAB — TSH RFX ON ABNORMAL TO FREE T4: TSH: 1.14 u[IU]/mL (ref 0.450–4.500)

## 2024-07-17 ENCOUNTER — Ambulatory Visit
Admission: RE | Admit: 2024-07-17 | Discharge: 2024-07-17 | Disposition: A | Discharge status: Home / Self Care | Source: Ambulatory Visit | Attending: Family | Admitting: Family

## 2024-07-17 ENCOUNTER — Other Ambulatory Visit: Payer: Self-pay | Admitting: Family

## 2024-07-17 DIAGNOSIS — N6332 Unspecified lump in axillary tail of the left breast: Secondary | ICD-10-CM

## 2024-07-17 DIAGNOSIS — R928 Other abnormal and inconclusive findings on diagnostic imaging of breast: Secondary | ICD-10-CM

## 2024-07-17 DIAGNOSIS — N6489 Other specified disorders of breast: Secondary | ICD-10-CM

## 2024-07-21 ENCOUNTER — Ambulatory Visit: Payer: Self-pay

## 2024-07-21 NOTE — Progress Notes (Signed)
 I wanted to go over your test results:    - TSH which looks at your thyroid  function is within Normal range.    - Comprehensive metabolic panel which looks at your electrolytes, kidney and liver health shows all values within Normal ranges.   - CBC which looks at your blood count shows all values within Normal ranges.   - Lipid Panel which looks at your cholesterol shows elevated LDL bad cholesterol. Try to increase exercise to 150 minutes of moderate intensity exercise per week. Try to limit your intake of fatty and fried foods such as fast food.    - Hepatitis C and HIV screening tests were negative. You do NOT have either of the diseases.

## 2024-07-28 ENCOUNTER — Emergency Department (HOSPITAL_COMMUNITY)
Admission: EM | Admit: 2024-07-28 | Discharge: 2024-07-29 | Disposition: A | Discharge status: Home / Self Care | Attending: Emergency Medicine | Admitting: Emergency Medicine

## 2024-07-28 ENCOUNTER — Other Ambulatory Visit: Payer: Self-pay

## 2024-07-28 DIAGNOSIS — M1711 Unilateral primary osteoarthritis, right knee: Secondary | ICD-10-CM | POA: Diagnosis not present

## 2024-07-28 DIAGNOSIS — M25561 Pain in right knee: Secondary | ICD-10-CM | POA: Diagnosis present

## 2024-07-28 NOTE — ED Triage Notes (Signed)
 Pt reports right knee pain and swelling that started today while walking at work. Denies injury. Has taken tylenol  and ibuprofen  without relief. Report she had fluid build in the same knee in the past.

## 2024-07-29 ENCOUNTER — Emergency Department (HOSPITAL_COMMUNITY)

## 2024-07-29 ENCOUNTER — Other Ambulatory Visit: Payer: Self-pay

## 2024-07-29 ENCOUNTER — Ambulatory Visit: Admitting: Family Medicine

## 2024-07-29 ENCOUNTER — Ambulatory Visit (INDEPENDENT_AMBULATORY_CARE_PROVIDER_SITE_OTHER): Admitting: Family Medicine

## 2024-07-29 VITALS — BP 159/91 | Ht 63.0 in | Wt 200.0 lb

## 2024-07-29 DIAGNOSIS — M25561 Pain in right knee: Secondary | ICD-10-CM

## 2024-07-29 MED ORDER — METHYLPREDNISOLONE ACETATE 40 MG/ML IJ SUSP
40.0000 mg | Freq: Once | INTRAMUSCULAR | Status: AC
  Administered 2024-07-29: 40 mg via INTRA_ARTICULAR

## 2024-07-29 MED ORDER — OXYCODONE-ACETAMINOPHEN 5-325 MG PO TABS
1.0000 | ORAL_TABLET | Freq: Once | ORAL | Status: AC
  Administered 2024-07-29: 1 via ORAL
  Filled 2024-07-29: qty 1

## 2024-07-29 MED ORDER — IBUPROFEN 200 MG PO TABS
400.0000 mg | ORAL_TABLET | Freq: Once | ORAL | Status: AC | PRN
  Administered 2024-07-29: 400 mg via ORAL
  Filled 2024-07-29: qty 2

## 2024-07-29 MED ORDER — KETOROLAC TROMETHAMINE 15 MG/ML IJ SOLN
15.0000 mg | Freq: Once | INTRAMUSCULAR | Status: AC
  Administered 2024-07-29: 15 mg via INTRAMUSCULAR
  Filled 2024-07-29: qty 1

## 2024-07-29 NOTE — ED Provider Notes (Signed)
 [place] [facility] DEPARTMENT AT [facility] Provider Note   CSN: [id number] Arrival date & time: 07/28/24  2329     Patient presents with: Knee Pain (R)   Joanne Garrett is a 48 y.o. female.   48 yo female with right knee pain. Last steroid injection was 6 months ago. States she was at work tonight when she developed sudden severe pain in her anterior right knee without known trauma. Ambulatory with a cane.        Prior to Admission medications   Medication Sig Start Date End Date Taking? Authorizing Provider  albuterol  (PROVENTIL ) (5 MG/ML) 0.5% nebulizer solution Inhalation 12/21/22   [provider]  albuterol  (VENTOLIN  HFA) 108 (90 Base) MCG/ACT inhaler Inhale 1-2 puffs into the lungs every 6 (six) hours as needed for wheezing or shortness of breath. 12/16/23   Johnie Flaming A, NP  albuterol  (VENTOLIN  HFA) 108 (90 Base) MCG/ACT inhaler INHALE 1 TO 2 PUFFS BY MOUTH EVERY 6 HOURS AS NEEDED FOR WHEEZING OR SHORTNESS OF BREATH    [provider]  amLODipine  (NORVASC ) 10 MG tablet Take 1 tablet (10 mg total) by mouth daily. 06/18/24   [name], [name]  azithromycin  (ZITHROMAX ) 250 MG tablet Take 1 tablet (250 mg total) by mouth daily. Take first 2 tablets together, then 1 every day until finished. 05/14/24   Billy Asberry FALCON, [name]  brompheniramine-pseudoephedrine -DM 30-2-10 MG/5ML syrup Take 10 mLs by mouth every 6 (six) hours as needed (cough and congestion). 05/03/24   Iola Lukes, FNP  fluticasone  (FLONASE ) 50 MCG/ACT nasal spray Place 2 sprays into both nostrils daily. Shake well before use. Gently blow nose before spraying. Do not blow nose immediately after use. You should not taste the medication or feel it going down your throat; if you do, adjust your technique. 05/03/24   Iola Lukes, FNP  folic acid (FOLVITE) 1 MG tablet Take 1 mg by mouth daily. 10/20/18   [provider]  gabapentin  (NEURONTIN ) 300 MG capsule  Take 1 capsule (300 mg total) by mouth 2 (two) times daily. 04/29/23   Rush Nest, MD  ibuprofen  (ADVIL ) 200 MG tablet Take 200 mg by mouth every 8 (eight) hours as needed. 08/31/22   [provider]  methotrexate (RHEUMATREX) 2.5 MG tablet Take 2.5 mg by mouth once a week. 12/21/22   [provider]  Multiple Vitamin (MULTIVITAMIN) tablet Take 1 tablet by mouth daily.    [provider]  olmesartan  (BENICAR ) 20 MG tablet Take 1 tablet (20 mg total) by mouth daily. 05/12/24   White, Shelba SAUNDERS, NP  predniSONE  (STERAPRED UNI-PAK 21 TAB) 10 MG (21) TBPK tablet Take by mouth daily. Take 6 tabs by mouth daily  for 1 days, then 5 tabs for 1 days, then 4 tabs for 1 days, then 3 tabs for 1 days, 2 tabs for 1 days, then 1 tab by mouth daily for 1 days 05/12/24   Teresa Shelba SAUNDERS, NP  tamsulosin  (FLOMAX ) 0.4 MG CAPS capsule Take 1 capsule (0.4 mg total) by mouth daily. 07/08/24   Enedelia Dorna HERO, FNP  valACYclovir  (VALTREX ) 1000 MG tablet Take 1 tablet (1,000 mg total) by mouth 2 (two) times daily as needed. X 5 days during an outbreak Patient taking differently: Take 1,000 mg by mouth 2 (two) times daily as needed (for 5 days during an outbreak). 08/15/18   Henson, Vickie L, NP-C  venlafaxine  XR (EFFEXOR  XR) 150 MG 24 hr capsule Take 1 capsule (150 mg  total) by mouth daily with breakfast. Take 225mg  daily (150mg  +75 mg) 05/22/24   Leavy Rode, Sula, [name]  venlafaxine  XR (EFFEXOR -XR) 75 MG 24 hr capsule 225 mg daily (150 mg + 75 mg daily) 05/20/24   Tapia Cedeno, Jorge, [name]    Allergies: Shellfish allergy, Shrimp extract, Bactrim [sulfamethoxazole-trimethoprim], and Chocolate flavoring agent (non-screening)    Review of Systems Negative except as per HPI Updated Vital Signs BP (!) 181/105   Pulse 100   Temp 99.2 F (37.3 C) (Oral)   Resp 20   Ht 5' 3 (1.6 m)   Wt 90.7 kg   LMP 06/11/2013   SpO2 97%   BMI 35.43 kg/m   Physical Exam Vitals and nursing note  reviewed.  Constitutional:      General: She is not in acute distress.    Appearance: She is well-developed. She is not diaphoretic.  HENT:     Head: Normocephalic and atraumatic.  Cardiovascular:     Pulses: Normal pulses.  Pulmonary:     Effort: Pulmonary effort is normal.  Musculoskeletal:        General: Swelling and tenderness present. No deformity.     Right hip: Normal.     Right knee: No ecchymosis, bony tenderness or crepitus. Decreased range of motion. Tenderness present over the medial joint line and lateral joint line. Normal pulse.     Right ankle: Normal.     Comments: No calf tenderness or palpable cords. No erythema. Unable to flex or extend the right knee secondary to pain.   Skin:    General: Skin is warm and dry.     Findings: No erythema or rash.  Neurological:     Mental Status: She is alert and oriented to person, place, and time.     Sensory: No sensory deficit.  Psychiatric:        Behavior: Behavior normal.     (all labs ordered are listed, but only abnormal results are displayed) Labs Reviewed - No data to display  EKG: None  Radiology: DG Knee Complete 4 Views Right Result Date: 07/29/2024 CLINICAL DATA:  Pain, swelling EXAM: RIGHT KNEE - COMPLETE 4+ VIEW COMPARISON:  10/23/2023 FINDINGS: Moderate tricompartment degenerative changes with joint space narrowing and spurring, most pronounced in the lateral compartment. Moderate joint effusion. No acute bony abnormality. Specifically, no fracture, subluxation, or dislocation. IMPRESSION: Moderate tricompartment degenerative changes with moderate joint effusion. No acute bony abnormality. Electronically Signed   By: Franky Crease M.D.   On: 07/29/2024 00:31     Procedures   Medications Ordered in the ED  ibuprofen  (ADVIL ) tablet 400 mg (400 mg Oral Given 07/29/24 0009)  oxyCODONE -acetaminophen  (PERCOCET/ROXICET) 5-325 MG per tablet 1 tablet (1 tablet Oral Given 07/29/24 0350)  ketorolac  (TORADOL ) 15  MG/ML injection 15 mg (15 mg Intramuscular Given 07/29/24 0355)                                    Medical Decision Making Amount and/or Complexity of Data Reviewed Radiology: ordered.  Risk OTC drugs. Prescription drug management.   48 year old female presents with complaint of pain in her right knee.  Patient with history of arthritis in the knee, was told at 1 point that she would need surgery in the knee and then told she did not need surgery.  She last received a cortisone injection in her knee 6 months ago and has not been back  to orthopedics since that time.  Patient was work [employer] when she developed sudden severe pain in her knee, does not recall injuring or twisting the knee.  She did not fall.  Patient has been ambulatory with her cane.  On arrival, she is unwilling to attempt to range the knee at all secondary to pain.  She provided with IM Toradol  and Percocet, pain is completely resolved and she is able to flex and extend without difficulty.  She plans to follow-up with her orthopedic specialist later today.  XR of the right knee as ordered and interpreted by myself shows arthritic changes. Agree with radiology interpretation, no significant effusion today. Do not suspect septic joint today.      Final diagnoses:  Arthritis of right knee    ED Discharge Orders     None          Dyane Broberg A, [name] 07/29/24 0603    Palumbo, April, MD 07/29/24 [id number]

## 2024-07-29 NOTE — ED Notes (Signed)
 Reviewed D/C information with the patient, pt verbalized understanding. No additional concerns at this time.

## 2024-07-29 NOTE — Discharge Instructions (Signed)
 Follow up with your orthopedic specialist

## 2024-07-29 NOTE — Patient Instructions (Signed)
 We aspirated and injected your knee today. This is consistent with an inflammatory arthropathy (like rheumatoid arthritis flare of the knee) or flare of osteoarthritis. We will contact you with the lab results. I think the aspiration/injection will help you tremendously though. Compression sleeve/ace wrap to help with the swelling. Icing 15 minutes at a time as needed. Aleve or ibuprofen  as needed.

## 2024-07-29 NOTE — ED Notes (Signed)
 Pt ambulatory with her cane to the restroom.

## 2024-07-30 NOTE — Progress Notes (Signed)
 PCP: [name], [name]  Subjective:   HPI: Patient is a 48 y.o. female here for right knee pain.  Patient denies knowing injury. She has longstanding history of rheumatoid arthritis. She has had issues with her right knee before her current pain started a few days ago and became so severe she could not bear weight on her right leg and visited the emergency department. At home she tried icing and ibuprofen . The emergency department was provided with IM Toradol  and Percocet. Some mild improvement since then. She has noted swelling but no bruising. Pain is primarily lateral and posterior.  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 and completed radiation 09-30-2012 and completed anti-estrogen therapy   Depression    Genital herpes    Hematuria    History of hyperthyroidism 02/2001   s/p  RAI 03-2001   History of panic attacks    History of radiation therapy    completed 09-23-2012  50Gy in 25 fractions and boost completed [id number]   History of thyroid  nodule    Hypertension    Renal calculus, right    Rheumatoid arthritis (HCC)    Trigeminal neuralgia of right side of face 04/15/2019   V2   Urgency of urination     Current Outpatient Medications on File Prior to Visit  Medication Sig Dispense Refill   albuterol  (PROVENTIL ) (5 MG/ML) 0.5% nebulizer solution Inhalation     albuterol  (VENTOLIN  HFA) 108 (90 Base) MCG/ACT inhaler Inhale 1-2 puffs into the lungs every 6 (six) hours as needed for wheezing or shortness of breath. 8.5 g 0   albuterol  (VENTOLIN  HFA) 108 (90 Base) MCG/ACT inhaler INHALE 1 TO 2 PUFFS BY MOUTH EVERY 6 HOURS AS NEEDED FOR WHEEZING OR SHORTNESS OF BREATH     amLODipine  (NORVASC ) 10 MG tablet Take 1 tablet (10 mg total) by mouth daily. 90 tablet 2   azithromycin  (ZITHROMAX ) 250 MG tablet Take 1 tablet (250 mg total) by  mouth daily. Take first 2 tablets together, then 1 every day until finished. 6 tablet 0   brompheniramine-pseudoephedrine -DM 30-2-10 MG/5ML syrup Take 10 mLs by mouth every 6 (six) hours as needed (cough and congestion). 120 mL 0   fluticasone  (FLONASE ) 50 MCG/ACT nasal spray Place 2 sprays into both nostrils daily. Shake well before use. Gently blow nose before spraying. Do not blow nose immediately after use. You should not taste the medication or feel it going down your throat; if you do, adjust your technique. 16 g 0   folic acid (FOLVITE) 1 MG tablet Take 1 mg by mouth daily.     gabapentin  (NEURONTIN ) 300 MG capsule Take 1 capsule (300 mg total) by mouth 2 (two) times daily. 60 capsule 5   ibuprofen  (ADVIL ) 200 MG tablet Take 200 mg by mouth every 8 (eight) hours as needed.     methotrexate (RHEUMATREX) 2.5 MG tablet Take 2.5 mg by mouth once a week.     Multiple Vitamin (MULTIVITAMIN) tablet Take 1 tablet by mouth daily.     olmesartan  (BENICAR ) 20 MG tablet Take 1 tablet (20 mg total) by mouth daily. 30 tablet 0   predniSONE  (STERAPRED UNI-PAK 21 TAB) 10 MG (21) TBPK tablet Take by mouth daily. Take 6 tabs by mouth daily  for 1 days, then 5 tabs for 1 days, then 4 tabs  for 1 days, then 3 tabs for 1 days, 2 tabs for 1 days, then 1 tab by mouth daily for 1 days 21 tablet 0   tamsulosin  (FLOMAX ) 0.4 MG CAPS capsule Take 1 capsule (0.4 mg total) by mouth daily. 5 capsule 0   valACYclovir  (VALTREX ) 1000 MG tablet Take 1 tablet (1,000 mg total) by mouth 2 (two) times daily as needed. X 5 days during an outbreak (Patient taking differently: Take 1,000 mg by mouth 2 (two) times daily as needed (for 5 days during an outbreak).) 30 tablet 2   venlafaxine  XR (EFFEXOR  XR) 150 MG 24 hr capsule Take 1 capsule (150 mg total) by mouth daily with breakfast. Take 225mg  daily (150mg  +75 mg) 90 capsule 1   venlafaxine  XR (EFFEXOR -XR) 75 MG 24 hr capsule 225 mg daily (150 mg + 75 mg daily) 90 capsule 1   No  current facility-administered medications on file prior to visit.    Past Surgical History:  Procedure Laterality Date   ABDOMINAL HYSTERECTOMY     ANTERIOR CERVICAL DECOMP/DISCECTOMY FUSION  05-18-2015   [facility]   BREAST LUMPECTOMY Left 07-03-2012    [facility]   CYSTOSCOPY WITH RETROGRADE PYELOGRAM, URETEROSCOPY AND STENT PLACEMENT Right 09/09/2017   Procedure: CYSTOSCOPY WITH RIGHT RETROGRADE PYELOGRAM, URETEROSCOPY , STONE BASKETRY AND STENT PLACEMENT;  Surgeon: Carolee Sherwood JONETTA DOUGLAS, MD;  Location: [facility];  Service: Urology;  Laterality: Right;   IR TRANSCATHETER BX  02/02/2020   IR US  GUIDE VASC ACCESS RIGHT  02/02/2020   IR VENOGRAM HEPATIC W HEMODYNAMIC EVALUATION  02/02/2020   KNEE ARTHROSCOPY Left 2009   TOTAL ABDOMINAL HYSTERECTOMY W/ BILATERAL SALPINGOOPHORECTOMY  2014    Allergies  Allergen Reactions   Shellfish Allergy Itching and Other (See Comments)    HIVES IF GOES OVERBOARD, HAS HAD IV DYE AND HAD NO PROBLEMS   Shrimp Extract Itching    HIVES IF GOES OVERBOARD, HAS HAD IV DYE AND HAD NO PROBLEMS   Bactrim [Sulfamethoxazole-Trimethoprim] Hives and Other (See Comments)   Chocolate Flavoring Agent (Non-Screening) Itching and Other (See Comments)    PLAIN CHOCOLATE IN ABUNDANCE    BP (!) 159/91   Ht 5' 3 (1.6 m)   Wt 200 lb (90.7 kg)   LMP 06/11/2013   BMI 35.43 kg/m       No data to display              No data to display              Objective:  Physical Exam:  Gen: NAD, comfortable in exam room  Right knee: No gross deformity, ecchymoses.  Moderate effusion TTP lateral joint line. ROM limited to 120 degrees flexion.  Full extension. Negative ant/post drawers. Negative valgus/varus testing. Negative lachman.  Negative mcmurrays, apleys.  NV intact distally.   Assessment & Plan:  1.  Right knee pain with effusion: We discussed the risks and benefits of aspiration and injection.  She has had this done before with an inflammatory  effusion based on analysis.  Suspect this is a rheumatoid flare specifically of her knee joint.  We did send today's fluid for cell count, crystals, and culture.  Stressed importance of compression, icing with NSAIDs as needed.  After informed written consent timeout was performed, patient was lying supine on exam table.  Right knee was prepped with alcohol swab.  Utilizing superolateral approach, 3 mL of lidocaine  was used for local anesthesia.  Then using an 18g needle on 50cc  syringe, 37 mL of cloudy straw-colored fluid was aspirated from right knee.  Knee was then injected with 3:1 lidocaine :depomedrol.  Patient tolerated procedure well without immediate complications

## 2024-07-31 LAB — SYNOVIAL FLUID, CELL COUNT

## 2024-07-31 MED ORDER — OLMESARTAN MEDOXOMIL 20 MG PO TABS: 20.0000 mg | ORAL_TABLET | Freq: Every day | ORAL | 2 refills | Status: AC

## 2024-08-01 LAB — SYNOVIAL FLUID, CELL COUNT
Eos, Fluid: 0 %
Lining Cells, Synovial: 0 %
Lymphs, Fluid: 7 %
Macrophages Fld: 6 %
Nuc cell # Fld: 6044 {cells}/uL — ABNORMAL HIGH (ref 0–200)
Polys, Fluid: 87 %

## 2024-08-04 ENCOUNTER — Ambulatory Visit: Payer: Self-pay | Admitting: Family Medicine

## 2024-08-04 LAB — BODY FLUID CULTURE

## 2024-08-20 ENCOUNTER — Other Ambulatory Visit: Payer: Self-pay

## 2024-08-20 ENCOUNTER — Encounter: Payer: Self-pay | Admitting: Family Medicine

## 2024-08-20 ENCOUNTER — Ambulatory Visit: Admitting: Family Medicine

## 2024-08-20 VITALS — BP 152/91 | Temp 98.6°F | Ht 63.0 in | Wt 200.0 lb

## 2024-08-20 DIAGNOSIS — M25562 Pain in left knee: Secondary | ICD-10-CM

## 2024-08-20 DIAGNOSIS — I10 Essential (primary) hypertension: Secondary | ICD-10-CM | POA: Diagnosis not present

## 2024-08-20 DIAGNOSIS — M25462 Effusion, left knee: Secondary | ICD-10-CM

## 2024-08-20 DIAGNOSIS — S83242A Other tear of medial meniscus, current injury, left knee, initial encounter: Secondary | ICD-10-CM | POA: Diagnosis not present

## 2024-08-20 MED ORDER — METHYLPREDNISOLONE ACETATE 40 MG/ML IJ SUSP
40.0000 mg | Freq: Once | INTRAMUSCULAR | Status: AC
  Administered 2024-08-20: 40 mg via INTRA_ARTICULAR

## 2024-08-20 NOTE — Progress Notes (Signed)
 PCP: [name], [name]  Discussed the use of AI scribe software for clinical note transcription with the patient, who gave verbal consent to proceed.   Here for left knee pain/burning.  Was last seen 9/24 at [facility] for right knee pain at which time she was suspected to have rheumatoid flare with inflammatory effusion.  Aspiration and corticosteroid injection was performed.  She was advised to follow-up with her rheumatologist.  Today she reports that since yesterday evening she has been experiencing some severe pain and burning throughout her left knee, primarily on her the lateral side into the back of her knee.  She works at [employer] and was at work all day yesterday but says it was a light day and did not notice any knee pain until the evening.  It started off as a vague burning sensation but as the night went on it got worse.    She is now unable to bear much weight on it and it is painful to extend or flex her knee.  She reports a history of lateral meniscal repair several years ago.  She denies any recent injury including twisting, falling, popping.  She does report that she felt warm last night but did not check her temperature and thought it might be more so related to her wisdom teeth coming in.  She reports taking 2 aspirin 325 mg last night which did not help much with the pain, has not taken anything else.  She has a history of rheumatoid arthritis for which she is on methotrexate and occasionally receives steroid bursts although most recently was a couple of months ago.  Today she also noticed a spot of focal swelling on the lateral side of her knee with some red borders.  This area is particularly painful.  She does not recall any tick or spider or bug bites and has not spent any time outdoors recently hiking, camping, etc.  The area has not drained or bled.  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 and completed radiation 09-30-2012 and completed anti-estrogen therapy   Depression    Genital herpes    Hematuria    History of hyperthyroidism 02/2001   s/p  RAI 03-2001   History of panic attacks    History of radiation therapy    completed 09-23-2012  50Gy in 25 fractions and boost completed [phone]   History of thyroid  nodule    Hypertension    Renal calculus, right    Rheumatoid arthritis (HCC)    Trigeminal neuralgia of right side of face 04/15/2019   V2   Urgency of urination     Current Outpatient Medications on File Prior to Visit  Medication Sig Dispense Refill   albuterol  (PROVENTIL ) (5 MG/ML) 0.5% nebulizer solution Inhalation     albuterol  (VENTOLIN  HFA) 108 (90 Base) MCG/ACT inhaler Inhale 1-2 puffs into the lungs every 6 (six) hours as needed for wheezing or shortness of breath. 8.5 g 0   albuterol  (VENTOLIN  HFA) 108 (90 Base) MCG/ACT inhaler INHALE 1 TO 2 PUFFS BY MOUTH EVERY 6 HOURS AS NEEDED FOR WHEEZING OR SHORTNESS OF BREATH     amLODipine  (NORVASC ) 10 MG tablet Take 1 tablet (10 mg total) by mouth daily. 90 tablet 2   azithromycin  (ZITHROMAX ) 250 MG tablet Take 1 tablet (250 mg total) by  mouth daily. Take first 2 tablets together, then 1 every day until finished. 6 tablet 0   brompheniramine-pseudoephedrine -DM 30-2-10 MG/5ML syrup Take 10 mLs by mouth every 6 (six) hours as needed (cough and congestion). 120 mL 0   fluticasone  (FLONASE ) 50 MCG/ACT nasal spray Place 2 sprays into both nostrils daily. Shake well before use. Gently blow nose before spraying. Do not blow nose immediately after use. You should not taste the medication or feel it going down your throat; if you do, adjust your technique. 16 g 0   folic acid (FOLVITE) 1 MG tablet Take 1 mg by mouth daily.     gabapentin  (NEURONTIN ) 300 MG capsule Take 1 capsule (300 mg total) by mouth 2 (two) times daily. 60 capsule 5   ibuprofen  (ADVIL ) 200 MG tablet  Take 200 mg by mouth every 8 (eight) hours as needed.     methotrexate (RHEUMATREX) 2.5 MG tablet Take 2.5 mg by mouth once a week.     Multiple Vitamin (MULTIVITAMIN) tablet Take 1 tablet by mouth daily.     olmesartan  (BENICAR ) 20 MG tablet Take 1 tablet (20 mg total) by mouth daily. 90 tablet 2   predniSONE  (STERAPRED UNI-PAK 21 TAB) 10 MG (21) TBPK tablet Take by mouth daily. Take 6 tabs by mouth daily  for 1 days, then 5 tabs for 1 days, then 4 tabs for 1 days, then 3 tabs for 1 days, 2 tabs for 1 days, then 1 tab by mouth daily for 1 days 21 tablet 0   tamsulosin  (FLOMAX ) 0.4 MG CAPS capsule Take 1 capsule (0.4 mg total) by mouth daily. 5 capsule 0   valACYclovir  (VALTREX ) 1000 MG tablet Take 1 tablet (1,000 mg total) by mouth 2 (two) times daily as needed. X 5 days during an outbreak (Patient taking differently: Take 1,000 mg by mouth 2 (two) times daily as needed (for 5 days during an outbreak).) 30 tablet 2   venlafaxine  XR (EFFEXOR  XR) 150 MG 24 hr capsule Take 1 capsule (150 mg total) by mouth daily with breakfast. Take 225mg  daily (150mg  +75 mg) 90 capsule 1   venlafaxine  XR (EFFEXOR -XR) 75 MG 24 hr capsule 225 mg daily (150 mg + 75 mg daily) 90 capsule 1   No current facility-administered medications on file prior to visit.    Past Surgical History:  Procedure Laterality Date   ABDOMINAL HYSTERECTOMY     ANTERIOR CERVICAL DECOMP/DISCECTOMY FUSION  05-18-2015   [facility]   BREAST LUMPECTOMY Left 07-03-2012    [facility]   CYSTOSCOPY WITH RETROGRADE PYELOGRAM, URETEROSCOPY AND STENT PLACEMENT Right 09/09/2017   Procedure: CYSTOSCOPY WITH RIGHT RETROGRADE PYELOGRAM, URETEROSCOPY , STONE BASKETRY AND STENT PLACEMENT;  Surgeon: Carolee Sherwood JONETTA DOUGLAS, MD;  Location: [facility];  Service: Urology;  Laterality: Right;   IR TRANSCATHETER BX  02/02/2020   IR US  GUIDE VASC ACCESS RIGHT  02/02/2020   IR VENOGRAM HEPATIC W HEMODYNAMIC EVALUATION  02/02/2020   KNEE ARTHROSCOPY Left 2009   TOTAL  ABDOMINAL HYSTERECTOMY W/ BILATERAL SALPINGOOPHORECTOMY  2014    Allergies  Allergen Reactions   Shellfish Allergy Itching and Other (See Comments)    HIVES IF GOES OVERBOARD, HAS HAD IV DYE AND HAD NO PROBLEMS   Shrimp Extract Itching    HIVES IF GOES OVERBOARD, HAS HAD IV DYE AND HAD NO PROBLEMS   Bactrim [Sulfamethoxazole-Trimethoprim] Hives and Other (See Comments)   Chocolate Flavoring Agent (Non-Screening) Itching and Other (See Comments)    PLAIN CHOCOLATE IN  ABUNDANCE    BP (!) 152/91   Temp 98.6 F (37 C) (Oral)   Ht 5' 3 (1.6 m)   Wt 200 lb (90.7 kg)   LMP 06/11/2013   BMI 35.43 kg/m       No data to display              No data to display              Objective:  Physical Exam:  Gen: NAD, comfortable in exam room  L knee: Inspection: No gross deformity or ecchymosis. Left knee appears swollen compared to right.  She has scars visible from her arthroscopy several years ago.  There is a roughly 3 cm well-circumscribed area of focal swelling with mildly erythematous border over the anterolateral knee. No visible bite marks or wound. Palpation: Tender to palpation along lateral joint line and posterior knee.  Nontender medially and anteriorly.  Does have some tenderness over the area of focal swelling.  Palpable effusion present anteriorly.  Palpable cyst present posteriorly.  No increased warmth. ROM: Very limited due to pain and stiffness.  With assistance, able to flex her knee to about 90 degrees and fully extend. Strength: 5/5 flexion/extension Special tests: Pain laterally with Apley's maneuver    MSK Limited knee ultrasound performed Date: 08/20/2024 Indication: Left knee pain and swelling Findings: Suprapatellar pouch visualized in long and short axis, moderate effusion was noted. Quadriceps tendon visualized in both long and short axis without abnormality. Patellar Tendon is visualized in long and short axis without abnormality. Medial  meniscus and MCL visualized with some hypoechoic changes and some fluid collection but no obvious tearing.  Medial meniscus is fragmented.  No significant vascularization noted on Doppler. Lateral meniscus and LCL visualized with some hypoechoic changes and some fluid collection and vascularization but no obvious tearing. Popliteal fossa was visualized, Baker's cyst was present.   IMPRESSION:  - Moderate knee effusion  - Baker's cyst  - Degenerative changes noted in the medial and lateral meniscus.  U/S images and interpretation completed by Rainell JAYSON Cedar, DO and Rainell Cedar, DO Images saved to PACS Assessment & Plan Acute pain of left knee Ultrasound with moderate effusion and degenerative changes of bilateral meniscus.  Differential diagnosis includes sequelae of her underlying rheumatoid arthritis, less likely to be acute traumatic injury of her meniscus (suspect changes noted on ultrasound are likely representative of her prior surgery).  Lower suspicion for septic arthritis given she is currently afebrile and no significant warmth or erythema noted. Suspect a lot of her symptoms are due to the effusion itself, for which she would benefit from aspiration and CSI.  Left knee aspiration was performed under ultrasound and tolerated well. Serosanguineous fluid was drained and appeared transudative so we did proceed with corticosteroid injection.  She tolerated the procedure well.  See below. About 15 cc serosanguineous synovial fluid was aspirated and sent for fluid analysis.  -F/u synovial fluid analysis -Obtain L knee XR to evaluate extent of likely underlying osteoarthritis -discussed management with compression, elevation, NSAIDs -Discussed return/ED precautions should she develop worsening symptoms or fevers, warmth, redness of the joint Effusion of left knee As noted above Tear of medial meniscus of left knee, unspecified tear type, unspecified whether old or current tear, initial  encounter As noted above Essential hypertension Elevated BP today - should continue BP medications as prescribed - should follow-up with PCP for further BP management  US -guided Left Knee Aspiration and corticosteroid injection: After discussion on  risks/benefits/indications was provided, informed written consent was obtained and a timeout was performed, patient was lying supine on exam table with knee bolster pillow in place. The knee was prepped with Chloraprep. Utilizing superolateral approach, 3 mL of lidocaine  1% was used for local anesthesia. Then using ultrasound guidance via a transverse-axis and in-plane approach, an 18g, 1.5 needle was inserted and 15 mL of serosanguinous fluid was aspirated from the knee joint. Utilizing the same portal and a sterile syringe swap, the knee joint was then injected under ultrasound guidance with 4:1 lidocaine :depomedrol with flow of the injectate visualized going into the knee joint.  Patient tolerated procedure well without immediate complications.  She felt immediate improvement after procedure.  Procedure completed by Payton Coward, MD and Rainell Cedar, DO

## 2024-08-20 NOTE — Patient Instructions (Signed)

## 2024-08-21 LAB — SYNOVIAL FLUID, CELL COUNT

## 2024-08-22 LAB — SYNOVIAL FLUID, CELL COUNT
Eos, Fluid: 1 %
Lining Cells, Synovial: 0 %
Lymphs, Fluid: 35 %
Macrophages Fld: 1 %
Nuc cell # Fld: 233 {cells}/uL — ABNORMAL HIGH (ref 0–200)
Polys, Fluid: 63 %
RBC, Fluid: 26000 /uL

## 2024-08-22 LAB — GRAM STAIN: Organism ID, Bacteria: NONE SEEN

## 2024-08-24 ENCOUNTER — Ambulatory Visit: Payer: Self-pay | Admitting: Family Medicine

## 2024-08-24 DIAGNOSIS — M11262 Other chondrocalcinosis, left knee: Secondary | ICD-10-CM

## 2024-08-24 NOTE — Progress Notes (Signed)
 Labs reviewed.  Knee aspirate showed calcium pyrophosphate crystals consistent with pseudogout.  No other abnormalities.  MyChart message sent.

## 2024-08-26 LAB — BODY FLUID CULTURE

## 2024-09-10 ENCOUNTER — Encounter (HOSPITAL_COMMUNITY): Payer: Self-pay

## 2024-09-10 ENCOUNTER — Other Ambulatory Visit: Payer: Self-pay

## 2024-09-10 ENCOUNTER — Emergency Department (HOSPITAL_COMMUNITY)

## 2024-09-10 ENCOUNTER — Emergency Department (HOSPITAL_COMMUNITY)
Admission: EM | Admit: 2024-09-10 | Discharge: 2024-09-10 | Disposition: A | Discharge status: Home / Self Care | Attending: Emergency Medicine | Admitting: Emergency Medicine

## 2024-09-10 DIAGNOSIS — N179 Acute kidney failure, unspecified: Secondary | ICD-10-CM | POA: Insufficient documentation

## 2024-09-10 DIAGNOSIS — Z79899 Other long term (current) drug therapy: Secondary | ICD-10-CM | POA: Insufficient documentation

## 2024-09-10 DIAGNOSIS — D649 Anemia, unspecified: Secondary | ICD-10-CM | POA: Diagnosis not present

## 2024-09-10 DIAGNOSIS — R1024 Suprapubic pain: Secondary | ICD-10-CM | POA: Diagnosis present

## 2024-09-10 DIAGNOSIS — N390 Urinary tract infection, site not specified: Secondary | ICD-10-CM | POA: Insufficient documentation

## 2024-09-10 LAB — BASIC METABOLIC PANEL WITH GFR
Anion gap: 10 (ref 5–15)
BUN: 13 mg/dL (ref 6–20)
CO2: 27 mmol/L (ref 22–32)
Calcium: 9.4 mg/dL (ref 8.9–10.3)
Chloride: 104 mmol/L (ref 98–111)
Creatinine, Ser: 1.22 mg/dL — ABNORMAL HIGH (ref 0.44–1.00)
GFR, Estimated: 54 mL/min — ABNORMAL LOW (ref >60)
Glucose, Bld: 104 mg/dL — ABNORMAL HIGH (ref 70–99)
Potassium: 3.6 mmol/L (ref 3.5–5.1)
Sodium: 141 mmol/L (ref 135–145)

## 2024-09-10 LAB — URINALYSIS, ROUTINE W REFLEX MICROSCOPIC
Bacteria, UA: NONE SEEN
Bilirubin Urine: NEGATIVE
Glucose, UA: NEGATIVE mg/dL
Hgb urine dipstick: NEGATIVE
Ketones, ur: NEGATIVE mg/dL
Nitrite: NEGATIVE
Protein, ur: NEGATIVE mg/dL
Specific Gravity, Urine: 1.016 (ref 1.005–1.030)
pH: 6 (ref 5.0–8.0)

## 2024-09-10 LAB — CBC
HCT: 37.2 % (ref 36.0–46.0)
Hemoglobin: 11.5 g/dL — ABNORMAL LOW (ref 12.0–15.0)
MCH: 27.1 pg (ref 26.0–34.0)
MCHC: 30.9 g/dL (ref 30.0–36.0)
MCV: 87.7 fL (ref 80.0–100.0)
Platelets: 231 K/uL (ref 150–400)
RBC: 4.24 MIL/uL (ref 3.87–5.11)
RDW: 14.7 % (ref 11.5–15.5)
WBC: 8.7 K/uL (ref 4.0–10.5)
nRBC: 0 % (ref 0.0–0.2)

## 2024-09-10 MED ORDER — KETOROLAC TROMETHAMINE 30 MG/ML IJ SOLN
30.0000 mg | Freq: Once | INTRAMUSCULAR | Status: AC
  Administered 2024-09-10: 30 mg via INTRAVENOUS
  Filled 2024-09-10: qty 1

## 2024-09-10 MED ORDER — SODIUM CHLORIDE 0.9 % IV BOLUS
1000.0000 mL | Freq: Once | INTRAVENOUS | Status: DC
Start: 2024-09-10 — End: 2024-09-10

## 2024-09-10 MED ORDER — CEPHALEXIN 500 MG PO CAPS: 500.0000 mg | ORAL_CAPSULE | Freq: Two times a day (BID) | ORAL | 0 refills | Status: AC

## 2024-09-10 MED ORDER — ONDANSETRON HCL 4 MG/2ML IJ SOLN
4.0000 mg | Freq: Once | INTRAMUSCULAR | Status: AC
  Administered 2024-09-10: 4 mg via INTRAVENOUS
  Filled 2024-09-10: qty 2

## 2024-09-10 MED ORDER — CEPHALEXIN 500 MG PO CAPS
500.0000 mg | ORAL_CAPSULE | Freq: Once | ORAL | Status: AC
Start: 2024-09-10 — End: 2024-09-10
  Administered 2024-09-10: 500 mg via ORAL
  Filled 2024-09-10: qty 1

## 2024-09-10 NOTE — ED Triage Notes (Addendum)
 Pt POV from home d/t lower pelvic pain for 2 days.  Hx of  kidney stones

## 2024-09-10 NOTE — Discharge Instructions (Addendum)
 Today you were seen for suprapubic pain.  I suspect this is likely from a urinary tract infection.  Please pick up your antibiotic and take as prescribed.  Please return to the ED if you have uncontrollable vomiting, worsening pain, or fever that does not go down with Tylenol  or Motrin .  Thank you for letting us  treat you today. After reviewing your labs and imaging, I feel you are safe to go home. Please follow up with your PCP in the next several days and provide them with your records from this visit. Return to the Emergency Room if pain becomes severe or symptoms worsen.

## 2024-09-10 NOTE — ED Provider Notes (Signed)
 [place] EMERGENCY DEPARTMENT AT [facility] Provider Note   CSN: [id number] Arrival date & time: 09/10/24  9982     Patient presents with: Pelvic Pain   Joanne Garrett is a 48 y.o. female past medical history significant for kidney stones presents today for suprapubic pain x 2 days.  Patient denies dysuria, hematuria, urgency, frequency, diarrhea, constipation, fever, chills, nausea, vomiting, vaginal discharge or flank pain.  Patient also denies any possibility of sexually transmitted infection.    Pelvic Pain       Prior to Admission medications   Medication Sig Start Date End Date Taking? Authorizing Provider  cephALEXin  (KEFLEX ) 500 MG capsule Take 1 capsule (500 mg total) by mouth 2 (two) times daily. 09/10/24  Yes Vanice Rappa N, [name]  amLODipine  (NORVASC ) 10 MG tablet Take 1 tablet (10 mg total) by mouth daily. 06/18/24   [name], [name]  folic acid (FOLVITE) 1 MG tablet Take 1 mg by mouth daily. 10/20/18   [provider]  gabapentin  (NEURONTIN ) 300 MG capsule Take 1 capsule (300 mg total) by mouth 2 (two) times daily. 04/29/23   Rush Nest, MD  methotrexate (RHEUMATREX) 2.5 MG tablet Take 2.5 mg by mouth once a week. 12/21/22   [provider]  Multiple Vitamin (MULTIVITAMIN) tablet Take 1 tablet by mouth daily.    [provider]  olmesartan  (BENICAR ) 20 MG tablet Take 1 tablet (20 mg total) by mouth daily. 07/31/24 04/27/25  [name], [name]  valACYclovir  (VALTREX ) 1000 MG tablet Take 1 tablet (1,000 mg total) by mouth 2 (two) times daily as needed. X 5 days during an outbreak Patient taking differently: Take 1,000 mg by mouth 2 (two) times daily as needed (for 5 days during an outbreak). 08/15/18   Henson, Vickie L, NP-C  venlafaxine  XR (EFFEXOR  XR) 150 MG 24 hr capsule Take 1 capsule (150 mg total) by mouth daily with breakfast. Take 225mg  daily (150mg  +75 mg) 05/22/24   Leavy Lucas, Fox, [name]  venlafaxine   XR (EFFEXOR -XR) 75 MG 24 hr capsule 225 mg daily (150 mg + 75 mg daily) 05/20/24   Tapia Cedeno, Jorge, [name]    Allergies: Shellfish allergy, Shrimp extract, Bactrim [sulfamethoxazole-trimethoprim], and Chocolate flavoring agent (non-screening)    Review of Systems  Genitourinary:  Positive for pelvic pain.    Updated Vital Signs BP 108/74 (BP Location: Left Arm)   Pulse 82   Temp 99.8 F (37.7 C) (Oral)   Resp 20   Ht 5' 3 (1.6 m)   Wt 95.7 kg   LMP 06/11/2013   SpO2 100%   BMI 37.38 kg/m   Physical Exam Vitals and nursing note reviewed.  Constitutional:      General: She is not in acute distress.    Appearance: She is well-developed. She is not toxic-appearing.  HENT:     Head: Normocephalic and atraumatic.     Right Ear: External ear normal.     Left Ear: External ear normal.  Eyes:     Conjunctiva/sclera: Conjunctivae normal.  Cardiovascular:     Rate and Rhythm: Normal rate and regular rhythm.     Pulses: Normal pulses.     Heart sounds: Normal heart sounds. No murmur heard. Pulmonary:     Effort: Pulmonary effort is normal. No respiratory distress.     Breath sounds: Normal breath sounds.  Abdominal:     Palpations: Abdomen is soft.     Tenderness: There is abdominal tenderness in the suprapubic area.  There is no right CVA tenderness or left CVA tenderness. Negative signs include Murphy's sign, Rovsing's sign and McBurney's sign.  Musculoskeletal:        General: No swelling.     Cervical back: Neck supple.  Skin:    General: Skin is warm and dry.     Capillary Refill: Capillary refill takes less than 2 seconds.  Neurological:     General: No focal deficit present.     Mental Status: She is alert and oriented to person, place, and time.  Psychiatric:        Mood and Affect: Mood normal.     (all labs ordered are listed, but only abnormal results are displayed) Labs Reviewed  BASIC METABOLIC PANEL WITH GFR - Abnormal; Notable for the following  components:      Result Value   Glucose, Bld 104 (*)    Creatinine, Ser 1.22 (*)    GFR, Estimated 54 (*)    All other components within normal limits  CBC - Abnormal; Notable for the following components:   Hemoglobin 11.5 (*)    All other components within normal limits  URINALYSIS, ROUTINE W REFLEX MICROSCOPIC - Abnormal; Notable for the following components:   Leukocytes,Ua TRACE (*)    All other components within normal limits  URINE CULTURE    EKG: None  Radiology: CT Renal Stone Study Result Date: 09/10/2024 CLINICAL DATA:  Lower pelvic pain for 2 days. EXAM: CT ABDOMEN AND PELVIS WITHOUT CONTRAST TECHNIQUE: Multidetector CT imaging of the abdomen and pelvis was performed following the standard protocol without IV contrast. RADIATION DOSE REDUCTION: This exam was performed according to the departmental dose-optimization program which includes automated exposure control, adjustment of the mA and/or kV according to patient size and/or use of iterative reconstruction technique. COMPARISON:  August 15, 2020 FINDINGS: Lower chest: No acute abnormality. Hepatobiliary: No focal liver abnormality is seen. No gallstones, gallbladder wall thickening, or biliary dilatation. Pancreas: Unremarkable. No pancreatic ductal dilatation or surrounding inflammatory changes. Spleen: Normal in size without focal abnormality. Adrenals/Urinary Tract: Adrenal glands are unremarkable. Kidneys are normal in size without obstructing renal calculi or hydronephrosis. A 1.8 cm cyst is seen within the medial aspect of the upper left kidney. Numerous subcentimeter non-obstructing renal calculi of various sizes are seen within both kidneys. Bladder is unremarkable. Stomach/Bowel: Stomach is within normal limits. Appendix appears normal. No evidence of bowel wall thickening, distention, or inflammatory changes. Vascular/Lymphatic: No significant vascular findings are present. No enlarged abdominal or pelvic lymph nodes.  Reproductive: Status post hysterectomy. No adnexal masses. Other: No abdominal wall hernia or abnormality. No abdominopelvic ascites. Musculoskeletal: No acute or significant osseous findings. IMPRESSION: 1. Numerous subcentimeter non-obstructing renal calculi within both kidneys. 2. 1.8 cm left renal cyst. Electronically Signed   By: Suzen Dials M.D.   On: 09/10/2024 02:15     Procedures   Medications Ordered in the ED  ketorolac  (TORADOL ) 30 MG/ML injection 30 mg (has no administration in time range)  ondansetron  (ZOFRAN ) injection 4 mg (has no administration in time range)  cephALEXin  (KEFLEX ) capsule 500 mg (has no administration in time range)    Clinical Course as of 09/10/24 0455  Thu Sep 10, 2024  0451 Agree as presented. [CC]    Clinical Course User Index [CC] Jerral Meth, MD                                 Medical  Decision Making Amount and/or Complexity of Data Reviewed Labs: ordered. Radiology: ordered.   This patient presents to the ED for concern of pelvic pain differential diagnosis includes kidney stone, UTI, pyelonephritis, gonorrhea, chlamydia, yeast infection, BV, trichomonas   Lab Tests:  I Ordered, and personally interpreted labs.  The pertinent results include: Mild AKI with creatinine at 1.22 from a baseline of approximately 0.8, mild anemia 11.5, urine with trace leukocytes, 6-10 RBCs, and 11-20 WBCs   Imaging Studies ordered:  I ordered imaging studies including CT renal stone study I independently visualized and interpreted imaging which showed Numerous subcentimeter nonobstructing renal calculi within both kidneys.  1.8 cm left renal cyst I agree with the radiologist interpretation   Medicines ordered and prescription drug management:  I ordered medication including Toradol , Zofran , Keflex     I have reviewed the patients home medicines and have made adjustments as needed   Problem List / ED Course:  Considered for admission or  further workup however patient's vital signs, physical exam, labs, and imaging are reassuring.  Patient has urine culture pending.  It is possible patient recently passed a kidney stone, however her urine is suspicious for infection and her symptoms are most consistent with UTI.  Patient being treated with outpatient course of Keflex .  Patient stable for discharge at this time.       Final diagnoses:  Suprapubic pain  Urinary tract infection with hematuria, site unspecified    ED Discharge Orders          Ordered    cephALEXin  (KEFLEX ) 500 MG capsule  2 times daily        09/10/24 0453               Francis Ileana SAILOR, [name] 09/10/24 0455    Jerral Meth, MD 09/10/24 [phone]

## 2024-09-11 LAB — URINE CULTURE: Culture: <10000 — AB

## 2024-10-06 ENCOUNTER — Telehealth: Payer: Self-pay | Admitting: Psychiatry

## 2024-10-06 NOTE — Telephone Encounter (Signed)
 Pt asked to cx due to a conflict, she declined NP's next available with wait list

## 2024-10-07 ENCOUNTER — Ambulatory Visit: Admitting: Neurology

## 2024-10-14 ENCOUNTER — Ambulatory Visit

## 2024-11-22 ENCOUNTER — Encounter (HOSPITAL_COMMUNITY): Payer: Self-pay

## 2024-11-22 ENCOUNTER — Ambulatory Visit (INDEPENDENT_AMBULATORY_CARE_PROVIDER_SITE_OTHER)

## 2024-11-22 ENCOUNTER — Ambulatory Visit (HOSPITAL_COMMUNITY): Admission: EM | Admit: 2024-11-22 | Discharge: 2024-11-22 | Disposition: A | Discharge status: Home / Self Care

## 2024-11-22 DIAGNOSIS — M1711 Unilateral primary osteoarthritis, right knee: Secondary | ICD-10-CM | POA: Diagnosis not present

## 2024-11-22 DIAGNOSIS — M25561 Pain in right knee: Secondary | ICD-10-CM | POA: Diagnosis not present

## 2024-11-22 MED ORDER — KETOROLAC TROMETHAMINE 30 MG/ML IJ SOLN
30.0000 mg | Freq: Once | INTRAMUSCULAR | Status: AC
  Administered 2024-11-22: 30 mg via INTRAMUSCULAR

## 2024-11-22 MED ORDER — KETOROLAC TROMETHAMINE 30 MG/ML IJ SOLN
INTRAMUSCULAR | Status: AC
  Filled 2024-11-22: qty 1

## 2024-11-22 MED ORDER — CYCLOBENZAPRINE HCL 10 MG PO TABS: 10.0000 mg | ORAL_TABLET | Freq: Two times a day (BID) | ORAL | 0 refills | Status: AC | PRN

## 2024-11-22 MED ORDER — DICLOFENAC SODIUM 50 MG PO TBEC: 50.0000 mg | DELAYED_RELEASE_TABLET | Freq: Two times a day (BID) | ORAL | 1 refills | Status: AC

## 2024-11-22 NOTE — Discharge Instructions (Signed)
" °  1. Arthritis of right knee (Primary) - DG Knee Complete 4 Views Right x-ray performed in UC shows no acute fracture or dislocation to the right knee, moderate degenerative changes with mild knee effusion. - ketorolac  (TORADOL ) 30 MG/ML injection 30 mg given in UC for acute pain to right knee and right lower back - Crutches provided for patient for ambulation due to right knee pain and swelling. - diclofenac  (VOLTAREN ) 50 MG EC tablet; Take 1 tablet (50 mg total) by mouth 2 (two) times daily.  Dispense: 30 tablet; Refill: 1 - cyclobenzaprine  (FLEXERIL ) 10 MG tablet; Take 1 tablet (10 mg total) by mouth 2 (two) times daily as needed for muscle spasms.  Dispense: 20 tablet; Refill: 0 - AMB referral to orthopedics for follow-up evaluation and management of right knee swelling and effusion.  -Continue to monitor symptoms for any change in severity if there is any escalation of current symptoms or development of new symptoms follow-up in ER for further evaluation and management. "

## 2024-11-22 NOTE — ED Provider Notes (Signed)
 " [facility] [place]  Note:  This document was prepared using Dragon voice recognition software and may include unintentional dictation errors.  MRN: [id number] DOB: [birth year]  Subjective:   Joanne Garrett is a 49 y.o. female presenting for right knee pain and swelling for the last 2 to 3 days.  Patient reports increased pain with ambulation and bending of knee.  Patient has been taking ibuprofen , Tylenol , aspirin with minimal improvement.  Denies any known injury or trauma.  Patient reports that she has had previous issues with fluid within her knee that need to be drawn off by orthopedics.  Patient reports the pain as a burning sensation in her knee joint.  Patient is also having right sided lower back/gluteal pain with radiation down the back of her leg, consistent with previous history of sciatica.  Patient denies any numbness or tingling in the lower extremity.  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 and completed radiation 09-30-2012 and completed anti-estrogen therapy   Depression    Genital herpes    Hematuria    History of hyperthyroidism 02/2001   s/p  RAI 03-2001   History of panic attacks    History of radiation therapy    completed 09-23-2012  50Gy in 25 fractions and boost completed [phone]   History of thyroid  nodule    Hypertension    Renal calculus, right    Rheumatoid arthritis (HCC)    Trigeminal neuralgia of right side of face 04/15/2019   V2   Urgency of urination      Past Surgical History:  Procedure Laterality Date   ABDOMINAL HYSTERECTOMY     ANTERIOR CERVICAL DECOMP/DISCECTOMY FUSION  05-18-2015   [facility]   BREAST LUMPECTOMY Left 07-03-2012    [facility]   CYSTOSCOPY WITH RETROGRADE PYELOGRAM, URETEROSCOPY AND STENT PLACEMENT Right 09/09/2017   Procedure: CYSTOSCOPY WITH  RIGHT RETROGRADE PYELOGRAM, URETEROSCOPY , STONE BASKETRY AND STENT PLACEMENT;  Surgeon: Carolee Sherwood JONETTA DOUGLAS, MD;  Location: [facility];  Service: Urology;  Laterality: Right;   IR TRANSCATHETER BX  02/02/2020   IR US  GUIDE VASC ACCESS RIGHT  02/02/2020   IR VENOGRAM HEPATIC W HEMODYNAMIC EVALUATION  02/02/2020   KNEE ARTHROSCOPY Left 2009   TOTAL ABDOMINAL HYSTERECTOMY W/ BILATERAL SALPINGOOPHORECTOMY  2014    Family History  Problem Relation Age of Onset   Hypertension Mother    COPD Mother    Depression Mother    Hypertension Father    Kidney disease Father    Alcohol abuse Father    Hypertension Brother    Alzheimer's disease Paternal Grandmother     Social History[3]  ROS Refer to HPI for ROS details.  Objective:    Vitals: BP 131/84 (BP Location: Left Arm)   Pulse 79   Temp 98.6 F (37 C) (Oral)   Resp 16   LMP 06/11/2013   SpO2 95%   Physical Exam Vitals and nursing note reviewed.  Constitutional:      General: She is not in acute distress.    Appearance: Normal appearance. She is well-developed. She is not ill-appearing or toxic-appearing.  HENT:     Head: Normocephalic and atraumatic.  Cardiovascular:     Rate and Rhythm: Normal rate.  Pulmonary:     Effort: Pulmonary effort is normal. No respiratory  distress.     Breath sounds: No stridor. No wheezing.  Musculoskeletal:     Lumbar back: Spasms and tenderness present. No swelling, deformity or bony tenderness. Normal range of motion. Positive right straight leg raise test. Negative left straight leg raise test.     Right knee: Swelling present. No deformity, erythema, bony tenderness or crepitus. Decreased range of motion. Tenderness present. Normal pulse.  Skin:    General: Skin is warm and dry.  Neurological:     General: No focal deficit present.     Mental Status: She is alert and oriented to person, place, and time.  Psychiatric:        Mood and Affect: Mood normal.        Behavior:  Behavior normal.     Procedures  No results found for this or any previous visit (from the past 24 hours).  Assessment and Plan :     Discharge Instructions       1. Arthritis of right knee (Primary) - DG Knee Complete 4 Views Right x-ray performed in UC shows no acute fracture or dislocation to the right knee, moderate degenerative changes with mild knee effusion. - ketorolac  (TORADOL ) 30 MG/ML injection 30 mg given in UC for acute pain to right knee and right lower back - Crutches provided for patient for ambulation due to right knee pain and swelling. - diclofenac  (VOLTAREN ) 50 MG EC tablet; Take 1 tablet (50 mg total) by mouth 2 (two) times daily.  Dispense: 30 tablet; Refill: 1 - cyclobenzaprine  (FLEXERIL ) 10 MG tablet; Take 1 tablet (10 mg total) by mouth 2 (two) times daily as needed for muscle spasms.  Dispense: 20 tablet; Refill: 0 - AMB referral to orthopedics for follow-up evaluation and management of right knee swelling and effusion.  -Continue to monitor symptoms for any change in severity if there is any escalation of current symptoms or development of new symptoms follow-up in ER for further evaluation and management.      Juanetta Negash B Jesusa Stenerson    [1] No current facility-administered medications for this encounter.  Current Outpatient Medications:    amLODipine  (NORVASC ) 10 MG tablet, Take 1 tablet (10 mg total) by mouth daily., Disp: 90 tablet, Rfl: 2   cyclobenzaprine  (FLEXERIL ) 10 MG tablet, Take 1 tablet (10 mg total) by mouth 2 (two) times daily as needed for muscle spasms., Disp: 20 tablet, Rfl: 0   diclofenac  (VOLTAREN ) 50 MG EC tablet, Take 1 tablet (50 mg total) by mouth 2 (two) times daily., Disp: 30 tablet, Rfl: 1   methotrexate (RHEUMATREX) 2.5 MG tablet, Take 2.5 mg by mouth once a week., Disp: , Rfl:    Multiple Vitamin (MULTIVITAMIN) tablet, Take 1 tablet by mouth daily., Disp: , Rfl:    olmesartan  (BENICAR ) 20 MG tablet, Take 1 tablet (20 mg total)  by mouth daily., Disp: 90 tablet, Rfl: 2   valACYclovir  (VALTREX ) 1000 MG tablet, Take 1 tablet (1,000 mg total) by mouth 2 (two) times daily as needed. X 5 days during an outbreak (Patient taking differently: Take 1,000 mg by mouth 2 (two) times daily as needed (for 5 days during an outbreak).), Disp: 30 tablet, Rfl: 2   venlafaxine  XR (EFFEXOR  XR) 150 MG 24 hr capsule, Take 1 capsule (150 mg total) by mouth daily with breakfast. Take 225mg  daily (150mg  +75 mg), Disp: 90 capsule, Rfl: 1   venlafaxine  XR (EFFEXOR -XR) 75 MG 24 hr capsule, 225 mg daily (150 mg + 75 mg daily), Disp: 90  capsule, Rfl: 1   cephALEXin  (KEFLEX ) 500 MG capsule, Take 1 capsule (500 mg total) by mouth 2 (two) times daily., Disp: 14 capsule, Rfl: 0   folic acid (FOLVITE) 1 MG tablet, Take 1 mg by mouth daily., Disp: , Rfl:    gabapentin  (NEURONTIN ) 300 MG capsule, Take 1 capsule (300 mg total) by mouth 2 (two) times daily., Disp: 60 capsule, Rfl: 5 [2]  Allergies Allergen Reactions   Shellfish Allergy Itching and Other (See Comments)    HIVES IF GOES OVERBOARD, HAS HAD IV DYE AND HAD NO PROBLEMS   Shrimp Extract Itching    HIVES IF GOES OVERBOARD, HAS HAD IV DYE AND HAD NO PROBLEMS   Bactrim [Sulfamethoxazole-Trimethoprim] Hives and Other (See Comments)   Chocolate Flavoring Agent (Non-Screening) Itching and Other (See Comments)    PLAIN CHOCOLATE IN ABUNDANCE  [3]  Social History Tobacco Use   Smoking status: Never   Smokeless tobacco: Never  Vaping Use   Vaping status: Never Used  Substance Use Topics   Alcohol use: Yes    Alcohol/week: 1.0 standard drink of alcohol    Types: 1 Glasses of wine per week    Comment: occ   Drug use: No     Aurea Goodell B, NP 11/22/24 1710  "

## 2024-11-22 NOTE — ED Triage Notes (Addendum)
 Patient presents for evaluation of (R) knee pain. Pt reports swelling, inability to bear weight or bend knee. Patient took Ibuprofen  at 5 am, Tylenol  at 6 am, and two aspirin at 12 pm. Denies any injury or fall. Pt reports that she had pain in this knee before and had to see sports medicine to have fluid drawn off. Pt reports that this pain is burning. Pt reports that she was walking at work started to feel burning back pain all the way down to her knee. Denies any numbness or tingling in lower extremities. Pt was assisted to room in wheelchair.

## 2024-11-24 ENCOUNTER — Ambulatory Visit: Admitting: Family Medicine

## 2024-11-24 ENCOUNTER — Other Ambulatory Visit: Payer: Self-pay

## 2024-11-24 ENCOUNTER — Encounter: Payer: Self-pay | Admitting: Family Medicine

## 2024-11-24 VITALS — BP 160/82 | Ht 63.0 in | Wt 206.0 lb

## 2024-11-24 DIAGNOSIS — M059 Rheumatoid arthritis with rheumatoid factor, unspecified: Secondary | ICD-10-CM

## 2024-11-24 DIAGNOSIS — I10 Essential (primary) hypertension: Secondary | ICD-10-CM | POA: Diagnosis not present

## 2024-11-24 DIAGNOSIS — M25461 Effusion, right knee: Secondary | ICD-10-CM | POA: Diagnosis not present

## 2024-11-24 DIAGNOSIS — M25561 Pain in right knee: Secondary | ICD-10-CM

## 2024-11-24 MED ORDER — METHYLPREDNISOLONE ACETATE 40 MG/ML IJ SUSP
40.0000 mg | Freq: Once | INTRAMUSCULAR | Status: AC
  Administered 2024-11-24: 40 mg via INTRA_ARTICULAR

## 2024-11-24 NOTE — Patient Instructions (Signed)

## 2024-11-24 NOTE — Progress Notes (Addendum)
 " PCP: Joanne Lucas Fox, [name]  Subjective:   HPI: Patient is a 49 y.o. female here for right knee pain and swelling.  Patient has a pertinent medical history of rheumatoid arthritis and pseudogout of the left knee.  Currently being treated on methotrexate.    She states that 2 days ago she had sudden swelling of her right knee.  She denies any injury, trauma or fall.  She went to the urgent care who obtained x-ray imaging which did show a moderate right knee effusion as well as mild degenerative changes of the knee.  She is prescribed a muscle relaxer as well as diclofenac  which she states been helpful with her pain.  States that her pain is a throbbing sensation.  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 and completed radiation 09-30-2012 and completed anti-estrogen therapy   Depression    Genital herpes    Hematuria    History of hyperthyroidism 02/2001   s/p  RAI 03-2001   History of panic attacks    History of radiation therapy    completed 09-23-2012  50Gy in 25 fractions and boost completed [phone]   History of thyroid  nodule    Hypertension    Renal calculus, right    Rheumatoid arthritis (HCC)    Trigeminal neuralgia of right side of face 04/15/2019   V2   Urgency of urination     Medications Ordered Prior to Encounter[1]  BP (!) 160/82   Ht 5' 3 (1.6 m)   Wt 206 lb (93.4 kg)   LMP 06/11/2013   BMI 36.49 kg/m        Objective:   Physical Exam:  Gen: NAD, comfortable in exam room Right knee Inspection: Moderate effusion, no erythema or warmth Palpation: No significant tenderness palpation ROM: Decreased extension to about 10 degrees, limited flexion about 90 degrees due to increased pressure and pain Special Tests: Ballottement test positive, negative valgus and varus testing Neuro: Neurovascular intact  distally  IMAGING: Right knee x-ray 11/22/2024 at urgent care personally reviewed and interpreted by me and Dr. Teressa today showing: - Moderate effusion - Mild medial and lateral joint space narrowing  Limited ultrasound of the right knee: Date: 11/24/2024 Indication: Right knee pain and swelling Findings: - Large amount of hypoechoic fluid in the suprapatellar pouch - No other abnormality detected  IMPRESSION: - large knee effusion Images and interpretation completed by Krystal Lowing DO and Rainell Teressa, DO      Assessment/Plan:   Joanne Garrett is a 49 y.o. female who was seen today for the following: 1. Effusion of right knee (Primary) - US  LIMITED JOINT SPACE STRUCTURES LOW RIGHT; Future - methylPREDNISolone  acetate (DEPO-MEDROL ) injection 40 mg  2. Acute pain of right knee  3. Rheumatoid arthritis with positive rheumatoid factor, involving unspecified site (HCC)  4. Essential hypertension  Patient's right knee effusion could be due to a rheumatoid flare versus pseudogout -Urgent care notes and imaging from 11/22/2024 reviewed during the visit today - Discussed aspirating the knee as well as injection of steroid to decrease inflammation - Patient was agreeable to this plan - She tolerated the procedure well - Should continue her medications as prescribed by her rheumatologist.  Can also continue diclofenac  50 mg p.o. twice daily with food - She does have a follow-up with a rheumatologist in the  next few weeks, should keep this appointment as scheduled - Should follow-up with us  in 4 to 6 weeks if no improvement, sooner as needed - Regarding her elevated blood pressure, should continue her current blood pressure medications and follow-up with PCP as directed   Procedure: Ultrasound guided aspiration/injection of the right knee Indications: pain 2/2 effusion Patient: Joanne Garrett 01-30-1976 Procedure date: 11/24/2024  Risks, benefits, and alternatives were  discussed with the patient.  Verbal and written, informed consent obtained.  Time-out conducted.  Noted no overlying erythema, induration, or other signs of local infection.  Skin prepped in a sterile fashion using Chloraprep.  Local anesthesia: Topical Ethyl chloride and 4 cc of 1% lidocaine  w/out epi Anesthetic Needle: 25 ga 1 1/2 A wheal was injected in subcutaneous fashion and then a tract was injected using the lateral suprapatellar approach to the joint capsul Aspirate needle: 18 gauge 1 1/2 The same tract was then followed while aspirating until the joint space was reached Aspirate: 31 cc of clear, yellow synovial fluid, was not sent for analysis Injection: 3/1 mixture of Lidocaine  1% w/out epi and Depo-Medrol  40 mg/mL Using careful sterile technique the syringe was replaced with the 18ga needle still in place and the joint space was injected.   Sterile 2x2 gauze w/ bandage was placed.   The procedure was completed without difficulty. Patient tolerated well and no immediate complications or after 5 minutes of monitoring.  Fluid will not be sent for culture given no concern for infection. The patient reported improvement of her symptoms suggesting accurate placement of the medication.  Advised to monitor for and call if fevers/chills, erythema, warmth, induration, drainage, or persistent bleeding.  Post procedural instructions were discussed and printed material given.   Follow-up/Education:   Return if symptoms worsen or fail to improve.   May return sooner as needed and encouraged to call/e-mail for additional questions or  worsening symptoms in the interim.  Krystal Lowing, DO Sports Medicine Fellow 11/24/2024 5:02 PM     [1]  Current Outpatient Medications on File Prior to Visit  Medication Sig Dispense Refill   amLODipine  (NORVASC ) 10 MG tablet Take 1 tablet (10 mg total) by mouth daily. 90 tablet 2   cephALEXin  (KEFLEX ) 500 MG capsule Take 1 capsule (500 mg total) by  mouth 2 (two) times daily. 14 capsule 0   cyclobenzaprine  (FLEXERIL ) 10 MG tablet Take 1 tablet (10 mg total) by mouth 2 (two) times daily as needed for muscle spasms. 20 tablet 0   diclofenac  (VOLTAREN ) 50 MG EC tablet Take 1 tablet (50 mg total) by mouth 2 (two) times daily. 30 tablet 1   folic acid (FOLVITE) 1 MG tablet Take 1 mg by mouth daily.     gabapentin  (NEURONTIN ) 300 MG capsule Take 1 capsule (300 mg total) by mouth 2 (two) times daily. 60 capsule 5   methotrexate (RHEUMATREX) 2.5 MG tablet Take 2.5 mg by mouth once a week.     Multiple Vitamin (MULTIVITAMIN) tablet Take 1 tablet by mouth daily.     olmesartan  (BENICAR ) 20 MG tablet Take 1 tablet (20 mg total) by mouth daily. 90 tablet 2   valACYclovir  (VALTREX ) 1000 MG tablet Take 1 tablet (1,000 mg total) by mouth 2 (two) times daily as needed. X 5 days during an outbreak (Patient taking differently: Take 1,000 mg by mouth 2 (two) times daily as needed (for 5 days during an outbreak).) 30 tablet 2   venlafaxine  XR (EFFEXOR  XR) 150 MG 24 hr  capsule Take 1 capsule (150 mg total) by mouth daily with breakfast. Take 225mg  daily (150mg  +75 mg) 90 capsule 1   venlafaxine  XR (EFFEXOR -XR) 75 MG 24 hr capsule 225 mg daily (150 mg + 75 mg daily) 90 capsule 1   No current facility-administered medications on file prior to visit.   "

## 2024-11-26 ENCOUNTER — Encounter: Payer: Self-pay | Admitting: Internal Medicine

## 2024-11-26 ENCOUNTER — Ambulatory Visit: Attending: Internal Medicine | Admitting: Internal Medicine

## 2024-11-26 VITALS — BP 161/103 | HR 82 | Temp 97.5°F | Resp 16 | Ht 64.5 in | Wt 200.8 lb

## 2024-11-26 DIAGNOSIS — E785 Hyperlipidemia, unspecified: Secondary | ICD-10-CM

## 2024-11-26 DIAGNOSIS — M1712 Unilateral primary osteoarthritis, left knee: Secondary | ICD-10-CM | POA: Diagnosis not present

## 2024-11-26 DIAGNOSIS — Z79899 Other long term (current) drug therapy: Secondary | ICD-10-CM

## 2024-11-26 DIAGNOSIS — F419 Anxiety disorder, unspecified: Secondary | ICD-10-CM

## 2024-11-26 DIAGNOSIS — M059 Rheumatoid arthritis with rheumatoid factor, unspecified: Secondary | ICD-10-CM

## 2024-11-26 DIAGNOSIS — Z8639 Personal history of other endocrine, nutritional and metabolic disease: Secondary | ICD-10-CM

## 2024-11-26 NOTE — Assessment & Plan Note (Addendum)
 No serious interval infections. Has bene tolerating medication well but with incomplete disease control. - Checking CBC and CMP for medication monitoring on methotrexate and inflectra infusions. Orders:   CBC with Differential/Platelet   Comprehensive metabolic panel with GFR   QuantiFERON-TB Gold Plus   Lipid panel

## 2024-11-26 NOTE — Assessment & Plan Note (Deleted)
 Joanne Garrett

## 2024-11-26 NOTE — Assessment & Plan Note (Addendum)
 Chronic seropositive rheumatoid arthritis with stiffness and pain between Inflectra infusions, indicating potential loss of effectiveness. Considering alternative treatments due to persistent symptoms and frequent joint injections. - Ordered blood work to assess current status and guide treatment adjustments. - Provided information on alternative medications such as Actemra, Rinvoq, and Xeljanz. - Continue methotrexate 15 mg PO weekly with infusions to prevent antibody formation and infusion reactions. - Will schedule follow-up appointment to discuss treatment options and adjustments. Orders:   Sedimentation rate   C-reactive protein   Rheumatoid factor

## 2024-11-26 NOTE — Patient Instructions (Signed)
        Supplements for Osteoarthritis  Natural anti-inflammatories can help reduce inflammation and joint stiffness without some of the harmful side effects of non-steroidal anti-inflammatories (Advil, Motrin, Aleve , etc)  Recommend starting with one supplement and give a 3-4 week trial period before adding another  You may be able to find some of these products at your local pharmacy, but may also purchase at [employer], other specialty stores, or online   Turmeric Recommended dose 400mg  once a day May increase to twice a day if tolerated (may cause stomach upset) Do not take if you are on a blood thinner, and stop prior to surgery   Ginger (root or capsules) Recommended dose is 2 grams twice daily, or 2 cups of tea daily Do not take if you are on a blood thinner, and stop prior to surgery   Fish Oil or Omega 3 Recommended dose for capsule is 2 grams twice daily (make sure it contains at least 30% of EPA/DHA) For food, two 3-ounce servings of fish a week, or flaxseed, chia seeds, walnuts, and almonds   Tart Cherry (dried, extract, or tablets) Recommended dose is 500mg  once a day   *Although these are natural products, they can still interact with medications. Always consult with your doctor or pharmacist when starting new supplements and/or medications*  *Patents should be under the care of a physician or other medical provide while taking these supplements*

## 2024-11-30 LAB — COMPREHENSIVE METABOLIC PANEL WITH GFR
AG Ratio: 1.2 (calc) (ref 1.0–2.5)
ALT: 18 U/L (ref 6–29)
AST: 15 U/L (ref 10–35)
Albumin: 4.2 g/dL (ref 3.6–5.1)
Alkaline phosphatase (APISO): 64 U/L (ref 31–125)
BUN: 16 mg/dL (ref 7–25)
CO2: 31 mmol/L (ref 20–32)
Calcium: 9.5 mg/dL (ref 8.6–10.2)
Chloride: 105 mmol/L (ref 98–110)
Creat: 0.51 mg/dL (ref 0.50–0.99)
Globulin: 3.4 g/dL (ref 1.9–3.7)
Glucose, Bld: 89 mg/dL (ref 65–99)
Potassium: 4 mmol/L (ref 3.5–5.3)
Sodium: 142 mmol/L (ref 135–146)
Total Bilirubin: 0.2 mg/dL (ref 0.2–1.2)
Total Protein: 7.6 g/dL (ref 6.1–8.1)
eGFR: 114 mL/min/{1.73_m2}

## 2024-11-30 LAB — CBC WITH DIFFERENTIAL/PLATELET
Absolute Lymphocytes: 3621 {cells}/uL (ref 850–3900)
Absolute Monocytes: 533 {cells}/uL (ref 200–950)
Basophils Absolute: 52 {cells}/uL (ref 0–200)
Basophils Relative: 0.6 %
Eosinophils Absolute: 103 {cells}/uL (ref 15–500)
Eosinophils Relative: 1.2 %
HCT: 37.8 % (ref 35.9–46.0)
Hemoglobin: 12 g/dL (ref 11.7–15.5)
MCH: 26.7 pg — ABNORMAL LOW (ref 27.0–33.0)
MCHC: 31.7 g/dL (ref 31.6–35.4)
MCV: 84.2 fL (ref 81.4–101.7)
MPV: 11 fL (ref 7.5–12.5)
Monocytes Relative: 6.2 %
Neutro Abs: 4291 {cells}/uL (ref 1500–7800)
Neutrophils Relative %: 49.9 %
Platelets: 300 10*3/uL (ref 140–400)
RBC: 4.49 Million/uL (ref 3.80–5.10)
RDW: 13.8 % (ref 11.0–15.0)
Total Lymphocyte: 42.1 %
WBC: 8.6 10*3/uL (ref 3.8–10.8)

## 2024-11-30 LAB — LIPID PANEL
Cholesterol: 225 mg/dL — ABNORMAL HIGH
HDL: 90 mg/dL
LDL Cholesterol (Calc): 114 mg/dL — ABNORMAL HIGH
Non-HDL Cholesterol (Calc): 135 mg/dL — ABNORMAL HIGH
Total CHOL/HDL Ratio: 2.5 (calc)
Triglycerides: 99 mg/dL

## 2024-11-30 LAB — QUANTIFERON-TB GOLD PLUS
Mitogen-NIL: 7.76 [IU]/mL
NIL: 0.01 [IU]/mL
QuantiFERON-TB Gold Plus: NEGATIVE
TB1-NIL: 0 [IU]/mL
TB2-NIL: 0 [IU]/mL

## 2024-11-30 LAB — C-REACTIVE PROTEIN: CRP: 24.3 mg/L — ABNORMAL HIGH

## 2024-11-30 LAB — SEDIMENTATION RATE: Sed Rate: 19 mm/h (ref 0–20)

## 2024-11-30 LAB — RHEUMATOID FACTOR: Rheumatoid fact SerPl-aCnc: 90 [IU]/mL — ABNORMAL HIGH

## 2024-12-09 NOTE — Progress Notes (Unsigned)
 "  Office Visit Note  Patient: Joanne Garrett             Date of Birth: 03-18-1976           MRN: [id number]             Visit Date: 12/10/2024  PCP: [name], [name]   Subjective:   Chief Complaint: No chief complaint on file.  Discussed the use of AI scribe software for clinical note transcription with the patient, who gave verbal consent to proceed.  History of Present Illness Joanne Garrett is a 49 year old female with rheumatoid arthritis who presents for follow-up regarding her current treatment regimen.  She is currently taking methotrexate at a dose of 12.5 mg per week. The medication is working 'pretty good' compared to previous treatments she has tried.  She has not required prednisone  for almost a year since starting a new infusion therapy.  No new side effects from either the infusion therapy or methotrexate. She has not noticed any side effects that have been previously mentioned as possibilities.    History of Present Illness: Joanne Garrett is a 49 y.o. female who returns today for follow up of seropositive rheumatoid arthritis. She is feeling ***. She is taking oral methotrexate, Tylenol , Aleve as needed, and diclofenac  for acute inflammation.  She is tolerating medications without any side effects.   Otherwise, patient has *** had any other significant health changes since last visit.   Previous Medication Trials: oral methotrexate, methotrexate injections, Enbrel, Humira, Inflectra, Simponi  Aria   Last Labs: 11/27/2023 - CRP 24.3, RF 90, CBC/CMP WNL  Review of Systems: Review of Systems  Constitutional:  Positive for fatigue.  HENT:  Negative for mouth sores and mouth dryness.   Eyes:  Negative for dryness.  Respiratory:  Negative for shortness of breath.   Cardiovascular:  Negative for chest pain and palpitations.  Gastrointestinal:  Negative for blood in stool, constipation and diarrhea.  Endocrine: Negative for increased urination.   Genitourinary:  Negative for involuntary urination.  Musculoskeletal:  Positive for joint pain, joint pain and morning stiffness. Negative for gait problem, joint swelling, myalgias, muscle weakness, muscle tenderness and myalgias.  Skin:  Negative for color change, rash, hair loss and sensitivity to sunlight.  Allergic/Immunologic: Negative for susceptible to infections.  Neurological:  Negative for dizziness and headaches.  Hematological:  Negative for swollen glands.  Psychiatric/Behavioral:  Positive for depressed mood and sleep disturbance. The patient is nervous/anxious.     Medication List: Current Medications[1]   Allergies:  Shellfish allergy, Shrimp extract, Bactrim [sulfamethoxazole-trimethoprim], and Chocolate flavoring agent (non-screening)  Immunization status:  Immunization History  Administered Date(s) Administered   Influenza Inj Mdck Quad Pf 09/11/2019, 08/29/2022   Influenza Split 10/05/2010   Influenza, Quadrivalent, Recombinant, Inj, Pf 08/21/2018, 09/11/2019   Influenza-Unspecified 08/21/2018   MMR 11/20/1977   PFIZER(Purple Top)SARS-COV-2 Vaccination 01/15/2020, 02/09/2020, 10/02/2020   PPD Test 11/26/2013   Pfizer(Comirnaty)Fall Seasonal Vaccine 12 years and older 08/29/2022   Pneumococcal Polysaccharide-23 07/17/2013   Polio, Unspecified 06/07/1975, 08/16/1975, 10/18/1975, 11/20/1977   Rabies, IM 06/02/2017, 06/05/2017, 06/09/2017, 06/16/2017   Td (Adult),5 Lf Tetanus Toxid, Preservative Free 10/05/2010   Tdap 10/05/2010, 05/31/2017   Tetanus 06/07/1975, 08/16/1975, 10/18/1975, 11/20/1977   Unspecified SARS-COV-2 Vaccination 01/15/2020, 02/09/2020, 01/02/2021     Problem List:  Patient Active Problem List   Diagnosis Date Noted   Obstructive sleep apnea syndrome 12/12/2023   Immunodeficiency due to drugs 04/17/2023   Primary osteoarthritis of left knee  06/26/2022   Anxiety 05/31/2022   Hyperlipidemia 05/31/2022   Arthralgia of both knees  12/16/2021   Headache 06/13/2020   Weakness 05/31/2020   Herpes simplex 01/06/2020   Snoring 05/20/2019   Insomnia 05/20/2019   Trigeminal neuralgia of right side of face 04/15/2019   Pain in left foot 02/04/2019   MDD (major depressive disorder), recurrent, in partial remission 01/22/2019   Pain in finger of right hand 11/13/2018   Pain of cervical spine 08/22/2018   Neck pain 08/12/2018   Obesity (BMI 30.0-34.9) 01/31/2018   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   Pain in joint of right shoulder 01/27/2018   Personal history of kidney stones 10/16/2017   PTSD (post-traumatic stress disorder) 09/17/2017   MDD (major depressive disorder), recurrent episode, moderate (HCC) 09/17/2017   Rheumatoid arthritis involving multiple sites with positive rheumatoid factor (HCC) 04/05/2017   History of total hysterectomy 04/05/2017   HTN (hypertension) 01/16/2017   Genital herpes 01/16/2017   History of thyroid  disorder 01/16/2017   Left thyroid  nodule 01/16/2017   Breast pain, left 01/16/2017   History of breast cancer 01/16/2017   Low serum vitamin B12 04/11/2016   Abnormal mammogram 08/31/2014   Allergic rhinitis due to pollen 08/31/2014   Fibroids 08/31/2014   Heavy menstrual bleeding 08/31/2014   Hematuria 08/31/2014   Infiltrating ductal carcinoma of breast (HCC) 08/31/2014   Irregular uterine bleeding 08/31/2014   Mastitis in female 08/31/2014   Pain in joint, pelvic region and thigh 08/31/2014   Pain in joint of left shoulder 08/31/2014   Pain in joint involving multiple sites 08/31/2014   Pelvic and perineal pain 08/31/2014   Plantar fasciitis 08/31/2014   Sensorineural hearing loss of both ears 08/31/2014   Encounter for long-term (current) use of high-risk medication 05/26/2014   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    History: 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[facility]) onologist-- dr sorscher w/ [facility]   dx 2013 ---   DCIS --  s/p  left lumpectomy with negative margins and completed radiation 09-30-2012 and completed anti-estrogen therapy   Carpal tunnel syndrome 08/31/2014   Positive Phalen's testing on left     Depression    Genital herpes    Hematuria    History of hyperthyroidism 02/2001   s/p  RAI 03-2001   History of panic attacks    History of radiation therapy    completed 09-23-2012  50Gy in 25 fractions and boost completed [phone]   History of thyroid  nodule    Hypertension    Renal calculus, right    Rheumatoid arthritis (HCC)    Trigeminal neuralgia of right side of face 04/15/2019   V2   Urgency of urination     Family History  Problem Relation Age of Onset   Hypertension Mother    COPD Mother    Depression Mother    Hypertension Father    Kidney disease Father    Alcohol abuse Father    Healthy Sister    Hypertension Brother    Alzheimer's disease Paternal Grandmother    Past Surgical History:  Procedure Laterality Date   ABDOMINAL HYSTERECTOMY     ANTERIOR CERVICAL DECOMP/DISCECTOMY FUSION  05-18-2015   [facility]   BREAST LUMPECTOMY Left 07-03-2012    [facility]   CYSTOSCOPY WITH RETROGRADE PYELOGRAM, URETEROSCOPY AND STENT PLACEMENT Right 09/09/2017   Procedure: CYSTOSCOPY WITH RIGHT RETROGRADE PYELOGRAM, URETEROSCOPY , STONE BASKETRY AND STENT PLACEMENT;  Surgeon: Carolee,  Sherwood JONETTA MOULD, MD;  Location: [facility];  Service: Urology;  Laterality: Right;   IR TRANSCATHETER BX  02/02/2020   IR US  GUIDE VASC ACCESS RIGHT  02/02/2020   IR VENOGRAM HEPATIC W HEMODYNAMIC EVALUATION  02/02/2020   KNEE ARTHROSCOPY Left 2009   TOTAL ABDOMINAL HYSTERECTOMY W/ BILATERAL SALPINGOOPHORECTOMY  2014   Social History   Social History Narrative   Not on file    Objective: Vital Signs: LMP 06/11/2013   Physical Exam Constitutional:      General: She is not in acute distress.    Appearance: Normal appearance.  HENT:     Head: Normocephalic and atraumatic.  Eyes:      General: Lids are normal. No scleral icterus.    Conjunctiva/sclera: Conjunctivae normal.     Pupils: Pupils are equal, round, and reactive to light.  Pulmonary:     Effort: Pulmonary effort is normal.  Skin:    General: Skin is warm.     Findings: No rash.  Neurological:     Mental Status: She is alert.  Psychiatric:        Mood and Affect: Mood normal.        Behavior: Behavior normal. Behavior is cooperative.      Imaging: No results found.   Assessment & Plan Rheumatoid arthritis with positive rheumatoid factor, involving unspecified site [facility]) Chronic seropositive rheumatoid arthritis with stiffness and pain between Inflectra infusions, indicating potential loss of effectiveness. Considering alternative treatments due to persistent symptoms and frequent joint injections.  alternative medications such as Actemra, Rinvoq, and Xeljanz.      Encounter for long-term (current) use of high-risk medication      Follow-Up Instructions:  No follow-ups on file.    Procedures: No procedures performed  Daved GORMAN Holstein, [name]  Note - This record has been created using [employer]. Chart creation errors have been sought, but may not always have been located. Such creation errors do not reflect on the standard of medical care.      [1]  Current Outpatient Medications:    amLODipine  (NORVASC ) 10 MG tablet, Take 1 tablet (10 mg total) by mouth daily., Disp: 90 tablet, Rfl: 2   cephALEXin  (KEFLEX ) 500 MG capsule, Take 1 capsule (500 mg total) by mouth 2 (two) times daily. (Patient not taking: Reported on 11/26/2024), Disp: 14 capsule, Rfl: 0   cyclobenzaprine  (FLEXERIL ) 10 MG tablet, Take 1 tablet (10 mg total) by mouth 2 (two) times daily as needed for muscle spasms., Disp: 20 tablet, Rfl: 0   diclofenac  (VOLTAREN ) 50 MG EC tablet, Take 1 tablet (50 mg total) by mouth 2 (two) times daily., Disp: 30 tablet, Rfl: 1   folic acid (FOLVITE) 1 MG tablet, Take 1 mg by mouth daily.,  Disp: , Rfl:    gabapentin  (NEURONTIN ) 300 MG capsule, Take 1 capsule (300 mg total) by mouth 2 (two) times daily. (Patient not taking: Reported on 11/26/2024), Disp: 60 capsule, Rfl: 5   methotrexate (RHEUMATREX) 2.5 MG tablet, Take 2.5 mg by mouth once a week., Disp: , Rfl:    Multiple Vitamin (MULTIVITAMIN) tablet, Take 1 tablet by mouth daily., Disp: , Rfl:    olmesartan  (BENICAR ) 20 MG tablet, Take 1 tablet (20 mg total) by mouth daily., Disp: 90 tablet, Rfl: 2   valACYclovir  (VALTREX ) 1000 MG tablet, Take 1 tablet (1,000 mg total) by mouth 2 (two) times daily as needed. X 5 days during an outbreak (Patient taking differently: Take 1,000 mg by  mouth 2 (two) times daily as needed (for 5 days during an outbreak).), Disp: 30 tablet, Rfl: 2   venlafaxine  XR (EFFEXOR  XR) 150 MG 24 hr capsule, Take 1 capsule (150 mg total) by mouth daily with breakfast. Take 225mg  daily (150mg  +75 mg), Disp: 90 capsule, Rfl: 1   venlafaxine  XR (EFFEXOR -XR) 75 MG 24 hr capsule, 225 mg daily (150 mg + 75 mg daily), Disp: 90 capsule, Rfl: 1  "

## 2024-12-09 NOTE — Assessment & Plan Note (Signed)
 SABRA

## 2024-12-10 ENCOUNTER — Ambulatory Visit

## 2024-12-10 VITALS — BP 146/85 | HR 89 | Temp 98.0°F | Resp 14 | Ht 64.5 in | Wt 199.0 lb

## 2024-12-10 DIAGNOSIS — Z79899 Other long term (current) drug therapy: Secondary | ICD-10-CM

## 2024-12-10 DIAGNOSIS — M059 Rheumatoid arthritis with rheumatoid factor, unspecified: Secondary | ICD-10-CM

## 2025-01-13 ENCOUNTER — Ambulatory Visit: Payer: Self-pay

## 2025-03-09 ENCOUNTER — Ambulatory Visit
